# Patient Record
Sex: Female | Born: 1975 | Race: White | Hispanic: No | Marital: Married | Smoking: Former smoker
Health system: Southern US, Community
[De-identification: ages and names within clinical notes are randomized; demographics above are authoritative.]

## PROBLEM LIST (undated history)

## (undated) DIAGNOSIS — I5020 Unspecified systolic (congestive) heart failure: Secondary | ICD-10-CM

## (undated) DIAGNOSIS — F32A Depression, unspecified: Secondary | ICD-10-CM

## (undated) DIAGNOSIS — I471 Supraventricular tachycardia, unspecified: Secondary | ICD-10-CM

## (undated) DIAGNOSIS — F909 Attention-deficit hyperactivity disorder, unspecified type: Secondary | ICD-10-CM

## (undated) DIAGNOSIS — T7840XA Allergy, unspecified, initial encounter: Secondary | ICD-10-CM

## (undated) DIAGNOSIS — E119 Type 2 diabetes mellitus without complications: Secondary | ICD-10-CM

## (undated) DIAGNOSIS — F419 Anxiety disorder, unspecified: Secondary | ICD-10-CM

## (undated) DIAGNOSIS — F329 Major depressive disorder, single episode, unspecified: Secondary | ICD-10-CM

## (undated) DIAGNOSIS — I509 Heart failure, unspecified: Secondary | ICD-10-CM

## (undated) DIAGNOSIS — I10 Essential (primary) hypertension: Secondary | ICD-10-CM

## (undated) HISTORY — DX: Allergy, unspecified, initial encounter: T78.40XA

## (undated) HISTORY — DX: Unspecified systolic (congestive) heart failure: I50.20

## (undated) HISTORY — DX: Supraventricular tachycardia: I47.1

## (undated) HISTORY — DX: Anxiety disorder, unspecified: F41.9

## (undated) HISTORY — DX: Type 2 diabetes mellitus without complications: E11.9

## (undated) HISTORY — DX: Essential (primary) hypertension: I10

## (undated) HISTORY — DX: Attention-deficit hyperactivity disorder, unspecified type: F90.9

## (undated) HISTORY — DX: Depression, unspecified: F32.A

## (undated) HISTORY — DX: Major depressive disorder, single episode, unspecified: F32.9

---

## 2004-04-25 ENCOUNTER — Other Ambulatory Visit: Admission: RE | Admit: 2004-04-25 | Discharge: 2004-04-25 | Payer: Self-pay | Admitting: Gynecology

## 2004-11-06 ENCOUNTER — Encounter (INDEPENDENT_AMBULATORY_CARE_PROVIDER_SITE_OTHER): Payer: Self-pay | Admitting: Specialist

## 2004-11-06 ENCOUNTER — Inpatient Hospital Stay (HOSPITAL_COMMUNITY): Admission: AD | Admit: 2004-11-06 | Discharge: 2004-11-08 | Payer: Self-pay | Admitting: Gynecology

## 2005-01-03 ENCOUNTER — Other Ambulatory Visit: Admission: RE | Admit: 2005-01-03 | Discharge: 2005-01-03 | Payer: Self-pay | Admitting: Gynecology

## 2007-03-20 ENCOUNTER — Emergency Department (HOSPITAL_COMMUNITY): Admission: EM | Admit: 2007-03-20 | Discharge: 2007-03-20 | Payer: Self-pay | Admitting: Family Medicine

## 2012-01-13 ENCOUNTER — Ambulatory Visit: Payer: Self-pay | Admitting: Family Medicine

## 2012-01-13 VITALS — BP 133/81 | HR 67 | Temp 97.9°F | Resp 16 | Ht 59.58 in | Wt 188.6 lb

## 2012-01-13 DIAGNOSIS — H00039 Abscess of eyelid unspecified eye, unspecified eyelid: Secondary | ICD-10-CM

## 2012-01-13 DIAGNOSIS — H5789 Other specified disorders of eye and adnexa: Secondary | ICD-10-CM

## 2012-01-13 MED ORDER — SULFAMETHOXAZOLE-TRIMETHOPRIM 800-160 MG PO TABS: 1.0000 | ORAL_TABLET | Freq: Two times a day (BID) | ORAL | Status: DC

## 2012-01-13 NOTE — Progress Notes (Signed)
[facility]:  Office Visit  Chief Complaint:  Chief Complaint  Patient presents with  . Eye Problem    x 3 days  swelling/redness/pain/drainage/crusting in the am.   claritin x 2 days    HPI: Vickie Graham is a 36 y.o. female who complains of 3 day history of right eye redness of the upper eyelid only, swelling of upper eyelid, and soreness of eyelid. 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 She does have a h/o sinusitis and allergies. Took claritin without releif. SHe tried warm compresses without relief. There has been no drainage, she denies any fevers, chills, sinus tenderness, ear pain or signs of URI . She has full eye movement, no orbital or inner eye pain, no changes in vision, no photophobia or HAs.   Past Medical History  Diagnosis Date  . Allergy    History reviewed. No pertinent past surgical history. History   Social History  . Marital Status: Legally Separated    Spouse Name: N/A    Number of Children: N/A  . Years of Education: N/A   Social History Main Topics  . Smoking status: Former Smoker    Quit date: 01/12/2010  . Smokeless tobacco: None  . Alcohol Use: No  . Drug Use: No  . Sexually Active: None   Other Topics Concern  . None   Social History Narrative  . None   No family history on file. No Known Allergies Prior to Admission medications   Not on File     ROS: The patient denies fevers, chills, night sweats, unintentional weight loss, chest pain, palpitations, wheezing, dyspnea on exertion, nausea, vomiting, abdominal pain, dysuria, hematuria, melena, numbness, weakness, or tingling. + eye swelling  All other systems have been reviewed and were otherwise negative with the exception of those mentioned in the HPI and as above.    PHYSICAL EXAM: Filed Vitals:   01/13/12 1000  BP: 133/81  Pulse: 67  Temp: 97.9 F (36.6 C)  Resp: 16   Filed Vitals:   01/13/12 1000  Height: 4' 11.58" (1.513 m)  Weight: 188  lb 9.6 oz (85.548 kg)   Body mass index is 37.35 kg/(m^2).  General: Alert, no acute distress HEENT:  Normocephalic, atraumatic, oropharynx patent. EOMI, PERRLA, fundoscopic exam is normal, + red, swollen upper right eyelid, mildly tender Cardiovascular:  Regular rate and rhythm, no rubs murmurs or gallops.  No Carotid bruits, radial pulse intact. No pedal edema.  Respiratory: Clear to auscultation bilaterally.  No wheezes, rales, or rhonchi.  No cyanosis, no use of accessory musculature GI: No organomegaly, abdomen is soft and non-tender, positive bowel sounds.  No masses. Skin: No rashes. Neurologic: Facial musculature symmetric. Psychiatric: Patient is appropriate throughout our interaction. Lymphatic: No cervical lymphadenopathy Musculoskeletal: Gait intact.   LABS: No results found for this or any previous visit.   EKG/XRAY:   Primary read interpreted by Dr. Conley Rolls at [facility].   ASSESSMENT/PLAN: Encounter Diagnoses  Name Primary?  . Eyelid cellulitis Yes  . Eye swelling, right     ? Allergies vs  Less likely peri-orbital cellulitis Fluoroscein exam nl, opthalmic ofloxacin given at time of eye exam, flushed with sterile saline Patient advise to take claritin, Bactrim was given for potential cellulitis F/u in 1 day if no improvement Patient was advised that this is an emergency case if she starts having worsening redness, pain, loss/change in vision   Jaszmine Navejas PHUONG, DO 01/13/2012 10:42 AM

## 2012-01-14 ENCOUNTER — Telehealth: Payer: Self-pay | Admitting: Family Medicine

## 2012-01-14 NOTE — Telephone Encounter (Signed)
Patient is doing about the same, no changes. No fevers, chill, increase redness, pain or dx or vision changes. Will start on antihistamine eye drops OTC.

## 2013-05-28 ENCOUNTER — Ambulatory Visit: Payer: Self-pay | Admitting: Physician Assistant

## 2013-05-28 ENCOUNTER — Encounter: Payer: Self-pay | Admitting: Physician Assistant

## 2013-05-28 VITALS — BP 118/70 | HR 60 | Temp 98.1°F | Resp 16 | Ht 59.5 in | Wt 193.4 lb

## 2013-05-28 DIAGNOSIS — Z0289 Encounter for other administrative examinations: Secondary | ICD-10-CM

## 2013-05-28 NOTE — Progress Notes (Signed)
  Subjective:    Patient ID: Vickie Graham, female    DOB: 1975/11/19, 37 y.o.   MRN: [id number]  HPI 37 year old female presents for employment physical and TB read.  Had TB test placed at [facility] on 05/26/13 at 6:30 p.m.  Was unable to get it read tonight at the [facility] due to the fact they were closing.  She is healthy with no known medical problems.  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  She is not on any daily medications and has not under treatment for any physical or emotional conditions. Last tetanus 8 years ago.   Patient is otherwise doing well with no other concerns today.      Review of Systems  HENT: Negative.   Respiratory: Negative.   Cardiovascular: Negative.   Gastrointestinal: Negative.   Endocrine: Negative.   Genitourinary: Negative.   Neurological: Negative.        Objective:   Physical Exam  Constitutional: She is oriented to person, place, and time. She appears well-developed and well-nourished.  HENT:  Head: Normocephalic and atraumatic.  Right Ear: Hearing, tympanic membrane, external ear and ear canal normal.  Left Ear: Hearing, tympanic membrane, external ear and ear canal normal.  Mouth/Throat: Uvula is midline, oropharynx is clear and moist and mucous membranes are normal. No oropharyngeal exudate.  Eyes: Conjunctivae and EOM are normal. Pupils are equal, round, and reactive to light.  Neck: Normal range of motion. Neck supple. No thyromegaly present.  Cardiovascular: Normal rate, regular rhythm and normal heart sounds.   Pulmonary/Chest: Effort normal and breath sounds normal.  Abdominal: Soft. Bowel sounds are normal. There is no tenderness.  Musculoskeletal:       Right shoulder: Normal.       Left shoulder: Normal.       Right knee: Normal.       Left knee: Normal.       Thoracic back: Normal.       Lumbar back: Normal.  Lymphadenopathy:    She has no cervical adenopathy.  Neurological: She  is alert and oriented to person, place, and time.  Reflex Scores:      Patellar reflexes are 2+ on the right side and 2+ on the left side. Psychiatric: She has a normal mood and affect. Her behavior is normal. Judgment and thought content normal.          Assessment & Plan:  Health examination of defined subpopulation Patient healthy and able to care for children.  Forms completed.  TB test read as negative at 0.00 mm  Follow up as needed.

## 2013-07-26 ENCOUNTER — Ambulatory Visit: Payer: Self-pay | Admitting: Internal Medicine

## 2013-07-26 VITALS — BP 116/74 | HR 54 | Temp 97.5°F | Resp 16 | Ht 61.0 in | Wt 195.4 lb

## 2013-07-26 DIAGNOSIS — R059 Cough, unspecified: Secondary | ICD-10-CM

## 2013-07-26 DIAGNOSIS — J209 Acute bronchitis, unspecified: Secondary | ICD-10-CM

## 2013-07-26 DIAGNOSIS — R05 Cough: Secondary | ICD-10-CM

## 2013-07-26 MED ORDER — AZITHROMYCIN 250 MG PO TABS: ORAL_TABLET | ORAL | Status: DC

## 2013-07-26 MED ORDER — HYDROCODONE-HOMATROPINE 5-1.5 MG/5ML PO SYRP: 5.0000 mL | ORAL_SOLUTION | Freq: Four times a day (QID) | ORAL | Status: DC | PRN

## 2013-07-26 NOTE — Progress Notes (Signed)
  Subjective:    Patient ID: ALEJANDRO ADCOX, female    DOB: 1976/04/14, 37 y.o.   MRN: [id number]  HPI Productive cough for 1 week No fever//?low grade at night No AR sxt Fatigue Never wheezer   Review of Systems     Objective:   Physical Exam BP 116/74  Pulse 54  Temp(Src) 97.5 F (36.4 C) (Oral)  Resp 16  Ht 5\' 1"  (1.549 m)  Wt 195 lb 6.4 oz (88.633 kg)  BMI 36.94 kg/m2  SpO2 96%  LMP 07/20/2013 Perrla/conj sl inj Nares boggy/no disch thr sl red no exud No nodes Lungs with rhonchi bilat/clear w/ cough-no wheeze       Assessment & Plan:  Cough 2 to LRI  Meds ordered this encounter  Medications  . HYDROcodone-homatropine (HYCODAN) 5-1.5 MG/5ML syrup    Sig: Take 5 mLs by mouth every 6 (six) hours as needed for cough.    Dispense:  120 mL    Refill:  0  . azithromycin (ZITHROMAX) 250 MG tablet    Sig: As packaged    Dispense:  6 tablet    Refill:  0

## 2013-12-13 ENCOUNTER — Ambulatory Visit: Payer: Self-pay | Admitting: Internal Medicine

## 2013-12-13 VITALS — BP 112/66 | HR 84 | Temp 97.6°F | Resp 16 | Ht 60.0 in | Wt 206.0 lb

## 2013-12-13 DIAGNOSIS — R5383 Other fatigue: Principal | ICD-10-CM

## 2013-12-13 DIAGNOSIS — R5381 Other malaise: Secondary | ICD-10-CM

## 2013-12-13 DIAGNOSIS — G4700 Insomnia, unspecified: Secondary | ICD-10-CM

## 2013-12-13 LAB — POCT UA - MICROSCOPIC ONLY
Casts, Ur, LPF, POC: NEGATIVE
Crystals, Ur, HPF, POC: NEGATIVE
Mucus, UA: NEGATIVE
RBC, URINE, MICROSCOPIC: NEGATIVE
Yeast, UA: NEGATIVE

## 2013-12-13 LAB — COMPREHENSIVE METABOLIC PANEL
ALBUMIN: 3.9 g/dL (ref 3.5–5.2)
ALT: 16 U/L (ref 0–35)
AST: 15 U/L (ref 0–37)
Alkaline Phosphatase: 70 U/L (ref 39–117)
BUN: 19 mg/dL (ref 6–23)
CO2: 26 meq/L (ref 19–32)
Calcium: 9.5 mg/dL (ref 8.4–10.5)
Chloride: 105 mEq/L (ref 96–112)
Creat: 0.86 mg/dL (ref 0.50–1.10)
GLUCOSE: 89 mg/dL (ref 70–99)
POTASSIUM: 4.6 meq/L (ref 3.5–5.3)
SODIUM: 141 meq/L (ref 135–145)
TOTAL PROTEIN: 6.5 g/dL (ref 6.0–8.3)
Total Bilirubin: 0.4 mg/dL (ref 0.2–1.2)

## 2013-12-13 LAB — POCT CBC
GRANULOCYTE PERCENT: 66.1 % (ref 37–80)
HCT, POC: 46.9 % (ref 37.7–47.9)
HEMOGLOBIN: 15 g/dL (ref 12.2–16.2)
Lymph, poc: 2.9 (ref 0.6–3.4)
MCH: 28.6 pg (ref 27–31.2)
MCHC: 32 g/dL (ref 31.8–35.4)
MCV: 89.4 fL (ref 80–97)
MID (CBC): 0.6 (ref 0–0.9)
MPV: 13.6 fL (ref 0–99.8)
PLATELET COUNT, POC: 188 10*3/uL (ref 142–424)
POC Granulocyte: 6.9 (ref 2–6.9)
POC LYMPH PERCENT: 28 %L (ref 10–50)
POC MID %: 5.9 %M (ref 0–12)
RBC: 5.25 M/uL (ref 4.04–5.48)
RDW, POC: 14.8 %
WBC: 10.4 10*3/uL — AB (ref 4.6–10.2)

## 2013-12-13 LAB — POCT URINALYSIS DIPSTICK
BILIRUBIN UA: NEGATIVE
GLUCOSE UA: NEGATIVE
Leukocytes, UA: NEGATIVE
NITRITE UA: NEGATIVE
Protein, UA: NEGATIVE
RBC UA: NEGATIVE
SPEC GRAV UA: 1.025
UROBILINOGEN UA: 0.2
pH, UA: 5.5

## 2013-12-13 LAB — TSH: TSH: 2.166 u[IU]/mL (ref 0.350–4.500)

## 2013-12-13 NOTE — Progress Notes (Signed)
Subjective:  This chart was scribed for Tami Lin, MD by Roxan Diesel, Scribe.  This patient was seen in [facility] and the patient's care was started at 2:34 PM.   Patient ID: Vickie Graham, female    DOB: 03-05-1976, 38 y.o.   MRN: [id number]  HPI  HPI Comments: Vickie Graham is a 38 y.o. female who presents to [facility] complaining of fatigue that began one week ago.  Pt states she has been sleeping a large amount (14 hours last night) but is persistently very tired during the day.  She denies any other recent illness or associated symptoms including cold symptoms, cough, sore throat, dental problems, rash, leg swelling, appetite change, night sweats, SOB, or changes to bladder or bowel function. No snoring or sleep dysfunction. No daytime hypersomnolence.  Pt has had no lab work done in some time.  She denies h/o thyroid disorder.  She takes no medications regularly.    LNMP was 1 week ago and was normal.  She states the first 2 days of her periods are "pretty heavy" but it then slows down.  She admits to h/o post-partum anemia but no other anemia.  She is not sexually active.  She admits to h/o environmental allergy symptoms in the spring but has had none so far this year.  Pt works in childcare.   There are no active problems to display for this patient.   Past Medical History  Diagnosis Date   Allergy     History reviewed. No pertinent past surgical history.   Prior to Admission medications   Not on File      Review of Systems  Constitutional: Positive for fatigue. Negative for fever, chills, diaphoresis, activity change and appetite change.       Has continued to work in [place]: Negative for dental problem, mouth sores, rhinorrhea, sore throat and trouble swallowing.   Eyes: Negative for discharge.  Respiratory: Negative for cough, shortness of breath and wheezing.   Cardiovascular: Negative for chest pain, palpitations and leg swelling.    Gastrointestinal: Negative for nausea, vomiting and diarrhea.       One episode of dyspepsia one week ago without nausea or vomiting Resolve without treatment  Endocrine: Negative for cold intolerance and heat intolerance.  Genitourinary: Negative for dysuria, urgency, frequency, hematuria, decreased urine volume, enuresis, difficulty urinating and menstrual problem.  Musculoskeletal: Negative for back pain, joint swelling and myalgias.  Skin: Negative for rash.  Neurological: Negative for headaches.  Psychiatric/Behavioral: Negative for sleep disturbance.         Objective:   Physical Exam  Nursing note and vitals reviewed. Constitutional: She is oriented to person, place, and time. She appears well-developed and well-nourished. No distress.  HENT:  Head: Normocephalic and atraumatic.  Mouth/Throat: Oropharynx is clear and moist. Normal dentition. No dental abscesses. No oropharyngeal exudate, posterior oropharyngeal edema or posterior oropharyngeal erythema.  Eyes: Conjunctivae and EOM are normal. Pupils are equal, round, and reactive to light.  Neck: Neck supple. No tracheal deviation present. No thyromegaly present.  Cardiovascular: Normal rate, regular rhythm and normal heart sounds.   No murmur heard. Pulmonary/Chest: Effort normal and breath sounds normal. No respiratory distress. She has no wheezes.  Abdominal: Soft. There is no splenomegaly or hepatomegaly. There is no tenderness.  Musculoskeletal: Normal range of motion.  Lymphadenopathy:    She has no cervical adenopathy.  Neurological: She is alert and oriented to person, place, and time.  Skin: Skin is  warm and dry.  Psychiatric: She has a normal mood and affect. Her behavior is normal.     BP 112/66   Pulse 84   Temp(Src) 97.6 F (36.4 C)   Resp 16   Ht 5' (1.524 m)   Wt 206 lb (93.441 kg)   BMI 40.23 kg/m2   SpO2 96%   LMP 12/05/2013   Results for orders placed in visit on 12/13/13  POCT CBC      Result  Value Ref Range   WBC 10.4 (*) 4.6 - 10.2 K/uL   Lymph, poc 2.9  0.6 - 3.4   POC LYMPH PERCENT 28.0  10 - 50 %L   MID (cbc) 0.6  0 - 0.9   POC MID % 5.9  0 - 12 %M   POC Granulocyte 6.9  2 - 6.9   Granulocyte percent 66.1  37 - 80 %G   RBC 5.25  4.04 - 5.48 M/uL   Hemoglobin 15.0  12.2 - 16.2 g/dL   HCT, POC 46.9  37.7 - 47.9 %   MCV 89.4  80 - 97 fL   MCH, POC 28.6  27 - 31.2 pg   MCHC 32.0  31.8 - 35.4 g/dL   RDW, POC 14.8     Platelet Count, POC 188  142 - 424 K/uL   MPV 13.6  0 - 99.8 fL  POCT URINALYSIS DIPSTICK      Result Value Ref Range   Color, UA bright yellow     Clarity, UA clear     Glucose, UA neg     Bilirubin, UA neg     Ketones, UA trace     Spec Grav, UA 1.025     Blood, UA neg     pH, UA 5.5     Protein, UA neg     Urobilinogen, UA 0.2     Nitrite, UA neg     Leukocytes, UA Negative    POCT UA - MICROSCOPIC ONLY      Result Value Ref Range   WBC, Ur, HPF, POC 0-1     RBC, urine, microscopic neg     Bacteria, U Microscopic trace     Mucus, UA neg     Epithelial cells, urine per micros 0-1     Crystals, Ur, HPF, POC neg     Casts, Ur, LPF, POC neg     Yeast, UA neg           Assessment & Plan:  Other malaise and fatigue - Plan: POCT CBC, POCT urinalysis dipstick, POCT UA - Microscopic Only, Comprehensive metabolic panel, TSH  Severe obesity (BMI >= 40)    Call w/ other labs and plan--if all okay we'll wait 3 weeks before further workup  I have completed the patient encounter in its entirety as documented by the scribe, with editing by me where necessary. Robert P. Laney Pastor, M.D.

## 2014-02-04 ENCOUNTER — Ambulatory Visit: Payer: Self-pay

## 2014-02-06 ENCOUNTER — Ambulatory Visit: Payer: Self-pay | Admitting: Family Medicine

## 2014-02-06 VITALS — BP 126/78 | HR 79 | Temp 98.0°F | Resp 14 | Ht 59.5 in | Wt 211.6 lb

## 2014-02-06 DIAGNOSIS — I499 Cardiac arrhythmia, unspecified: Secondary | ICD-10-CM

## 2014-02-06 DIAGNOSIS — R609 Edema, unspecified: Secondary | ICD-10-CM

## 2014-02-06 DIAGNOSIS — R5381 Other malaise: Secondary | ICD-10-CM

## 2014-02-06 DIAGNOSIS — R5383 Other fatigue: Secondary | ICD-10-CM

## 2014-02-06 DIAGNOSIS — D72829 Elevated white blood cell count, unspecified: Secondary | ICD-10-CM

## 2014-02-06 DIAGNOSIS — R601 Generalized edema: Secondary | ICD-10-CM

## 2014-02-06 LAB — POCT URINALYSIS DIPSTICK
Bilirubin, UA: NEGATIVE
Blood, UA: NEGATIVE
Glucose, UA: NEGATIVE
Ketones, UA: 40
LEUKOCYTES UA: NEGATIVE
Nitrite, UA: NEGATIVE
PH UA: 5
PROTEIN UA: NEGATIVE
Spec Grav, UA: 1.015
Urobilinogen, UA: 0.2

## 2014-02-06 LAB — POCT CBC
Granulocyte percent: 65.9 %G (ref 37–80)
HEMATOCRIT: 46.5 % (ref 37.7–47.9)
HEMOGLOBIN: 15.1 g/dL (ref 12.2–16.2)
LYMPH, POC: 3 (ref 0.6–3.4)
MCH: 28.8 pg (ref 27–31.2)
MCHC: 32.5 g/dL (ref 31.8–35.4)
MCV: 88.6 fL (ref 80–97)
MID (cbc): 0.9 (ref 0–0.9)
MPV: 10.7 fL (ref 0–99.8)
POC Granulocyte: 7.6 — AB (ref 2–6.9)
POC LYMPH PERCENT: 26.5 %L (ref 10–50)
POC MID %: 7.6 %M (ref 0–12)
Platelet Count, POC: 240 10*3/uL (ref 142–424)
RBC: 5.25 M/uL (ref 4.04–5.48)
RDW, POC: 16.1 %
WBC: 11.5 10*3/uL — AB (ref 4.6–10.2)

## 2014-02-06 MED ORDER — FUROSEMIDE 20 MG PO TABS: 20.0000 mg | ORAL_TABLET | Freq: Every day | ORAL | Status: DC

## 2014-02-06 NOTE — Patient Instructions (Addendum)
Your blood count was borderline elevated, but no apparent signs of infection otherwise. If you have any fever, chest pain, cough, or new urinary symptoms - return for recheck here or emergency room. EKG had a few extra beats, but no apparent concerning findings at this time. You have some other blood tests pending. You should receive a call or letter about your lab results within the next week to 10 days.   For now - make sure you are staying hydrated throughout the day, low salt diet (including frozen foods and restaurant foods), and recheck in next week. Lasix for next 2 days may help to lessen swelling. Return sooner if any worsening of the swelling, any heart palpitations, dizziness, or any new/worsening symptoms. Return to the clinic or go to the nearest emergency room if any of your symptoms worsen or new symptoms occur.    Peripheral Edema You have swelling in your legs (peripheral edema). This swelling is due to excess accumulation of salt and water in your body. Edema may be a sign of heart, kidney or liver disease, or a side effect of a medication. It may also be due to problems in the leg veins. Elevating your legs and using special support stockings may be very helpful, if the cause of the swelling is due to poor venous circulation. Avoid long periods of standing, whatever the cause. Treatment of edema depends on identifying the cause. Chips, pretzels, pickles and other salty foods should be avoided. Restricting salt in your diet is almost always needed. Water pills (diuretics) are often used to remove the excess salt and water from your body via urine. These medicines prevent the kidney from reabsorbing sodium. This increases urine flow. Diuretic treatment may also result in lowering of potassium levels in your body. Potassium supplements may be needed if you have to use diuretics daily. Daily weights can help you keep track of your progress in clearing your edema. You should call your caregiver  for follow up care as recommended. SEEK IMMEDIATE MEDICAL CARE IF:   You have increased swelling, pain, redness, or heat in your legs.  You develop shortness of breath, especially when lying down.  You develop chest or abdominal pain, weakness, or fainting.  You have a fever. Document Released: 10/11/2004 Document Revised: 11/26/2011 Document Reviewed: 09/21/2009 [facility] Patient Information 2014 [place].

## 2014-02-06 NOTE — Progress Notes (Addendum)
Subjective:    Patient ID: Vickie Graham, female    DOB: 02-May-1976, 38 y.o.   MRN: [id number]  This chart was scribed for Merri Ray, MD by Erling Conte, Medical Scribe. This patient was seen in Room 1 and the patient's care was started at 4:36 PM.  Chief Complaint  Patient presents with   Edema    fingers, knees, ankles, wrists, shoulders x 1 week      HPI  HPI Comments:  Vickie Graham is a 38 y.o. female who presents to the [facility] complaining of swelling in her fingers, knees, ankles, wrists and shoulders for one week. Patient states that swelling started in her fingers and has gradually progressed to other areas of the body. She reports that the swelling in her fingers became so bad she could no longer wear her rings. She states she has associated soreness in her left shoulder and knee pain, she also states she had some fatigue initially but that has resolved. She reports that she was riding her bike about 2 weeks ago when she developed some knee pain, she stated that the knee pain continued until she began having the swelling 1 week ago.  Patient states she has never had this problem before. She denies any history of heart problems. Patient reports that she takes OTC magnesium, fish oil, Vitamin E and chromium picolinate supplements. 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 Patient denies any swelling in her face, chest pain, chest tightness, dysuria, diffculty urinating or shortness of breath.  Weight is up 5lbs from last visit  No change in diet. States she has been on a [employer] for 5 years. Stopped drinking coffee 1 year ago  Last seen by Dr. Laney Pastor on 12/13/13, Fatigue noted at that time but no swelling. UA was negative for protein. TSH normal. CMP normal. CBC normal except borderline WBC at 10.4   Patient Active Problem List   Diagnosis Date  Noted   Severe obesity (BMI >= 40) 12/13/2013   Past Medical History  Diagnosis Date   Allergy    History reviewed. No pertinent past surgical history. No Known Allergies Prior to Admission medications   Not on File   History   Social History   Marital Status: Legally Separated    Spouse Name: N/A    Number of Children: N/A   Years of Education: N/A   Occupational History   Not on file.   Social History Main Topics   Smoking status: Former Smoker    Quit date: 01/12/2010   Smokeless tobacco: Not on file   Alcohol Use: No   Drug Use: No   Sexual Activity: Not on file   Other Topics Concern   Not on file   Social History Narrative   No narrative on file       Review of Systems  Constitutional: Positive for fatigue (resolved).  HENT: Negative for facial swelling.   Respiratory: Negative for chest tightness and shortness of breath.   Cardiovascular: Positive for leg swelling. Negative for chest pain.  Genitourinary: Negative for dysuria and difficulty urinating.  Musculoskeletal: Positive for arthralgias (bilateral knees and left shoulders) and joint swelling (fingers, knees, wrists).  All other systems reviewed and are negative.      Objective:   Physical Exam  Nursing note and vitals reviewed. Constitutional: She is  oriented to person, place, and time. She appears well-developed and well-nourished. No distress.  HENT:  Head: Normocephalic and atraumatic.  Eyes: EOM are normal.  Neck: Neck supple. No tracheal deviation present.  Cardiovascular: Exam reveals no gallop.   No murmur heard. Every 3-6 beats she has a PVC, followed by slight pause. Heart sounds are distant but no apparent gallop or murmur Peripheral pulses are equal and intact  Pulmonary/Chest: Effort normal. No respiratory distress.  Musculoskeletal: Normal range of motion. She exhibits edema.  1+ edema to superior tibia bilaterally Calves are non tender  Neurological: She is  alert and oriented to person, place, and time.  Skin: Skin is warm and dry.  Psychiatric: She has a normal mood and affect. Her behavior is normal.    Filed Vitals:   02/06/14 1531  BP: 126/78  Pulse: 79  Temp: 98 F (36.7 C)  TempSrc: Oral  Resp: 14  Height: 4' 11.5" (1.511 m)  Weight: 211 lb 9.6 oz (95.981 kg)  SpO2: 97%   EKG: sr, frequent PAC's, nonspecific t waves in III and AVF.   Results for orders placed in visit on 02/06/14  POCT CBC      Result Value Ref Range   WBC 11.5 (*) 4.6 - 10.2 K/uL   Lymph, poc 3.0  0.6 - 3.4   POC LYMPH PERCENT 26.5  10 - 50 %L   MID (cbc) 0.9  0 - 0.9   POC MID % 7.6  0 - 12 %M   POC Granulocyte 7.6 (*) 2 - 6.9   Granulocyte percent 65.9  37 - 80 %G   RBC 5.25  4.04 - 5.48 M/uL   Hemoglobin 15.1  12.2 - 16.2 g/dL   HCT, POC 46.5  37.7 - 47.9 %   MCV 88.6  80 - 97 fL   MCH, POC 28.8  27 - 31.2 pg   MCHC 32.5  31.8 - 35.4 g/dL   RDW, POC 16.1     Platelet Count, POC 240  142 - 424 K/uL   MPV 10.7  0 - 99.8 fL  POCT URINALYSIS DIPSTICK      Result Value Ref Range   Color, UA yellow     Clarity, UA clear     Glucose, UA neg     Bilirubin, UA neg     Ketones, UA 40     Spec Grav, UA 1.015     Blood, UA neg     pH, UA 5.0     Protein, UA neg     Urobilinogen, UA 0.2     Nitrite, UA neg     Leukocytes, UA Negative          Assessment & Plan:   Vickie Graham is a 38 y.o. female Irregular heart beat - Plan: POCT CBC, POCT urinalysis dipstick, Basic metabolic panel, TSH, Magnesium, EKG 12-Lead  - pac's noted.  Asymptomatic.  Maintain adequate hydration, and if any palpitations or sx's - rtc/er precautions.   Peripheral edema, Fatigue - Plan: POCT CBC, POCT urinalysis dipstick, Basic metabolic panel, TSH, Magnesium  -DDx includes edema with heat, vs electrolyte vs. Thyroid, but prior labs ok - will recheck. By hx, less likely diet related, but recommended low salt diet. No doe/orthopnea/chest pain/palpitations, so doubt  cardiac, but consider echo if persists. furosemide (LASIX) 20 MG tablet qd for 2 days, and recheck in next 1 week -sooner if worse.   Leukocytosis, unspecified  -afebrile and no s/sx's of  infection at this point. Recheck levels in next 1 week, sooner if sx's as below.   Meds ordered this encounter  Medications   furosemide (LASIX) 20 MG tablet    Sig: Take 1 tablet (20 mg total) by mouth daily.    Dispense:  2 tablet    Refill:  0   Patient Instructions  Your blood count was borderline elevated, but no apparent signs of infection otherwise. If you have any fever, chest pain, cough, or new urinary symptoms - return for recheck here or emergency room. EKG had a few extra beats, but no apparent concerning findings at this time. You have some other blood tests pending. You should receive a call or letter about your lab results within the next week to 10 days.   For now - make sure you are staying hydrated throughout the day, low salt diet (including frozen foods and restaurant foods), and recheck in next week. Lasix for next 2 days may help to lessen swelling. Return sooner if any worsening of the swelling, any heart palpitations, dizziness, or any new/worsening symptoms. Return to the clinic or go to the nearest emergency room if any of your symptoms worsen or new symptoms occur.    Peripheral Edema You have swelling in your legs (peripheral edema). This swelling is due to excess accumulation of salt and water in your body. Edema may be a sign of heart, kidney or liver disease, or a side effect of a medication. It may also be due to problems in the leg veins. Elevating your legs and using special support stockings may be very helpful, if the cause of the swelling is due to poor venous circulation. Avoid long periods of standing, whatever the cause. Treatment of edema depends on identifying the cause. Chips, pretzels, pickles and other salty foods should be avoided. Restricting salt in your diet is  almost always needed. Water pills (diuretics) are often used to remove the excess salt and water from your body via urine. These medicines prevent the kidney from reabsorbing sodium. This increases urine flow. Diuretic treatment may also result in lowering of potassium levels in your body. Potassium supplements may be needed if you have to use diuretics daily. Daily weights can help you keep track of your progress in clearing your edema. You should call your caregiver for follow up care as recommended. SEEK IMMEDIATE MEDICAL CARE IF:   You have increased swelling, pain, redness, or heat in your legs.  You develop shortness of breath, especially when lying down.  You develop chest or abdominal pain, weakness, or fainting.  You have a fever. Document Released: 10/11/2004 Document Revised: 11/26/2011 Document Reviewed: 09/21/2009 [facility] Patient Information 2014 [place].

## 2014-02-07 LAB — MAGNESIUM: Magnesium: 1.9 mg/dL (ref 1.5–2.5)

## 2014-02-07 LAB — BASIC METABOLIC PANEL
BUN: 18 mg/dL (ref 6–23)
CALCIUM: 9.6 mg/dL (ref 8.4–10.5)
CO2: 25 meq/L (ref 19–32)
CREATININE: 0.95 mg/dL (ref 0.50–1.10)
Chloride: 101 mEq/L (ref 96–112)
Glucose, Bld: 88 mg/dL (ref 70–99)
Potassium: 4.3 mEq/L (ref 3.5–5.3)
Sodium: 139 mEq/L (ref 135–145)

## 2014-02-07 LAB — TSH: TSH: 4.07 u[IU]/mL (ref 0.350–4.500)

## 2014-08-16 ENCOUNTER — Ambulatory Visit (INDEPENDENT_AMBULATORY_CARE_PROVIDER_SITE_OTHER): Payer: Self-pay | Admitting: Family Medicine

## 2014-08-16 VITALS — BP 122/78 | HR 57 | Temp 97.8°F | Resp 16 | Ht 60.0 in | Wt 205.8 lb

## 2014-08-16 DIAGNOSIS — J988 Other specified respiratory disorders: Secondary | ICD-10-CM

## 2014-08-16 DIAGNOSIS — J22 Unspecified acute lower respiratory infection: Secondary | ICD-10-CM

## 2014-08-16 DIAGNOSIS — I493 Ventricular premature depolarization: Secondary | ICD-10-CM

## 2014-08-16 DIAGNOSIS — J069 Acute upper respiratory infection, unspecified: Secondary | ICD-10-CM

## 2014-08-16 DIAGNOSIS — R0981 Nasal congestion: Secondary | ICD-10-CM

## 2014-08-16 MED ORDER — HYDROCODONE-HOMATROPINE 5-1.5 MG/5ML PO SYRP: 5.0000 mL | ORAL_SOLUTION | Freq: Every evening | ORAL | Status: DC | PRN

## 2014-08-16 MED ORDER — AZITHROMYCIN 250 MG PO TABS: ORAL_TABLET | ORAL | Status: DC

## 2014-08-16 NOTE — Patient Instructions (Addendum)
Acute Bronchitis Bronchitis is inflammation of the airways that extend from the windpipe into the lungs (bronchi). The inflammation often causes mucus to develop. This leads to a cough, which is the most common symptom of bronchitis.  In acute bronchitis, the condition usually develops suddenly and goes away over time, usually in a couple weeks. Smoking, allergies, and asthma can make bronchitis worse. Repeated episodes of bronchitis may cause further lung problems.  CAUSES Acute bronchitis is most often caused by the same virus that causes a cold. The virus can spread from person to person (contagious) through coughing, sneezing, and touching contaminated objects. SIGNS AND SYMPTOMS   Cough.   Fever.   Coughing up mucus.   Body aches.   Chest congestion.   Chills.   Shortness of breath.   Sore throat.  DIAGNOSIS  Acute bronchitis is usually diagnosed through a physical exam. Your health care provider will also ask you questions about your medical history. Tests, such as chest X-rays, are sometimes done to rule out other conditions.  TREATMENT  Acute bronchitis usually goes away in a couple weeks. Oftentimes, no medical treatment is necessary. Medicines are sometimes given for relief of fever or cough. Antibiotic medicines are usually not needed but may be prescribed in certain situations. In some cases, an inhaler may be recommended to help reduce shortness of breath and control the cough. A cool mist vaporizer may also be used to help thin bronchial secretions and make it easier to clear the chest.  HOME CARE INSTRUCTIONS  Get plenty of rest.   Drink enough fluids to keep your urine clear or pale yellow (unless you have a medical condition that requires fluid restriction). Increasing fluids may help thin your respiratory secretions (sputum) and reduce chest congestion, and it will prevent dehydration.   Take medicines only as directed by your health care provider.  If  you were prescribed an antibiotic medicine, finish it all even if you start to feel better.  Avoid smoking and secondhand smoke. Exposure to cigarette smoke or irritating chemicals will make bronchitis worse. If you are a smoker, consider using nicotine gum or skin patches to help control withdrawal symptoms. Quitting smoking will help your lungs heal faster.   Reduce the chances of another bout of acute bronchitis by washing your hands frequently, avoiding people with cold symptoms, and trying not to touch your hands to your mouth, nose, or eyes.   Keep all follow-up visits as directed by your health care provider.  SEEK MEDICAL CARE IF: Your symptoms do not improve after 1 week of treatment.  SEEK IMMEDIATE MEDICAL CARE IF:  You develop an increased fever or chills.   You have chest pain.   You have severe shortness of breath.  You have bloody sputum.   You develop dehydration.  You faint or repeatedly feel like you are going to pass out.  You develop repeated vomiting.  You develop a severe headache. MAKE SURE YOU:   Understand these instructions.  Will watch your condition.  Will get help right away if you are not doing well or get worse. Document Released: 10/11/2004 Document Revised: 01/18/2014 Document Reviewed: 02/24/2013 [facility] Patient Information 2015 [place], [place]. This information is not intended to replace advice given to you by your health care provider. Make sure you discuss any questions you have with your health care provider. Premature Ventricular Contraction Premature ventricular contraction (PVC) is an irregularity of the heart rhythm involving extra or skipped heartbeats. In some cases, they may  occur without obvious cause or heart disease. Other times, they can be caused by an electrolyte change in the blood. These need to be corrected. They can also be seen when there is not enough oxygen going to the heart. A common cause of this is plaque or  cholesterol buildup. This buildup decreases the blood supply to the heart. In addition, extra beats may be caused or aggravated by:  Excessive smoking.  Alcohol consumption.  Caffeine.  Certain medications  Some street drugs. SYMPTOMS   The sensation of feeling your heart skipping a beat (palpitations).  In many cases, the person may have no symptoms. SIGNS AND TESTS   A physical examination may show an occasional irregularity, but if the PVC beats do not happen often, they may not be found on physical exam.  Blood pressure is usually normal.  Other tests that may find extra beats of the heart are:  An EKG (electrocardiogram)  A Holter monitor which can monitor your heart over longer periods of time  An Angiogram (study of the heart arteries). TREATMENT  Usually extra heartbeats do not need treatment. The condition is treated only if symptoms are severe or if extra beats are very frequent or are causing problems. An underlying cause, if discovered, may also require treatment.  Treatment may also be needed if there may be a risk for other more serious cardiac arrhythmias.  PREVENTION   Moderation in caffeine, alcohol, and tobacco use may reduce the risk of ectopic heartbeats in some people.  Exercise often helps people who lead a sedentary (inactive) lifestyle. PROGNOSIS  PVC heartbeats are generally harmless and do not need treatment.  RISKS AND COMPLICATIONS   Ventricular tachycardia (occasionally).  There usually are no complications.  Other arrhythmias (occasionally). SEEK IMMEDIATE MEDICAL CARE IF:   You feel palpitations that are frequent or continual.  You develop chest pain or other problems such as shortness of breath, sweating, or nausea and vomiting.  You become light-headed or faint (pass out).  You get worse or do not improve with treatment. Document Released: 04/20/2004 Document Revised: 11/26/2011 Document Reviewed: 10/31/2007 [facility] Patient  Information 2015 [place], [place]. This information is not intended to replace advice given to you by your health care provider. Make sure you discuss any questions you have with your health care provider.

## 2014-08-18 NOTE — Progress Notes (Signed)
Chief Complaint:  Chief Complaint  Patient presents with  . Sore Throat    x3 week; was seen at another Urgent Care and given amoxicillin  . Sinusitis  . Generalized Body Aches  . Chills  . Headache  . chest congestion    HPI: Vickie Graham is a 38 y.o. female who is here for  3 week hx of sore throat, sinus xs, generalized aches and also sinus headache, chest congestion, she was given amoxacillinw ithout releif Has allergies, no SOB, no wheezes. She has taken otc cough meds, she works in a daycare center. She has had chills  She has a hx of PVCs and was seen for this but has no insurance to go see cardiologist, she states she does not tant to get a repeat EKG for confirmation if I already hear it, and she will try to get insurance to see cardiology . No CP.,SOB   PLease see Dr Vonna Kotyk last OV Vickie Graham is a 38 y.o. female who presents to the [facility] complaining of swelling in her fingers, knees, ankles, wrists and shoulders for one week. Patient states that swelling started in her fingers and has gradually progressed to other areas of the body. She reports that the swelling in her fingers became so bad she could no longer wear her rings. She states she has associated soreness in her left shoulder and knee pain, she also states she had some fatigue initially but that has resolved. She reports that she was riding her bike about 2 weeks ago when she developed some knee pain, she stated that the knee pain continued until she began having the swelling 1 week ago. Patient states she has never had this problem before. She denies any history of heart problems. Patient reports that she takes OTC magnesium, fish oil, Vitamin E and chromium picolinate supplements. 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 Patient denies any swelling in her face, chest pain, chest  tightness, dysuria, diffculty urinating or shortness of breath.  Weight is up 5lbs from last visit  No change in diet. States she has been on a [employer] for 5 years. Stopped drinking coffee 1 year ago  Last seen by Dr. Laney Pastor on 12/13/13, Fatigue noted at that time but no swelling. UA was negative for protein. TSH normal. CMP normal. CBC normal except borderline WBC at 10.4  Past Medical History  Diagnosis Date  . Allergy    History reviewed. No pertinent past surgical history. History   Social History  . Marital Status: Legally Separated    Spouse Name: N/A    Number of Children: N/A  . Years of Education: N/A   Social History Main Topics  . Smoking status: Former Smoker    Quit date: 01/12/2010  . Smokeless tobacco: None  . Alcohol Use: No  . Drug Use: No  . Sexual Activity: None   Other Topics Concern  . None   Social History Narrative   History reviewed. No pertinent family history. No Known Allergies Prior to Admission medications   Medication Sig Start Date End Date Taking? Authorizing Provider  azithromycin (ZITHROMAX) 250 MG tablet Take 2 tabs po now then 1 tab po daily for the next 4 days. 08/16/14   Rithika Seel P Alexas Basulto, DO  HYDROcodone-homatropine (HYCODAN) 5-1.5 MG/5ML syrup Take 5 mLs by  mouth at bedtime as needed. 08/16/14   Enaya Howze P Tyera Hansley, DO     ROS: The patient denies fevers,  night sweats, unintentional weight loss, chest pain,  wheezing, dyspnea on exertion, nausea, vomiting, abdominal pain, dysuria, hematuria, melena, numbness, weakness, or tingling.   All other systems have been reviewed and were otherwise negative with the exception of those mentioned in the HPI and as above.    PHYSICAL EXAM: Filed Vitals:   08/16/14 1441  BP: 122/78  Pulse: 57  Temp: 97.8 F (36.6 C)  Resp: 16   Filed Vitals:   08/16/14 1441  Height: 5' (1.524 m)  Weight: 205 lb 12.8 oz (93.35 kg)   Body mass index is 40.19 kg/(m^2).  General: Alert, no acute distress HEENT:   Normocephalic, atraumatic, oropharynx patent. EOMI, PERRLA TM nl, erythematous throat, No exudates. + boggy nares, +/- sinus tenderness. Cardiovascular:  Skipped beat q 3-4 beaths, regular . no rubs murmurs or gallops.  No Carotid bruits, radial pulse intact. No pedal edema.  Respiratory: Clear to auscultation bilaterally.  No wheezes, rales, or rhonchi.  No cyanosis, no use of accessory musculature GI: No organomegaly, abdomen is soft and non-tender, positive bowel sounds.  No masses. Skin: No rashes. Neurologic: Facial musculature symmetric. Psychiatric: Patient is appropriate throughout our interaction. Lymphatic: No cervical lymphadenopathy Musculoskeletal: Gait intact.   LABS: Results for orders placed or performed in visit on 01/75/10  Basic metabolic panel  Result Value Ref Range   Sodium 139 135 - 145 mEq/L   Potassium 4.3 3.5 - 5.3 mEq/L   Chloride 101 96 - 112 mEq/L   CO2 25 19 - 32 mEq/L   Glucose, Bld 88 70 - 99 mg/dL   BUN 18 6 - 23 mg/dL   Creat 0.95 0.50 - 1.10 mg/dL   Calcium 9.6 8.4 - 10.5 mg/dL  TSH  Result Value Ref Range   TSH 4.070 0.350 - 4.500 uIU/mL  Magnesium  Result Value Ref Range   Magnesium 1.9 1.5 - 2.5 mg/dL  POCT CBC  Result Value Ref Range   WBC 11.5 (A) 4.6 - 10.2 K/uL   Lymph, poc 3.0 0.6 - 3.4   POC LYMPH PERCENT 26.5 10 - 50 %L   MID (cbc) 0.9 0 - 0.9   POC MID % 7.6 0 - 12 %M   POC Granulocyte 7.6 (A) 2 - 6.9   Granulocyte percent 65.9 37 - 80 %G   RBC 5.25 4.04 - 5.48 M/uL   Hemoglobin 15.1 12.2 - 16.2 g/dL   HCT, POC 46.5 37.7 - 47.9 %   MCV 88.6 80 - 97 fL   MCH, POC 28.8 27 - 31.2 pg   MCHC 32.5 31.8 - 35.4 g/dL   RDW, POC 16.1 %   Platelet Count, POC 240 142 - 424 K/uL   MPV 10.7 0 - 99.8 fL  POCT urinalysis dipstick  Result Value Ref Range   Color, UA yellow    Clarity, UA clear    Glucose, UA neg    Bilirubin, UA neg    Ketones, UA 40    Spec Grav, UA 1.015    Blood, UA neg    pH, UA 5.0    Protein, UA neg     Urobilinogen, UA 0.2    Nitrite, UA neg    Leukocytes, UA Negative      EKG/XRAY:   Primary read interpreted by Dr. Marin Comment at [facility].   ASSESSMENT/PLAN: Encounter Diagnoses  Name Primary?  Marland Kitchen  Acute upper respiratory infection Yes  . Lower respiratory infection   . Sinus congestion   . PVC's (premature ventricular contractions)    Will treat as if bacterial sinusitis since worsening sxs Rx z pack and also hycodan for cough , otc cough meds and nasacort Will try Z pack, has had that before, she does not have prolong QT based on prior ekg She was given amoxacillin before , advise that this may be viral since she does work in a daycare center She  Was given precutions, she still has the pause in heartbeat every 3-4 beats, she statethat she has no CP and does exertional activities withotu any problmes, she wants to sae her money to get insurance to see a cardiologist, she does not want another EKG just to confirm what I am actually hearing which is that the skipped beat is still present.   Gross sideeffects, risk and benefits, and alternatives of medications d/w patient. Patient is aware that all medications have potential sideeffects and we are unable to predict every sideeffect or drug-drug interaction that may occur.  Keatin Benham, [place], DO 08/18/2014 11:24 AM

## 2015-08-30 ENCOUNTER — Ambulatory Visit (INDEPENDENT_AMBULATORY_CARE_PROVIDER_SITE_OTHER): Payer: Self-pay | Admitting: Physician Assistant

## 2015-08-30 VITALS — BP 124/70 | HR 67 | Temp 97.7°F | Resp 18 | Ht 60.0 in | Wt 228.0 lb

## 2015-08-30 DIAGNOSIS — J0140 Acute pansinusitis, unspecified: Secondary | ICD-10-CM

## 2015-08-30 MED ORDER — IPRATROPIUM BROMIDE 0.03 % NA SOLN: 2.0000 | Freq: Two times a day (BID) | NASAL | Status: DC

## 2015-08-30 MED ORDER — DOXYCYCLINE HYCLATE 100 MG PO CAPS: 100.0000 mg | ORAL_CAPSULE | Freq: Two times a day (BID) | ORAL | Status: AC

## 2015-08-30 MED ORDER — GUAIFENESIN ER 1200 MG PO TB12: 1.0000 | ORAL_TABLET | Freq: Two times a day (BID) | ORAL | Status: DC | PRN

## 2015-08-30 NOTE — Progress Notes (Signed)
[facility] [address] [place] [address] [phone]  Date:  08/30/2015   Name:  Vickie Graham   DOB:  1976-06-21   MRN:  [id number]  PCP:  Vickie Graham    History of Present Illness:  Vickie Graham is a 39 y.o. female patient who presents to [facility] for chief complaint cough, nasal congestion, and sore throat.  Patient states that it began as cold-like symptoms 9 days ago.  But 4 days ago, she developed fever of 100.1.  Took dayquil which relieved the fever.  Next day she developed frontal and maxillary facial pain that lasted for 2 days.  She started to use the humidifer to offer relief, but then devlped nausea, vomiting, and diarrhea.  Cough is now productive.  Sh has a sore throat and a cough that worsens at night.  n/v/d fully resolved.  States her daughter had same symptoms the day before she had developed n/v/d.  Former smoker 9 years ago.  1pk day.  Patient Active Problem List   Diagnosis Date Noted  . Severe obesity (BMI >= 40) ([place]) 12/13/2013    Past Medical History  Diagnosis Date  . Allergy     History reviewed. No pertinent past surgical history.  Social History  Substance Use Topics  . Smoking status: Former Smoker    Quit date: 01/12/2010  . Smokeless tobacco: None  . Alcohol Use: No    History reviewed. No pertinent family history.  No Known Allergies  Medication list has been reviewed and updated.  Current Outpatient Prescriptions on File Prior to Visit  Medication Sig Dispense Refill  . HYDROcodone-homatropine (HYCODAN) 5-1.5 MG/5ML syrup Take 5 mLs by mouth at bedtime as needed. (Patient not taking: Reported on 08/30/2015) 120 mL 0   No current facility-administered medications on file prior to visit.    ROS ROS otherwise unremarkable unless listed above.  Physical Examination: BP 124/70 mmHg  Pulse 67  Temp(Src) 97.7 F (36.5 C) (Oral)  Resp 18  Ht 5' (1.524 m)  Wt 228 lb (103.42 kg)  BMI 44.53 kg/m2   SpO2 97%  LMP 08/15/2015 Ideal Body Weight: Weight in (lb) to have BMI = 25: 127.7  Physical Exam  Constitutional: She is oriented to person, place, and time. She appears well-developed and well-nourished. No distress.  HENT:  Head: Normocephalic and atraumatic.  Right Ear: Tympanic membrane, external ear and ear canal normal.  Left Ear: Tympanic membrane, external ear and ear canal normal.  Nose: Mucosal edema and rhinorrhea present. Right sinus exhibits maxillary sinus tenderness. Right sinus exhibits no frontal sinus tenderness. Left sinus exhibits maxillary sinus tenderness. Left sinus exhibits no frontal sinus tenderness.  Mouth/Throat: No uvula swelling. No oropharyngeal exudate, posterior oropharyngeal edema or posterior oropharyngeal erythema.  Eyes: EOM are normal. Pupils are equal, round, and reactive to light. Right conjunctiva is injected. Left conjunctiva is injected.  Lids mildly swollen  Cardiovascular: Normal rate and regular rhythm.  Exam reveals no gallop, no distant heart sounds and no friction rub.   No murmur heard. Pulmonary/Chest: Effort normal. No apnea. No respiratory distress. She has no decreased breath sounds. She has no wheezes. She has rhonchi.  Lymphadenopathy:       Head (right side): No submandibular, no tonsillar, no preauricular and no posterior auricular adenopathy present.       Head (left side): No submandibular, no tonsillar, no preauricular and no posterior auricular adenopathy present.  Neurological: She is alert and oriented  to person, place, and time.  Skin: She is not diaphoretic.  Psychiatric: She has a normal mood and affect. Her behavior is normal.      Assessment and Plan: Vickie Graham is a 39 y.o. female who is here today or nasal congestion, sinus, and cough.  Treating for sinus infection.  Possibility this could be viral given some of her symptoms, but treating after several days of symptoms.  -she does not like the response with  Augmentin and declines.  Will treat with doxy at this time.   -advised to start Claritin at home at this time. Subacute pansinusitis - Plan: Guaifenesin (MUCINEX MAXIMUM STRENGTH) 1200 MG TB12, ipratropium (ATROVENT) 0.03 % nasal spray, doxycycline (VIBRAMYCIN) 100 MG capsule     Vickie Drape, PA-C [facility] 08/30/2015 11:46 AM

## 2015-08-30 NOTE — Patient Instructions (Signed)

## 2015-08-31 NOTE — Addendum Note (Signed)
Addended by: Roselee Culver on: 08/31/2015 12:08 PM   Modules accepted: Miquel Dunn

## 2015-08-31 NOTE — Progress Notes (Signed)
  Medical screening examination/treatment/procedure(s) were performed by non-physician practitioner and as supervising physician I was immediately available for consultation/collaboration.     

## 2015-09-23 ENCOUNTER — Ambulatory Visit (INDEPENDENT_AMBULATORY_CARE_PROVIDER_SITE_OTHER): Payer: Self-pay | Admitting: Internal Medicine

## 2015-09-23 VITALS — BP 124/88 | HR 74 | Temp 97.6°F | Resp 18 | Ht 59.5 in | Wt 226.6 lb

## 2015-09-23 DIAGNOSIS — J0100 Acute maxillary sinusitis, unspecified: Secondary | ICD-10-CM

## 2015-09-23 MED ORDER — AMOXICILLIN 500 MG PO CAPS: 1000.0000 mg | ORAL_CAPSULE | Freq: Two times a day (BID) | ORAL | Status: AC

## 2015-09-23 NOTE — Progress Notes (Signed)
Subjective:  By signing my name below, I, Rawaa Al Rifaie, attest that this documentation has been prepared under the direction and in the presence of Tami Lin, MD.  Royann Shivers Rifaie, Medical Scribe. 09/23/2015.  11:43 AM.   Patient ID: Vickie Graham, female    DOB: Sep 01, 1976, 40 y.o.   MRN: [id number]  Chief Complaint  Patient presents with  . Headache    pressure behind eyes x this morning  . Cough    x 2-3 days    HPI HPI Comments: Vickie Graham is a 40 y.o. female who presents to [facility] complaining of possible sinusitis, gradual onset since last week.  She reports that her symptoms started with rhinorrhea, that then developed into mild cough about 3-4 days ago with associated congestion to the chest, in addition headache with pressure behind the eyes that started this morning. She reports that these symptoms are very similar to her previous history of sinusitis for which she was treated with doxa     Patient Active Problem List   Diagnosis Date Noted  . Severe obesity (BMI >= 40) ([place]) 12/13/2013   Past Medical History  Diagnosis Date  . Allergy    History reviewed. No pertinent past surgical history. No Known Allergies Prior to Admission medications   Medication Sig Start Date End Date Taking? Authorizing Provider  amoxicillin (AMOXIL) 500 MG capsule Take 2 capsules (1,000 mg total) by mouth 2 (two) times daily. 09/23/15 10/03/15  Leandrew Koyanagi, MD   Social History   Social History  . Marital Status: Legally Separated    Spouse Name: N/A  . Number of Children: N/A  . Years of Education: N/A   Occupational History  . Not on file.   Social History Main Topics  . Smoking status: Former Smoker    Quit date: 01/12/2010  . Smokeless tobacco: Not on file  . Alcohol Use: No  . Drug Use: No  . Sexual Activity: Not on file   Other Topics Concern  . Not on file   Social History Narrative    Review of Systems  HENT: Positive for  congestion, rhinorrhea and sinus pressure.   Respiratory: Positive for cough.   Neurological: Positive for headaches.      Objective:   Physical Exam  Constitutional: She is oriented to person, place, and time. She appears well-developed and well-nourished. No distress.  HENT:  Head: Normocephalic and atraumatic.  TMs are clear.  Purulent discharge on nares BL.   Eyes: EOM are normal. Pupils are equal, round, and reactive to light.  Neck: Neck supple.  Cardiovascular: Normal rate.   Pulmonary/Chest: Effort normal and breath sounds normal. No respiratory distress. She has no wheezes. She has no rales.  Lymphadenopathy:  No nodes.  Neurological: She is alert and oriented to person, place, and time. No cranial nerve deficit.  Skin: Skin is warm and dry.  Psychiatric: She has a normal mood and affect. Her behavior is normal.  Nursing note and vitals reviewed.   BP 124/88 mmHg  Pulse 74  Temp(Src) 97.6 F (36.4 C) (Oral)  Resp 18  Ht 4' 11.5" (1.511 m)  Wt 226 lb 9.6 oz (102.785 kg)  BMI 45.02 kg/m2  SpO2 96%  LMP 09/12/2015     Assessment & Plan:    I have completed the patient encounter in its entirety as documented by the scribe, with editing by me where necessary. Yaslene Lindamood P. Laney Pastor, M.D.  Acute maxillary  sinusitis, recurrence not specified   Meds ordered this encounter  Medications  . amoxicillin (AMOXIL) 500 MG capsule    Sig: Take 2 capsules (1,000 mg total) by mouth 2 (two) times daily.    Dispense:  40 capsule    Refill:  0   sudafed

## 2015-09-29 ENCOUNTER — Ambulatory Visit (INDEPENDENT_AMBULATORY_CARE_PROVIDER_SITE_OTHER): Payer: Self-pay | Admitting: Family Medicine

## 2015-09-29 VITALS — BP 116/70 | HR 60 | Temp 98.0°F | Resp 18 | Ht 61.0 in | Wt 228.0 lb

## 2015-09-29 DIAGNOSIS — Z111 Encounter for screening for respiratory tuberculosis: Secondary | ICD-10-CM

## 2015-09-29 DIAGNOSIS — Z021 Encounter for pre-employment examination: Secondary | ICD-10-CM

## 2015-09-29 NOTE — Progress Notes (Signed)
Subjective:    Patient ID: Vickie Graham, female    DOB: 04/15/1976, 40 y.o.   MRN: [id number]  09/29/2015  Immunizations and Employment Physical   HPI This 40 y.o. female presents for employment physical with Tb skin test.  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 Needs tb skin test.  No previous + Tb skin test.   Depression: s/p evaluation by [facility]; diagnosed with depression recurrent in 07/2014; not active at this time.  Went through depression post-partum. Also suffered with depression second episode unknown time.  Feels might have come out of another episode of depression.  Episodes of depression are years apart.  No postpartum depression with second childbirth.  Previous job was negatively impacting mood.  Also diagnosed with anxiety, social anxiety disorder, generalized anxiety disorder.  Also confirmed ADHD.  SI in high school only.   Daughter recently diagnosed with Aspergers, ADHD.  Bought this book to learn more about ADHD.  When started reading book, really sounded like pt.  With puberty or hormonal fluctuations, recognized characteristics.      Review of Systems  Constitutional: Negative for fever, chills, diaphoresis, activity change, appetite change, fatigue and unexpected weight change.  HENT: Negative for congestion, dental problem, drooling, ear discharge, ear pain, facial swelling, hearing loss, mouth sores, nosebleeds, postnasal drip, rhinorrhea, sinus pressure, sneezing, sore throat, tinnitus, trouble swallowing and voice change.   Eyes: Negative for photophobia, pain, discharge, redness, itching and visual disturbance.  Respiratory: Negative for apnea, cough, choking, chest tightness, shortness of breath, wheezing and stridor.   Cardiovascular: Negative for chest pain, palpitations and leg swelling.  Gastrointestinal: Negative for nausea, vomiting, abdominal pain, diarrhea, constipation, blood in stool, abdominal distention, anal bleeding and  rectal pain.  Endocrine: Negative for cold intolerance, heat intolerance, polydipsia, polyphagia and polyuria.  Genitourinary: Negative for dysuria, urgency, frequency, hematuria, flank pain, decreased urine volume, vaginal bleeding, vaginal discharge, enuresis, difficulty urinating, genital sores, vaginal pain, menstrual problem, pelvic pain and dyspareunia.  Musculoskeletal: Negative for myalgias, back pain, joint swelling, arthralgias, gait problem, neck pain and neck stiffness.  Skin: Negative for color change, pallor, rash and wound.  Allergic/Immunologic: Negative for environmental allergies, food allergies and immunocompromised state.  Neurological: Negative for dizziness, tremors, seizures, syncope, facial asymmetry, speech difficulty, weakness, light-headedness, numbness and headaches.  Hematological: Negative for adenopathy. Does not bruise/bleed easily.  Psychiatric/Behavioral: Negative for suicidal ideas, hallucinations, behavioral problems, confusion, sleep disturbance, self-injury, dysphoric mood, decreased concentration and agitation. The patient is not nervous/anxious and is not hyperactive.     Past Medical History  Diagnosis Date  . Allergy   . Depression   . ADHD (attention deficit hyperactivity disorder)     diagnosed at [facility] clinic.  Marland Kitchen Anxiety     Social anxiety, generalized anxiety disorder.   History reviewed. No pertinent past surgical history. No Known Allergies No current outpatient prescriptions on file.   No current facility-administered medications for this visit.   Social History   Social History  . Marital Status: Divorced    Spouse Name: N/A  . Number of Children: N/A  . Years of Education: N/A   Occupational History  . Not on file.   Social History Main Topics  . Smoking status: Former Smoker    Quit date: 01/12/2010  . Smokeless tobacco: Not on file  . Alcohol Use: No  . Drug Use: No  . Sexual Activity: Not on file   Other Topics Concern   .  Not on file   Social History Narrative   Marital status: divorced and engaged since 2011.     Children: 2 children      Lives with fiance and two children (63, 10)      Employment: childcare early development; 3s and 4s. Surveyor, [employer]      Tobacco: none      Alcohol: none      Drugs; None      Exercise: sporadic.           History reviewed. No pertinent family history.     Objective:    BP 116/70 mmHg  Pulse 60  Temp(Src) 98 F (36.7 C) (Oral)  Resp 18  Ht 5\' 1"  (1.549 m)  Wt 228 lb (103.42 kg)  BMI 43.10 kg/m2  SpO2 98%  LMP 09/12/2015 Physical Exam  Constitutional: She is oriented to person, place, and time. She appears well-developed and well-nourished. No distress.  HENT:  Head: Normocephalic and atraumatic.  Right Ear: External ear normal.  Left Ear: External ear normal.  Nose: Nose normal.  Mouth/Throat: Oropharynx is clear and moist.  Eyes: Conjunctivae and EOM are normal. Pupils are equal, round, and reactive to light.  Neck: Normal range of motion and full passive range of motion without pain. Neck supple. No JVD present. Carotid bruit is not present. No thyromegaly present.  Cardiovascular: Normal rate, regular rhythm and normal heart sounds.  Exam reveals no gallop and no friction rub.   No murmur heard. Pulmonary/Chest: Effort normal and breath sounds normal. She has no wheezes. She has no rales.  Abdominal: Soft. Bowel sounds are normal. She exhibits no distension and no mass. There is no tenderness. There is no rebound and no guarding.  Musculoskeletal:       Right shoulder: Normal.       Left shoulder: Normal.       Cervical back: Normal.  Lymphadenopathy:    She has no cervical adenopathy.  Neurological: She is alert and oriented to person, place, and time. She has normal reflexes. No cranial nerve deficit. She exhibits normal muscle tone. Coordination normal.  Skin: Skin is warm and dry. No rash noted. She is not diaphoretic. No erythema. No  pallor.  Psychiatric: She has a normal mood and affect. Her behavior is normal. Judgment and thought content normal.  Nursing note and vitals reviewed.  Results for orders placed or performed in visit on 09/29/15  TB Skin Test  Result Value Ref Range   TB Skin Test Negative    Induration 0 mm       Assessment & Plan:   1. Tuberculosis screening   2. Physical exam, pre-employment     Orders Placed This Encounter  Procedures  . TB Skin Test    Order Specific Question:  Has patient ever tested positive?    Answer:  No   No orders of the defined types were placed in this encounter.    No Follow-up on file.    Maryem Shuffler Elayne Guerin, M.D. [facility] [address] [place], [place]  [address] [phone] phone [phone] fax

## 2015-09-29 NOTE — Progress Notes (Signed)

## 2015-10-02 ENCOUNTER — Ambulatory Visit (INDEPENDENT_AMBULATORY_CARE_PROVIDER_SITE_OTHER): Payer: Self-pay | Admitting: *Deleted

## 2015-10-02 DIAGNOSIS — Z111 Encounter for screening for respiratory tuberculosis: Secondary | ICD-10-CM

## 2015-10-02 LAB — TB SKIN TEST
Induration: 0 mm
TB Skin Test: NEGATIVE

## 2015-11-24 ENCOUNTER — Ambulatory Visit (INDEPENDENT_AMBULATORY_CARE_PROVIDER_SITE_OTHER): Payer: Self-pay | Admitting: Urgent Care

## 2015-11-24 VITALS — BP 120/74 | HR 72 | Temp 97.8°F | Resp 18 | Wt 234.4 lb

## 2015-11-24 DIAGNOSIS — R5383 Other fatigue: Secondary | ICD-10-CM

## 2015-11-24 DIAGNOSIS — R42 Dizziness and giddiness: Secondary | ICD-10-CM

## 2015-11-24 LAB — COMPREHENSIVE METABOLIC PANEL
ALBUMIN: 4 g/dL (ref 3.6–5.1)
ALT: 26 U/L (ref 6–29)
AST: 21 U/L (ref 10–30)
Alkaline Phosphatase: 72 U/L (ref 33–115)
BUN: 19 mg/dL (ref 7–25)
CHLORIDE: 100 mmol/L (ref 98–110)
CO2: 27 mmol/L (ref 20–31)
CREATININE: 0.7 mg/dL (ref 0.50–1.10)
Calcium: 9.5 mg/dL (ref 8.6–10.2)
GLUCOSE: 85 mg/dL (ref 65–99)
Potassium: 4.8 mmol/L (ref 3.5–5.3)
SODIUM: 137 mmol/L (ref 135–146)
Total Bilirubin: 0.4 mg/dL (ref 0.2–1.2)
Total Protein: 6.6 g/dL (ref 6.1–8.1)

## 2015-11-24 LAB — POCT CBC
Granulocyte percent: 60 %G (ref 37–80)
HEMATOCRIT: 40.7 % (ref 37.7–47.9)
HEMOGLOBIN: 14.2 g/dL (ref 12.2–16.2)
Lymph, poc: 3.6 — AB (ref 0.6–3.4)
MCH: 28.5 pg (ref 27–31.2)
MCHC: 35 g/dL (ref 31.8–35.4)
MCV: 81.6 fL (ref 80–97)
MID (cbc): 0.8 (ref 0–0.9)
MPV: 10 fL (ref 0–99.8)
POC GRANULOCYTE: 6.5 (ref 2–6.9)
POC LYMPH PERCENT: 32.7 %L (ref 10–50)
POC MID %: 7.3 % (ref 0–12)
Platelet Count, POC: 191 10*3/uL (ref 142–424)
RBC: 4.98 M/uL (ref 4.04–5.48)
RDW, POC: 14.6 %
WBC: 10.9 10*3/uL — AB (ref 4.6–10.2)

## 2015-11-24 LAB — TSH: TSH: 2.96 mIU/L

## 2015-11-24 NOTE — Patient Instructions (Addendum)
Because you received labwork today, you will receive an invoice from [employer]. Please contact [facility] at [phone] with questions or concerns regarding your invoice. Our billing staff will not be able to assist you with those questions.  You will be contacted with the lab results as soon as they are available. The fastest way to get your results is to activate your My Chart account. Instructions are located on the last page of this paperwork. If you have not heard from [place] regarding the results in 2 weeks, please contact this office.    Dizziness Dizziness is a common problem. It is a feeling of unsteadiness or light-headedness. You may feel like you are about to faint. Dizziness can lead to injury if you stumble or fall. Anyone can become dizzy, but dizziness is more common in older adults. This condition can be caused by a number of things, including medicines, dehydration, or illness. HOME CARE INSTRUCTIONS Taking these steps may help with your condition: Eating and Drinking  Drink enough fluid to keep your urine clear or pale yellow. This helps to keep you from becoming dehydrated. Try to drink more clear fluids, such as water.  Do not drink alcohol.  Limit your caffeine intake if directed by your health care provider.  Limit your salt intake if directed by your health care provider. Activity  Avoid making quick movements.  Rise slowly from chairs and steady yourself until you feel okay.  In the morning, first sit up on the side of the bed. When you feel okay, stand slowly while you hold onto something until you know that your balance is fine.  Move your legs often if you need to stand in one place for a long time. Tighten and relax your muscles in your legs while you are standing.  Do not drive or operate heavy machinery if you feel dizzy.  Avoid bending down if you feel dizzy. Place items in your home so that they are easy for you to reach without  leaning over. Lifestyle  Do not use any tobacco products, including cigarettes, chewing tobacco, or electronic cigarettes. If you need help quitting, ask your health care provider.  Try to reduce your stress level, such as with yoga or meditation. Talk with your health care provider if you need help. General Instructions  Watch your dizziness for any changes.  Take medicines only as directed by your health care provider. Talk with your health care provider if you think that your dizziness is caused by a medicine that you are taking.  Tell a friend or a family member that you are feeling dizzy. If he or she notices any changes in your behavior, have this person call your health care provider.  Keep all follow-up visits as directed by your health care provider. This is important. SEEK MEDICAL CARE IF:  Your dizziness does not go away.  Your dizziness or light-headedness gets worse.  You feel nauseous.  You have reduced hearing.  You have new symptoms.  You are unsteady on your feet or you feel like the room is spinning. SEEK IMMEDIATE MEDICAL CARE IF:  You vomit or have diarrhea and are unable to eat or drink anything.  You have problems talking, walking, swallowing, or using your arms, hands, or legs.  You feel generally weak.  You are not thinking clearly or you have trouble forming sentences. It may take a friend or family member to notice this.  You have chest pain, abdominal pain, shortness of  breath, or sweating.  Your vision changes.  You notice any bleeding.  You have a headache.  You have neck pain or a stiff neck.  You have a fever.   This information is not intended to replace advice given to you by your health care provider. Make sure you discuss any questions you have with your health care provider.   Document Released: 02/27/2001 Document Revised: 01/18/2015 Document Reviewed: 08/30/2014 Elsevier Interactive Patient Education 2016 [employer].    Fatigue Fatigue is feeling tired all of the time, a lack of energy, or a lack of motivation. Occasional or mild fatigue is often a normal response to activity or life in general. However, long-lasting (chronic) or extreme fatigue may indicate an underlying medical condition. HOME CARE INSTRUCTIONS  Watch your fatigue for any changes. The following actions may help to lessen any discomfort you are feeling:  Talk to your health care provider about how much sleep you need each night. Try to get the required amount every night.  Take medicines only as directed by your health care provider.  Eat a healthy and nutritious diet. Ask your health care provider if you need help changing your diet.  Drink enough fluid to keep your urine clear or pale yellow.  Practice ways of relaxing, such as yoga, meditation, massage therapy, or acupuncture.  Exercise regularly.   Change situations that cause you stress. Try to keep your work and personal routine reasonable.  Do not abuse illegal drugs.  Limit alcohol intake to no more than 1 drink per day for nonpregnant women and 2 drinks per day for men. One drink equals 12 ounces of beer, 5 ounces of wine, or 1 ounces of hard liquor.  Take a multivitamin, if directed by your health care provider. SEEK MEDICAL CARE IF:   Your fatigue does not get better.  You have a fever.   You have unintentional weight loss or gain.  You have headaches.   You have difficulty:   Falling asleep.  Sleeping throughout the night.  You feel angry, guilty, anxious, or sad.   You are unable to have a bowel movement (constipation).   You skin is dry.   Your legs or another part of your body is swollen.  SEEK IMMEDIATE MEDICAL CARE IF:   You feel confused.   Your vision is blurry.  You feel faint or pass out.   You have a severe headache.   You have severe abdominal, pelvic, or back pain.   You have chest pain, shortness of  breath, or an irregular or fast heartbeat.   You are unable to urinate or you urinate less than normal.   You develop abnormal bleeding, such as bleeding from the rectum, vagina, nose, lungs, or nipples.  You vomit blood.   You have thoughts about harming yourself or committing suicide.   You are worried that you might harm someone else.    This information is not intended to replace advice given to you by your health care provider. Make sure you discuss any questions you have with your health care provider.   Document Released: 07/01/2007 Document Revised: 09/24/2014 Document Reviewed: 01/05/2014 Elsevier Interactive Patient Education [employer].

## 2015-11-24 NOTE — Progress Notes (Signed)
    MRN: [id number] DOB: June 26, 1976  Subjective:   Vickie Graham is a 40 y.o. female presenting for chief complaint of Dizziness and Fatigue  Reports 2 day history of dizziness and fatigue (the past 2 weeks). 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 Reports a history of depression in high school and post-partum in 1997. Denies headache, double vision, chest pain, shob, heart racing, palpitations, n/v, abdominal pain. She admits that she was having a difficult time with her mood in January prior to starting her job. However, since then this has drastically improved. She also admits "normal people" diet. She does not exercise. Denies smoking cigarettes, alcohol use. Denies history of thyroid disease, arrhythmias, anemia.  Vickie Graham currently has no medications in their medication list. Also has No Known Allergies.  Vickie Graham  has a past medical history of Allergy; Depression; ADHD (attention deficit hyperactivity disorder); and Anxiety. Also  has no past surgical history on file.  Objective:   Vitals: BP 120/74 mmHg  Pulse 72  Temp(Src) 97.8 F (36.6 C) (Oral)  Resp 18  Wt 234 lb 6.4 oz (106.323 kg)  SpO2 96%  LMP 11/11/2015  Physical Exam  Constitutional: She is oriented to person, place, and time. She appears well-developed and well-nourished.  HENT:  Mouth/Throat: Oropharynx is clear and moist.  Eyes: Pupils are equal, round, and reactive to light. No scleral icterus.  Neck: Normal range of motion. Neck supple. No thyromegaly present.  Cardiovascular: Normal rate and intact distal pulses.  Exam reveals no gallop and no friction rub.   No murmur heard. Regularly irregular heart rhythm.  Pulmonary/Chest: No respiratory distress. She has no wheezes. She has no rales.  Abdominal: Soft. Bowel sounds are normal. She exhibits no distension and no mass. There is no tenderness.  Neurological: She is alert and oriented to person, place, and time.  Skin: Skin is warm and dry.    Psychiatric: She has a normal mood and affect.   Results for orders placed or performed in visit on 11/24/15 (from the past 24 hour(s))  POCT CBC     Status: Abnormal   Collection Time: 11/24/15 12:59 PM  Result Value Ref Range   WBC 10.9 (A) 4.6 - 10.2 K/uL   Lymph, poc 3.6 (A) 0.6 - 3.4   POC LYMPH PERCENT 32.7 10 - 50 %L   MID (cbc) 0.8 0 - 0.9   POC MID % 7.3 0 - 12 %M   POC Granulocyte 6.5 2 - 6.9   Granulocyte percent 60.0 37 - 80 %G   RBC 4.98 4.04 - 5.48 M/uL   Hemoglobin 14.2 12.2 - 16.2 g/dL   HCT, POC 40.7 37.7 - 47.9 %   MCV 81.6 80 - 97 fL   MCH, POC 28.5 27 - 31.2 pg   MCHC 35.0 31.8 - 35.4 g/dL   RDW, POC 14.6 %   Platelet Count, POC 191 142 - 424 K/uL   MPV 10.0 0 - 99.8 fL   ECG interpretation - Poor R-wave progression, possible Q-wave in Lead III, otherwise in sinus rhythm.  Assessment and Plan :   1. Other fatigue 2. Dizziness - Unclear etiology, labs pending. Will follow up thereafter.  Jaynee Eagles, PA-C [facility] [phone] 11/24/2015 12:02 PM

## 2018-10-16 ENCOUNTER — Emergency Department (HOSPITAL_COMMUNITY)
Admission: EM | Admit: 2018-10-16 | Discharge: 2018-10-16 | Disposition: A | Discharge status: Home / Self Care | Payer: Medicaid Other | Attending: Emergency Medicine | Admitting: Emergency Medicine

## 2018-10-16 ENCOUNTER — Other Ambulatory Visit: Payer: Self-pay

## 2018-10-16 ENCOUNTER — Emergency Department (HOSPITAL_COMMUNITY): Payer: Medicaid Other

## 2018-10-16 ENCOUNTER — Encounter (HOSPITAL_COMMUNITY): Payer: Self-pay | Admitting: Emergency Medicine

## 2018-10-16 DIAGNOSIS — R079 Chest pain, unspecified: Secondary | ICD-10-CM | POA: Diagnosis present

## 2018-10-16 DIAGNOSIS — R5383 Other fatigue: Secondary | ICD-10-CM | POA: Diagnosis not present

## 2018-10-16 DIAGNOSIS — F909 Attention-deficit hyperactivity disorder, unspecified type: Secondary | ICD-10-CM | POA: Diagnosis not present

## 2018-10-16 DIAGNOSIS — I493 Ventricular premature depolarization: Secondary | ICD-10-CM

## 2018-10-16 DIAGNOSIS — R05 Cough: Secondary | ICD-10-CM | POA: Diagnosis not present

## 2018-10-16 DIAGNOSIS — I491 Atrial premature depolarization: Secondary | ICD-10-CM

## 2018-10-16 DIAGNOSIS — Z87891 Personal history of nicotine dependence: Secondary | ICD-10-CM | POA: Insufficient documentation

## 2018-10-16 DIAGNOSIS — R059 Cough, unspecified: Secondary | ICD-10-CM

## 2018-10-16 LAB — CBC WITH DIFFERENTIAL/PLATELET
Abs Immature Granulocytes: 0.06 10*3/uL (ref 0.00–0.07)
BASOS PCT: 1 %
Basophils Absolute: 0.1 10*3/uL (ref 0.0–0.1)
Eosinophils Absolute: 0.2 10*3/uL (ref 0.0–0.5)
Eosinophils Relative: 2 %
HCT: 48.2 % — ABNORMAL HIGH (ref 36.0–46.0)
Hemoglobin: 14.2 g/dL (ref 12.0–15.0)
IMMATURE GRANULOCYTES: 1 %
Lymphocytes Relative: 23 %
Lymphs Abs: 2.8 10*3/uL (ref 0.7–4.0)
MCH: 24.1 pg — ABNORMAL LOW (ref 26.0–34.0)
MCHC: 29.5 g/dL — ABNORMAL LOW (ref 30.0–36.0)
MCV: 82 fL (ref 80.0–100.0)
Monocytes Absolute: 1.1 10*3/uL — ABNORMAL HIGH (ref 0.1–1.0)
Monocytes Relative: 9 %
NEUTROS PCT: 64 %
NRBC: 0 % (ref 0.0–0.2)
Neutro Abs: 8.3 10*3/uL — ABNORMAL HIGH (ref 1.7–7.7)
PLATELETS: 306 10*3/uL (ref 150–400)
RBC: 5.88 MIL/uL — ABNORMAL HIGH (ref 3.87–5.11)
RDW: 17.2 % — ABNORMAL HIGH (ref 11.5–15.5)
WBC: 12.6 10*3/uL — ABNORMAL HIGH (ref 4.0–10.5)

## 2018-10-16 LAB — COMPREHENSIVE METABOLIC PANEL
ALT: 20 U/L (ref 0–44)
ANION GAP: 10 (ref 5–15)
AST: 21 U/L (ref 15–41)
Albumin: 3.5 g/dL (ref 3.5–5.0)
Alkaline Phosphatase: 78 U/L (ref 38–126)
BUN: 10 mg/dL (ref 6–20)
CO2: 23 mmol/L (ref 22–32)
Calcium: 9.4 mg/dL (ref 8.9–10.3)
Chloride: 102 mmol/L (ref 98–111)
Creatinine, Ser: 0.88 mg/dL (ref 0.44–1.00)
Glucose, Bld: 100 mg/dL — ABNORMAL HIGH (ref 70–99)
Potassium: 4.4 mmol/L (ref 3.5–5.1)
Sodium: 135 mmol/L (ref 135–145)
Total Bilirubin: 0.8 mg/dL (ref 0.3–1.2)
Total Protein: 7 g/dL (ref 6.5–8.1)

## 2018-10-16 LAB — I-STAT TROPONIN, ED
TROPONIN I, POC: 0 ng/mL (ref 0.00–0.08)
Troponin i, poc: 0.02 ng/mL (ref 0.00–0.08)

## 2018-10-16 LAB — D-DIMER, QUANTITATIVE: D-Dimer, Quant: 0.39 ug/mL-FEU (ref 0.00–0.50)

## 2018-10-16 LAB — POC URINE PREG, ED: PREG TEST UR: NEGATIVE

## 2018-10-16 NOTE — ED Notes (Signed)
Pt given discharge paperwork, has more questions about care for EDP. Pt moved to progressive bed, EDP aware.

## 2018-10-16 NOTE — ED Provider Notes (Signed)
[place] EMERGENCY DEPARTMENT Provider Note   CSN: [id number] Arrival date & time: 10/16/18  1049     History   Chief Complaint Chief Complaint  Patient presents with  . Chest Pain    HPI KARLA PAVONE is a 43 y.o. female.  HPI   In and out of urgent care, had URI symptoms, fever, cough, throat, productive cough, severe, 3-4 weeks Today went back to urgent care and was sent here Chest pain occurred left lower side, started beginning of last week, came out of nowhere, was sharp, had it for one week then at night would lay on right because was worse laying on left.   When laying on left side was worse then went back to urgent care on Sunday, diagnosed with pleurisy, prescribed antiinflammatory, took it a few times and then last night cough started coming back, not as bad as it was before. Stopped taking the antiinflammatory because thought it made her sleepy.  For the last 2 days has been feeling very fatigued, unmotivated.  Saw sinus arrhythmia for EKG and sent here.  Today here with worsening cough and fatigue.  The pain is there with picking things up, like their dog. Came back. Not worse with walking.  Every now and then is faint soreness.  Sharp, worse with movements, coughing.  Not there when still.   Maybe slight shortness of breath.  No fever  No hx of dvt, pe, long trips, immobilization No fam hx of early CAD, no cigarettes   Past Medical History:  Diagnosis Date  . ADHD (attention deficit hyperactivity disorder)    diagnosed at [facility].  Marland Kitchen Allergy   . Anxiety    Social anxiety, generalized anxiety disorder.  . Depression     Patient Active Problem List   Diagnosis Date Noted  . Severe obesity (BMI >= 40) ([place]) 12/13/2013    History reviewed. No pertinent surgical history.   OB History   No obstetric history on file.      Home Medications    Prior to Admission medications   Medication Sig Start Date End Date Taking?  Authorizing Provider  diclofenac (VOLTAREN) 50 MG EC tablet Take 1 tablet by mouth every 8 (eight) hours as needed for pain. 10/12/18  Yes [provider]    Family History History reviewed. No pertinent family history.  Social History Social History   Tobacco Use  . Smoking status: Former Smoker    Last attempt to quit: 01/12/2010    Years since quitting: 8.7  Substance Use Topics  . Alcohol use: No  . Drug use: No     Allergies   Patient has no known allergies.   Review of Systems Review of Systems  Constitutional: Positive for fever.  Respiratory: Positive for cough.   Cardiovascular: Positive for chest pain. Negative for leg swelling.  Gastrointestinal: Negative for abdominal pain, nausea and vomiting.  Neurological: Positive for light-headedness (2 weeks ago wednesday).     Physical Exam Updated Vital Signs BP (!) 107/41   Pulse (!) 30   Temp 97.6 F (36.4 C) (Oral)   Resp (!) 21   Ht 5' (1.524 m)   SpO2 93%   BMI 45.78 kg/m   Physical Exam Vitals signs and nursing note reviewed.  Constitutional:      General: She is not in acute distress.    Appearance: She is well-developed. She is not diaphoretic.  HENT:     Head: Normocephalic and atraumatic.  Eyes:     Conjunctiva/sclera: Conjunctivae normal.  Neck:     Musculoskeletal: Normal range of motion.  Cardiovascular:     Rate and Rhythm: Normal rate and regular rhythm.     Heart sounds: Normal heart sounds. No murmur. No friction rub. No gallop.   Pulmonary:     Effort: Pulmonary effort is normal. No respiratory distress.     Breath sounds: Normal breath sounds. No wheezing or rales.  Abdominal:     General: There is no distension.     Palpations: Abdomen is soft.     Tenderness: There is no abdominal tenderness. There is no guarding.  Musculoskeletal:        General: No tenderness.  Skin:    General: Skin is warm and dry.     Findings: No erythema or rash.  Neurological:     Mental  Status: She is alert and oriented to person, place, and time.      ED Treatments / Results  Labs (all labs ordered are listed, but only abnormal results are displayed) Labs Reviewed  CBC WITH DIFFERENTIAL/PLATELET - Abnormal; Notable for the following components:      Result Value   WBC 12.6 (*)    RBC 5.88 (*)    HCT 48.2 (*)    MCH 24.1 (*)    MCHC 29.5 (*)    RDW 17.2 (*)    Neutro Abs 8.3 (*)    Monocytes Absolute 1.1 (*)    All other components within normal limits  COMPREHENSIVE METABOLIC PANEL - Abnormal; Notable for the following components:   Glucose, Bld 100 (*)    All other components within normal limits  D-DIMER, QUANTITATIVE (NOT AT [facility])  POC URINE PREG, ED  I-STAT TROPONIN, ED  I-STAT TROPONIN, ED    EKG EKG Interpretation  Date/Time:  Thursday October 16 2018 11:17:47 EST Ventricular Rate:  87 PR Interval:    QRS Duration: 95 QT Interval:  345 QTC Calculation: 415 R Axis:   -3 Text Interpretation:  Sinus tachycardia Atrial premature complexes Anterior infarct, old No previous ECGs available Confirmed by Gareth Morgan [phone]) on 10/16/2018 12:01:08 PM   Radiology Dg Chest 2 View  Result Date: 10/16/2018 CLINICAL DATA:  Chest pain, cough. EXAM: CHEST - 2 VIEW COMPARISON:  None. FINDINGS: The heart size and mediastinal contours are within normal limits. Both lungs are clear. No pneumothorax or pleural effusion is noted. The visualized skeletal structures are unremarkable. IMPRESSION: No active cardiopulmonary disease. Electronically Signed   By: Marijo Conception, M.D.   On: 10/16/2018 13:16    Procedures Procedures (including critical care time)  Medications Ordered in ED Medications - No data to display   Initial Impression / Assessment and Plan / ED Course  I have reviewed the triage vital signs and the nursing notes.  Pertinent labs & imaging results that were available during my care of the patient were reviewed by me and considered in my  medical decision making (see chart for details).     43yo female presents with concern for cough, fatigue, sent by urgent care for concern for arrhythmia.  EKGs from urgent care reviewed and consistent with sinus arrhythmia. ECG in ED consistent with sinus arrhythmia with PACs, and telemetry monitoring shows same with PVCs as well.    Low ristk wells with negative ddimer.  Troponin negative x2, doubt ACS. Low risk HEAR score and CP sounds more msk by history given worse laying on that side, and pt without  CP today.  No signs of pneumonia on CXR.  Cough possible bronchitis/viral etiology. No sign of anemia or electrolyte abnormalities to explain fatigue. Recommend PCP follow up and recommend follow up with Cardiology for PVCs/PACs.  Patient discharged in stable condition with understanding of reasons to return.     Final Clinical Impressions(s) / ED Diagnoses   Final diagnoses:  Other fatigue  Cough  PVC (premature ventricular contraction)  Premature atrial complex    ED Discharge Orders    None       Gareth Morgan, MD 10/16/18 2108

## 2018-10-16 NOTE — ED Notes (Signed)
Patient spoken with EDP and verbalized understanding. Patient left ambulatory and in NAD.

## 2018-10-16 NOTE — ED Triage Notes (Signed)
Pt arrives to ED from the urgent care with complaints of chest pain and cough for the past three weeks. Pt reports she had an abnormal EKG and was sent here. Pt placed in position of comfort with bed locked and lowered, call bell in reach.

## 2018-10-19 ENCOUNTER — Encounter (HOSPITAL_COMMUNITY): Payer: Self-pay | Admitting: Emergency Medicine

## 2018-10-19 ENCOUNTER — Emergency Department (HOSPITAL_COMMUNITY): Payer: Medicaid Other

## 2018-10-19 ENCOUNTER — Emergency Department (HOSPITAL_COMMUNITY)
Admission: EM | Admit: 2018-10-19 | Discharge: 2018-10-19 | Disposition: A | Discharge status: Home / Self Care | Payer: Medicaid Other | Attending: Emergency Medicine | Admitting: Emergency Medicine

## 2018-10-19 ENCOUNTER — Other Ambulatory Visit: Payer: Self-pay

## 2018-10-19 DIAGNOSIS — J181 Lobar pneumonia, unspecified organism: Secondary | ICD-10-CM | POA: Insufficient documentation

## 2018-10-19 DIAGNOSIS — K625 Hemorrhage of anus and rectum: Secondary | ICD-10-CM | POA: Diagnosis present

## 2018-10-19 DIAGNOSIS — D259 Leiomyoma of uterus, unspecified: Secondary | ICD-10-CM | POA: Diagnosis not present

## 2018-10-19 DIAGNOSIS — K649 Unspecified hemorrhoids: Secondary | ICD-10-CM | POA: Insufficient documentation

## 2018-10-19 DIAGNOSIS — K8020 Calculus of gallbladder without cholecystitis without obstruction: Secondary | ICD-10-CM | POA: Diagnosis not present

## 2018-10-19 DIAGNOSIS — J189 Pneumonia, unspecified organism: Secondary | ICD-10-CM

## 2018-10-19 DIAGNOSIS — Z87891 Personal history of nicotine dependence: Secondary | ICD-10-CM | POA: Insufficient documentation

## 2018-10-19 DIAGNOSIS — K921 Melena: Secondary | ICD-10-CM

## 2018-10-19 LAB — COMPREHENSIVE METABOLIC PANEL
ALT: 21 U/L (ref 0–44)
AST: 28 U/L (ref 15–41)
Albumin: 3.5 g/dL (ref 3.5–5.0)
Alkaline Phosphatase: 74 U/L (ref 38–126)
Anion gap: 10 (ref 5–15)
BUN: 14 mg/dL (ref 6–20)
CO2: 22 mmol/L (ref 22–32)
Calcium: 9.4 mg/dL (ref 8.9–10.3)
Chloride: 105 mmol/L (ref 98–111)
Creatinine, Ser: 1 mg/dL (ref 0.44–1.00)
GFR calc Af Amer: >60 mL/min (ref >60)
GFR calc non Af Amer: >60 mL/min (ref >60)
Glucose, Bld: 130 mg/dL — ABNORMAL HIGH (ref 70–99)
Potassium: 4.4 mmol/L (ref 3.5–5.1)
Sodium: 137 mmol/L (ref 135–145)
Total Bilirubin: 0.1 mg/dL — ABNORMAL LOW (ref 0.3–1.2)
Total Protein: 7.1 g/dL (ref 6.5–8.1)

## 2018-10-19 LAB — CBC WITH DIFFERENTIAL/PLATELET
Abs Immature Granulocytes: 0.02 10*3/uL (ref 0.00–0.07)
Basophils Absolute: 0.1 10*3/uL (ref 0.0–0.1)
Basophils Relative: 1 %
Eosinophils Absolute: 0.1 10*3/uL (ref 0.0–0.5)
Eosinophils Relative: 1 %
HCT: 47.1 % — ABNORMAL HIGH (ref 36.0–46.0)
Hemoglobin: 14.1 g/dL (ref 12.0–15.0)
Immature Granulocytes: 0 %
Lymphocytes Relative: 30 %
Lymphs Abs: 2.4 10*3/uL (ref 0.7–4.0)
MCH: 24.4 pg — ABNORMAL LOW (ref 26.0–34.0)
MCHC: 29.9 g/dL — ABNORMAL LOW (ref 30.0–36.0)
MCV: 81.3 fL (ref 80.0–100.0)
Monocytes Absolute: 1.3 10*3/uL — ABNORMAL HIGH (ref 0.1–1.0)
Monocytes Relative: 17 %
Neutro Abs: 4.1 10*3/uL (ref 1.7–7.7)
Neutrophils Relative %: 51 %
Platelets: 249 10*3/uL (ref 150–400)
RBC: 5.79 MIL/uL — ABNORMAL HIGH (ref 3.87–5.11)
RDW: 16.9 % — ABNORMAL HIGH (ref 11.5–15.5)
WBC: 8.1 10*3/uL (ref 4.0–10.5)
nRBC: 0 % (ref 0.0–0.2)

## 2018-10-19 LAB — URINALYSIS, ROUTINE W REFLEX MICROSCOPIC
Bilirubin Urine: NEGATIVE
Glucose, UA: NEGATIVE mg/dL
Hgb urine dipstick: NEGATIVE
Ketones, ur: NEGATIVE mg/dL
Leukocytes, UA: NEGATIVE
Nitrite: NEGATIVE
Protein, ur: NEGATIVE mg/dL
Specific Gravity, Urine: >1.046 — ABNORMAL HIGH (ref 1.005–1.030)
pH: 5 (ref 5.0–8.0)

## 2018-10-19 LAB — POC OCCULT BLOOD, ED: Fecal Occult Bld: POSITIVE — AB

## 2018-10-19 LAB — I-STAT BETA HCG BLOOD, ED (MC, WL, AP ONLY): I-stat hCG, quantitative: <5 m[IU]/mL (ref <5)

## 2018-10-19 LAB — LIPASE, BLOOD: Lipase: 29 U/L (ref 11–51)

## 2018-10-19 MED ORDER — AZITHROMYCIN 250 MG PO TABS
500.0000 mg | ORAL_TABLET | Freq: Once | ORAL | Status: AC
  Administered 2018-10-19: 500 mg via ORAL
  Filled 2018-10-19: qty 2

## 2018-10-19 MED ORDER — MORPHINE SULFATE (PF) 4 MG/ML IV SOLN: 4.0000 mg | Freq: Once | INTRAVENOUS | Status: DC

## 2018-10-19 MED ORDER — IOHEXOL 300 MG/ML  SOLN
100.0000 mL | Freq: Once | INTRAMUSCULAR | Status: AC | PRN
  Administered 2018-10-19: 100 mL via INTRAVENOUS

## 2018-10-19 MED ORDER — AZITHROMYCIN 250 MG PO TABS: 250.0000 mg | ORAL_TABLET | Freq: Every day | ORAL | 0 refills | Status: AC

## 2018-10-19 MED ORDER — ACETAMINOPHEN 325 MG PO TABS
650.0000 mg | ORAL_TABLET | Freq: Once | ORAL | Status: AC
  Administered 2018-10-19: 650 mg via ORAL
  Filled 2018-10-19: qty 2

## 2018-10-19 MED ORDER — SODIUM CHLORIDE 0.9 % IV BOLUS
1000.0000 mL | Freq: Once | INTRAVENOUS | Status: AC
  Administered 2018-10-19: 1000 mL via INTRAVENOUS

## 2018-10-19 MED ORDER — HYDROCORTISONE 2.5 % RE CREA: TOPICAL_CREAM | RECTAL | 0 refills | Status: DC

## 2018-10-19 MED ORDER — ONDANSETRON HCL 4 MG/2ML IJ SOLN: 4.0000 mg | Freq: Once | INTRAMUSCULAR | Status: DC

## 2018-10-19 NOTE — ED Notes (Signed)
Patient transported to CT 

## 2018-10-19 NOTE — Discharge Instructions (Addendum)

## 2018-10-19 NOTE — ED Notes (Signed)
Patient returned from CT

## 2018-10-19 NOTE — ED Notes (Signed)
ED Provider at bedside. 

## 2018-10-19 NOTE — ED Triage Notes (Signed)
States has blood in stool, was on an NSAID-- started at 1330 today- diarrhea since yesterday -- bright red stool today-- treated for pleurisy last Sunday, --

## 2018-10-19 NOTE — ED Provider Notes (Signed)
[facility] EMERGENCY DEPARTMENT Provider Note   CSN: [id number] Arrival date & time: 10/19/18  1418     History   Chief Complaint Chief Complaint  Patient presents with  . Blood In Stools    HPI Vickie Graham is a 43 y.o. female with history of ADHD, anxiety, depression, obesity presenting for evaluation of acute onset bright red blood per rectum beginning today.  She was seen and evaluated in the ED 3 days ago for evaluation of pleuritic chest pain with a reassuring work-up and discharged home.  She reports that since then she has been feeling fatigued which has been ongoing throughout the duration of her symptoms.  She notes no improvement in her left-sided pleuritic chest pains and also notes that her pain seems to extend into her left upper quadrant epigastric region of the abdomen now.  Pain worsens after bending.  She does note some minimal shortness of breath but denies any significant dyspnea.  No fevers or chills.  She notes cough productive of yellow sputum.  What brought her into the emergency department today is noticing bright red blood in the commode after having a loose bowel movement earlier today.  She reports that she has been having some diarrhea over the past few days, 3-4 episodes of nonbloody watery stools yesterday.  Today she noted "enough blood that I thought I was on my period", but she denies any vaginal bleeding.  She noted bright red blood upon wiping as well.  Denies dark tarry stools or hematochezia.  Denies any urinary symptoms, nausea, or vomiting.  States that she has not had any anti-inflammatories in the past few days.  The history is provided by the patient.    Past Medical History:  Diagnosis Date  . ADHD (attention deficit hyperactivity disorder)    diagnosed at [facility] clinic.  Marland Kitchen Allergy   . Anxiety    Social anxiety, generalized anxiety disorder.  . Depression     Patient Active Problem List   Diagnosis Date Noted  . Severe  obesity (BMI >= 40) ([place]) 12/13/2013    History reviewed. No pertinent surgical history.   OB History   No obstetric history on file.      Home Medications    Prior to Admission medications   Medication Sig Start Date End Date Taking? Authorizing Provider  azithromycin (ZITHROMAX) 250 MG tablet Take 1 tablet (250 mg total) by mouth daily for 4 days. 10/19/18 10/23/18  Lorin Glass, PA-C  hydrocortisone (ANUSOL-HC) 2.5 % rectal cream Apply rectally 2 times daily 10/19/18   Lorin Glass, PA-C    Family History No family history on file.  Social History Social History   Tobacco Use  . Smoking status: Former Smoker    Last attempt to quit: 01/12/2010    Years since quitting: 8.7  . Smokeless tobacco: Never Used  Substance Use Topics  . Alcohol use: No  . Drug use: No     Allergies   Patient has no known allergies.   Review of Systems Review of Systems  Constitutional: Negative for chills and fever.  Respiratory: Positive for cough.   Cardiovascular: Positive for chest pain.  Gastrointestinal: Positive for abdominal pain, blood in stool and diarrhea. Negative for nausea and vomiting.  Genitourinary: Negative for dysuria, hematuria, urgency and vaginal discharge.  All other systems reviewed and are negative.    Physical Exam Updated Vital Signs BP 122/64 (BP Location: Right Arm)   Pulse 78  Temp 97.7 F (36.5 C) (Oral)   Resp 18   Ht 5' (1.524 m)   Wt 106 kg   LMP 09/21/2018   SpO2 97%   BMI 45.64 kg/m   Physical Exam Vitals signs and nursing note reviewed.  Constitutional:      General: She is not in acute distress.    Appearance: Normal appearance. She is well-developed. She is obese.  HENT:     Head: Normocephalic and atraumatic.  Eyes:     General:        Right eye: No discharge.        Left eye: No discharge.     Conjunctiva/sclera: Conjunctivae normal.  Neck:     Vascular: No JVD.     Trachea: No tracheal deviation.    Cardiovascular:     Rate and Rhythm: Tachycardia present.     Pulses: Normal pulses.     Heart sounds: Normal heart sounds.  Pulmonary:     Effort: Pulmonary effort is normal.     Breath sounds: Normal breath sounds.  Chest:     Chest wall: Tenderness present.    Abdominal:     General: There is no distension.     Tenderness: There is abdominal tenderness in the epigastric area, left upper quadrant and left lower quadrant. There is no right CVA tenderness, left CVA tenderness, guarding or rebound. Negative signs include Murphy's sign and Rovsing's sign.  Genitourinary:    Comments: Examination performed in the presence of a chaperone.  Patient has 2 small external hemorrhoids, 1 of which appears mildly irritated but not actively bleeding or thrombosed. There is bright red blood on DRE, scant stool in rectal vault Skin:    General: Skin is warm and dry.     Findings: No erythema.  Neurological:     Mental Status: She is alert.  Psychiatric:        Behavior: Behavior normal.      ED Treatments / Results  Labs (all labs ordered are listed, but only abnormal results are displayed) Labs Reviewed  CBC WITH DIFFERENTIAL/PLATELET - Abnormal; Notable for the following components:      Result Value   RBC 5.79 (*)    HCT 47.1 (*)    MCH 24.4 (*)    MCHC 29.9 (*)    RDW 16.9 (*)    Monocytes Absolute 1.3 (*)    All other components within normal limits  COMPREHENSIVE METABOLIC PANEL - Abnormal; Notable for the following components:   Glucose, Bld 130 (*)    Total Bilirubin 0.1 (*)    All other components within normal limits  URINALYSIS, ROUTINE W REFLEX MICROSCOPIC - Abnormal; Notable for the following components:   Specific Gravity, Urine >1.046 (*)    All other components within normal limits  POC OCCULT BLOOD, ED - Abnormal; Notable for the following components:   Fecal Occult Bld POSITIVE (*)    All other components within normal limits  LIPASE, BLOOD  I-STAT BETA HCG  BLOOD, ED ([facility], [facility], [facility] ONLY)    EKG EKG Interpretation  Date/Time:  Sunday October 19 2018 14:31:26 EST Ventricular Rate:  121 PR Interval:    QRS Duration: 85 QT Interval:  301 QTC Calculation: 415 R Axis:   85 Text Interpretation:  Sinus tachycardia Atrial premature complexes Anterior infarct, old Borderline repolarization abnormality Baseline wander in lead(s) V3 V6 When compared to prior, fater rate.  t wave inversion now in lead 3.  No STEMI Confirmed by Tegeler,  Gerald Stabs [phone]) on 10/19/2018 3:26:42 PM   Radiology Ct Abdomen Pelvis W Contrast  Result Date: 10/19/2018 CLINICAL DATA:  Abdominal pain. EXAM: CT ABDOMEN AND PELVIS WITH CONTRAST TECHNIQUE: Multidetector CT imaging of the abdomen and pelvis was performed using the standard protocol following bolus administration of intravenous contrast. CONTRAST:  169mL OMNIPAQUE IOHEXOL 300 MG/ML  SOLN COMPARISON:  None. FINDINGS: Lower chest: Patchy airspace opacities identified posterior right upper lobe (2/4). Hepatobiliary: No suspicious focal abnormality within the liver parenchyma. Noncalcified stones are suspected in the gallbladder. No intrahepatic or extrahepatic biliary dilation. Pancreas: No focal mass lesion. No dilatation of the main duct. No intraparenchymal cyst. No peripancreatic edema. Spleen: No splenomegaly. No focal mass lesion. Adrenals/Urinary Tract: No adrenal nodule or mass. Right kidney unremarkable 2 mm nonobstructing stone identified lower pole left kidney. No evidence for hydroureter. The urinary bladder appears normal for the degree of distention. Stomach/Bowel: Stomach is unremarkable. No gastric wall thickening. No evidence of outlet obstruction. Duodenum is normally positioned as is the ligament of Treitz. No small bowel wall thickening. No small bowel dilatation. The terminal ileum is normal. The appendix is normal. No gross colonic mass. No colonic wall thickening. Vascular/Lymphatic: No abdominal aortic aneurysm. No  abdominal aortic atherosclerotic calcification. There is no gastrohepatic or hepatoduodenal ligament lymphadenopathy. No intraperitoneal or retroperitoneal lymphadenopathy. No pelvic sidewall lymphadenopathy. Reproductive: Fibroid change noted in the uterus. There is no adnexal mass. Other: No intraperitoneal free fluid. Musculoskeletal: No worrisome lytic or sclerotic osseous abnormality. IMPRESSION: 1. No acute findings in the abdomen or pelvis. Specifically, no findings to explain the patient's history of abdominal pain. 2. Patchy airspace disease posterior right upper lobe, suspicious for pneumonia. 3. Probable cholelithiasis. 4. 2 mm nonobstructing stone lower pole left kidney. 5. Fibroid change in the uterus. Electronically Signed   By: Misty Stanley M.D.   On: 10/19/2018 16:42    Procedures Procedures (including critical care time)  Medications Ordered in ED Medications  sodium chloride 0.9 % bolus 1,000 mL (0 mLs Intravenous Stopped 10/19/18 1800)  iohexol (OMNIPAQUE) 300 MG/ML solution 100 mL (100 mLs Intravenous Contrast Given 10/19/18 1612)  acetaminophen (TYLENOL) tablet 650 mg (650 mg Oral Given 10/19/18 1823)  azithromycin (ZITHROMAX) tablet 500 mg (500 mg Oral Given 10/19/18 1940)     Initial Impression / Assessment and Plan / ED Course  I have reviewed the triage vital signs and the nursing notes.  Pertinent labs & imaging results that were available during my care of the patient were reviewed by me and considered in my medical decision making (see chart for details).     Patient presenting for evaluation of bright red blood noticed in the commode while having a bowel movement.  She is afebrile, initially tachycardic while in the ED.  Does appear quite anxious, recently evaluated for abnormal EKG which showed sinus arrhythmia.  Had a negative d-dimer upon work-up 3 days ago, doubt PE.  Symptoms sound more infectious in etiology, doubt ACS/MI.  Some abdominal tenderness in the left lower  quadrant and left upper quadrant and epigastric region.  No peritoneal signs.  On exam she does have external hemorrhoids with some irritation noted and bright red blood on digital rectal exam.  Point-of-care Hemoccult is unsurprisingly positive.  No evidence of thrombosed hemorrhoid or perirectal abscess.  Suspect that she is having some irritation of her hemorrhoids due to persistent diarrhea for the past few days however will obtain lab work and CT scan to rule out diverticulitis versus acute surgical  abdominal process.  Less likely thought to be upper GI bleed, she has had no coffee-ground emesis.  Lab work reviewed by me shows no leukocytosis, no anemia, stable H&H.  No metabolic derangements.  UA does not suggest UTI or nephrolithiasis.  4:00PM Signed out to oncoming provider PA Phylliss Bob.  Awaiting results of CT scan.  If no concerning findings, patient stable for discharge home with treatment for hemorrhoids. Final Clinical Impressions(s) / ED Diagnoses   Final diagnoses:  Blood in stool  Hemorrhoids, unspecified hemorrhoid type  Community acquired pneumonia of right upper lobe of lung ([place])  Calculus of gallbladder without cholecystitis without obstruction  Uterine leiomyoma, unspecified location    ED Discharge Orders         Ordered    azithromycin (ZITHROMAX) 250 MG tablet  Daily     10/19/18 2024    hydrocortisone (ANUSOL-HC) 2.5 % rectal cream     10/19/18 2024           Renita Papa, PA-C 10/20/18 0710    Gareth Morgan, MD 10/20/18 [phone]

## 2018-10-19 NOTE — ED Provider Notes (Signed)
I assumed care of patient from [facility] PA-C who presents today for evalution of bloody stools.  Please see her note for full H and P.  She has been seen recently for chest pain with normal x-rays and diagnosed with pleurisy.  Physical Exam  BP (!) 157/77   Pulse 67   Temp 97.7 F (36.5 C) (Oral)   Resp 18   Ht 5' (1.524 m)   Wt 106 kg   LMP 09/21/2018   SpO2 98%   BMI 45.64 kg/m   Physical Exam Vitals signs and nursing note reviewed.  HENT:     Head: Normocephalic.     Nose: No congestion or rhinorrhea.  Neck:     Musculoskeletal: Normal range of motion.  Cardiovascular:     Rate and Rhythm: Normal rate.     Pulses: Normal pulses.  Pulmonary:     Effort: Pulmonary effort is normal. No respiratory distress.  Neurological:     General: No focal deficit present.     Mental Status: She is alert. Mental status is at baseline.  Psychiatric:        Mood and Affect: Mood normal.        Behavior: Behavior normal.     ED Course/Procedures     Procedures   Ct Abdomen Pelvis W Contrast  Result Date: 10/19/2018 CLINICAL DATA:  Abdominal pain. EXAM: CT ABDOMEN AND PELVIS WITH CONTRAST TECHNIQUE: Multidetector CT imaging of the abdomen and pelvis was performed using the standard protocol following bolus administration of intravenous contrast. CONTRAST:  118mL OMNIPAQUE IOHEXOL 300 MG/ML  SOLN COMPARISON:  None. FINDINGS: Lower chest: Patchy airspace opacities identified posterior right upper lobe (2/4). Hepatobiliary: No suspicious focal abnormality within the liver parenchyma. Noncalcified stones are suspected in the gallbladder. No intrahepatic or extrahepatic biliary dilation. Pancreas: No focal mass lesion. No dilatation of the main duct. No intraparenchymal cyst. No peripancreatic edema. Spleen: No splenomegaly. No focal mass lesion. Adrenals/Urinary Tract: No adrenal nodule or mass. Right kidney unremarkable 2 mm nonobstructing stone identified lower pole left kidney. No evidence  for hydroureter. The urinary bladder appears normal for the degree of distention. Stomach/Bowel: Stomach is unremarkable. No gastric wall thickening. No evidence of outlet obstruction. Duodenum is normally positioned as is the ligament of Treitz. No small bowel wall thickening. No small bowel dilatation. The terminal ileum is normal. The appendix is normal. No gross colonic mass. No colonic wall thickening. Vascular/Lymphatic: No abdominal aortic aneurysm. No abdominal aortic atherosclerotic calcification. There is no gastrohepatic or hepatoduodenal ligament lymphadenopathy. No intraperitoneal or retroperitoneal lymphadenopathy. No pelvic sidewall lymphadenopathy. Reproductive: Fibroid change noted in the uterus. There is no adnexal mass. Other: No intraperitoneal free fluid. Musculoskeletal: No worrisome lytic or sclerotic osseous abnormality. IMPRESSION: 1. No acute findings in the abdomen or pelvis. Specifically, no findings to explain the patient's history of abdominal pain. 2. Patchy airspace disease posterior right upper lobe, suspicious for pneumonia. 3. Probable cholelithiasis. 4. 2 mm nonobstructing stone lower pole left kidney. 5. Fibroid change in the uterus. Electronically Signed   By: Misty Stanley M.D.   On: 10/19/2018 16:42    Labs Reviewed  CBC WITH DIFFERENTIAL/PLATELET - Abnormal; Notable for the following components:      Result Value   RBC 5.79 (*)    HCT 47.1 (*)    MCH 24.4 (*)    MCHC 29.9 (*)    RDW 16.9 (*)    Monocytes Absolute 1.3 (*)    All other components within normal  limits  COMPREHENSIVE METABOLIC PANEL - Abnormal; Notable for the following components:   Glucose, Bld 130 (*)    Total Bilirubin 0.1 (*)    All other components within normal limits  URINALYSIS, ROUTINE W REFLEX MICROSCOPIC - Abnormal; Notable for the following components:   Specific Gravity, Urine >1.046 (*)    All other components within normal limits  POC OCCULT BLOOD, ED - Abnormal; Notable for  the following components:   Fecal Occult Bld POSITIVE (*)    All other components within normal limits  LIPASE, BLOOD  I-STAT BETA HCG BLOOD, ED ([facility], [facility], [facility] ONLY)     MDM  Plan: If negative for diverticulitis treat for hemorrhoids.  Trend labs, r/o abd acute.  CT scan shows evidence of right upper lobe pneumonia that was not present on a chest x-ray recently.  Suspect that this is the cause for patient's pleuritic type chest pain.  Her CT scan does not show evidence of diverticulitis or cause for her symptoms.  We discussed all incidental findings.  Lab work does not show acute anemia.  Her pneumonia is treated with azithromycin in the department and given prescription to finish out azithromycin treatment.  Recommended conservative care for hemorrhoids, including Anusol, Preparation H/Tucks wipes.  Recommended using Tylenol as needed for pain, with addition of ibuprofen as needed.  Return precautions were discussed with patient who states their understanding.  At the time of discharge patient denied any unaddressed complaints or concerns.  Patient is agreeable for discharge home.        Lorin Glass, PA-C 10/20/18 Quentin Mulling    Gareth Morgan, MD 10/20/18 [phone]

## 2018-10-19 NOTE — ED Notes (Signed)
PT states understanding of care given, follow up care, and medication prescribed. PT ambulated from ED to car with a steady gait. 

## 2021-02-08 ENCOUNTER — Emergency Department (HOSPITAL_BASED_OUTPATIENT_CLINIC_OR_DEPARTMENT_OTHER): Payer: Medicaid Other

## 2021-02-08 ENCOUNTER — Other Ambulatory Visit: Payer: Self-pay

## 2021-02-08 ENCOUNTER — Emergency Department (HOSPITAL_BASED_OUTPATIENT_CLINIC_OR_DEPARTMENT_OTHER)
Admission: EM | Admit: 2021-02-08 | Discharge: 2021-02-08 | Disposition: A | Discharge status: Home / Self Care | Payer: Medicaid Other | Attending: Emergency Medicine | Admitting: Emergency Medicine

## 2021-02-08 ENCOUNTER — Encounter (HOSPITAL_BASED_OUTPATIENT_CLINIC_OR_DEPARTMENT_OTHER): Payer: Self-pay

## 2021-02-08 DIAGNOSIS — R000 Tachycardia, unspecified: Secondary | ICD-10-CM | POA: Diagnosis not present

## 2021-02-08 DIAGNOSIS — R0600 Dyspnea, unspecified: Secondary | ICD-10-CM | POA: Insufficient documentation

## 2021-02-08 DIAGNOSIS — K5900 Constipation, unspecified: Secondary | ICD-10-CM | POA: Diagnosis present

## 2021-02-08 DIAGNOSIS — R109 Unspecified abdominal pain: Secondary | ICD-10-CM | POA: Diagnosis not present

## 2021-02-08 DIAGNOSIS — R0602 Shortness of breath: Secondary | ICD-10-CM | POA: Insufficient documentation

## 2021-02-08 DIAGNOSIS — K8020 Calculus of gallbladder without cholecystitis without obstruction: Secondary | ICD-10-CM

## 2021-02-08 DIAGNOSIS — Z87891 Personal history of nicotine dependence: Secondary | ICD-10-CM | POA: Insufficient documentation

## 2021-02-08 LAB — CBC WITH DIFFERENTIAL/PLATELET
Abs Immature Granulocytes: 0 10*3/uL (ref 0.00–0.07)
Basophils Absolute: 0.2 10*3/uL — ABNORMAL HIGH (ref 0.0–0.1)
Basophils Relative: 1 %
Eosinophils Absolute: 0.2 10*3/uL (ref 0.0–0.5)
Eosinophils Relative: 1 %
HCT: 48.6 % — ABNORMAL HIGH (ref 36.0–46.0)
Hemoglobin: 14.8 g/dL (ref 12.0–15.0)
Lymphocytes Relative: 33 %
Lymphs Abs: 5.2 10*3/uL — ABNORMAL HIGH (ref 0.7–4.0)
MCH: 25.2 pg — ABNORMAL LOW (ref 26.0–34.0)
MCHC: 30.5 g/dL (ref 30.0–36.0)
MCV: 82.7 fL (ref 80.0–100.0)
Monocytes Absolute: 1.4 10*3/uL — ABNORMAL HIGH (ref 0.1–1.0)
Monocytes Relative: 9 %
Neutro Abs: 8.8 10*3/uL — ABNORMAL HIGH (ref 1.7–7.7)
Neutrophils Relative %: 56 %
Platelets: 307 10*3/uL (ref 150–400)
RBC: 5.88 MIL/uL — ABNORMAL HIGH (ref 3.87–5.11)
RDW: 18.8 % — ABNORMAL HIGH (ref 11.5–15.5)
WBC: 15.8 10*3/uL — ABNORMAL HIGH (ref 4.0–10.5)
nRBC: 0.4 % — ABNORMAL HIGH (ref 0.0–0.2)

## 2021-02-08 LAB — URINALYSIS, DIPSTICK ONLY
Bilirubin Urine: NEGATIVE
Glucose, UA: NEGATIVE mg/dL
Hgb urine dipstick: NEGATIVE
Ketones, ur: NEGATIVE mg/dL
Nitrite: NEGATIVE
Protein, ur: 30 mg/dL — AB
Specific Gravity, Urine: 1.01 (ref 1.005–1.030)
pH: 6 (ref 5.0–8.0)

## 2021-02-08 LAB — D-DIMER, QUANTITATIVE: D-Dimer, Quant: 0.95 ug/mL-FEU — ABNORMAL HIGH (ref 0.00–0.50)

## 2021-02-08 LAB — COMPREHENSIVE METABOLIC PANEL
ALT: 73 U/L — ABNORMAL HIGH (ref 0–44)
AST: 67 U/L — ABNORMAL HIGH (ref 15–41)
Albumin: 3.8 g/dL (ref 3.5–5.0)
Alkaline Phosphatase: 135 U/L — ABNORMAL HIGH (ref 38–126)
Anion gap: 13 (ref 5–15)
BUN: 14 mg/dL (ref 6–20)
CO2: 23 mmol/L (ref 22–32)
Calcium: 9 mg/dL (ref 8.9–10.3)
Chloride: 99 mmol/L (ref 98–111)
Creatinine, Ser: 0.85 mg/dL (ref 0.44–1.00)
GFR, Estimated: >60 mL/min (ref >60)
Glucose, Bld: 243 mg/dL — ABNORMAL HIGH (ref 70–99)
Potassium: 4.3 mmol/L (ref 3.5–5.1)
Sodium: 135 mmol/L (ref 135–145)
Total Bilirubin: 0.8 mg/dL (ref 0.3–1.2)
Total Protein: 6.8 g/dL (ref 6.5–8.1)

## 2021-02-08 LAB — PREGNANCY, URINE: Preg Test, Ur: NEGATIVE

## 2021-02-08 LAB — TROPONIN I (HIGH SENSITIVITY): Troponin I (High Sensitivity): 4 ng/L (ref <18)

## 2021-02-08 LAB — LIPASE, BLOOD: Lipase: 22 U/L (ref 11–51)

## 2021-02-08 MED ORDER — IOHEXOL 350 MG/ML SOLN
100.0000 mL | Freq: Once | INTRAVENOUS | Status: AC | PRN
  Administered 2021-02-08: 100 mL via INTRAVENOUS

## 2021-02-08 MED ORDER — LIDOCAINE VISCOUS HCL 2 % MT SOLN
15.0000 mL | Freq: Once | OROMUCOSAL | Status: AC
  Administered 2021-02-08: 15 mL via ORAL
  Filled 2021-02-08: qty 15

## 2021-02-08 MED ORDER — PANTOPRAZOLE SODIUM 40 MG PO TBEC: 40.0000 mg | DELAYED_RELEASE_TABLET | Freq: Every day | ORAL | 0 refills | Status: DC

## 2021-02-08 MED ORDER — ALUM & MAG HYDROXIDE-SIMETH 200-200-20 MG/5ML PO SUSP
30.0000 mL | Freq: Once | ORAL | Status: AC
  Administered 2021-02-08: 30 mL via ORAL
  Filled 2021-02-08: qty 30

## 2021-02-08 NOTE — ED Triage Notes (Signed)
Pt came in c/o of constipation and she believes the is has caused some abd swelling making her SHOB. Last BM was "smalll" and was this morning. Pt states she took Murelax last night and the night before with no relief. No NVD.

## 2021-02-08 NOTE — ED Provider Notes (Signed)
Care of the patient assumed at the change of shift pending dimer for dyspnea in setting of upper abdominal pain. US showed gallstones. Dimer was positive so sent for CTA which was neg for PE. She is resting comfortably. Pain reasonably well controlled no vomiting. Plan discharge home with outpatient follow up as previously planned.    Truddie Hidden, MD 02/08/21 Tresa Moore

## 2021-02-08 NOTE — Discharge Instructions (Signed)
You were seen in the emergency department for an episode of abdominal pain going on for several days, as well as bloating and difficulty breathing.  You had CT scans performed.  You also had blood work done.  We found evidence of gallstones inside your gallbladder.  We did not, however, see signs of significant inflammation around your gallbladder.  I discussed the importance of scheduling a follow-up appointment with a general surgeon.  Please call to make an appointment with Dr. Donne Hazel at the number above.    We talked about your risk factors for heart disease.  I did not see signs of a heart attack at this time.  However, I did feel it was important for you to follow-up with a cardiologist.  I did place a referral in our system.  If you do not hear from their office in 1-2 business days, please call the heart care number above to schedule an appointment.  Finally, if you have worsening of your symptoms, including difficulty breathing, lightheadedness, fevers, or new or worsening belly pain, please return to the ER immediately.

## 2021-02-08 NOTE — ED Provider Notes (Signed)
[facility] EMERGENC[facility] DEPT Provider Note   CSN: [id number] Arrival date & time: 02/08/21  1241     History CC: Constipation  Vickie Graham is a 45 y.o. female with history of obesity, HTN presenting to emergency department with constipation and abdominal pain and dyspnea.  She reports onset of symptoms approximately 8 to 9 days ago after eating a meal with cheese and gravy, stating that "cheese tends to really block me up".  She says since then she has had a lot of difficulty and straining with bowel movements.  She is able to pass only a very small bowel movement each day with a lot of effort.  She does feel nauseated and is belching.  She feels like her abdomen is distended, and she reports a band of abdominal pressure.  For constipation, she has taken several rounds of MiraLAX.  She drinks a lot of water every day.  She has not tried an enema or a rectal suppository.  She denies any history of abdominal surgery or bowel obstruction.  She denies any dysuria, difficulty urinating.   She also reports dyspnea with exertion, worsening for several days.  She feels she is having difficulty even walking around at work because "I can't catch my breath, I feel winded."  She reports that approximately 4 days ago, after eating guacamole, she began to feel suddenly nauseated.  She had an episode of soreness in both of her arms and vomited, then the soreness resolved.  She denies any personal family history of MI or cardiac disease.  She does report a history of poorly controlled high blood pressure, no other chronic conditions.  She reports may be prediabetic.  She denies known hx of DVT or PE, or recent surgeries.  She reports her heart rate can range from 80-150 bpm per her Apple watch report.  HPI     Past Medical History:  Diagnosis Date  . ADHD (attention deficit hyperactivity disorder)    diagnosed at [facility] clinic.  Marland Kitchen Allergy   . Anxiety    Social anxiety, generalized  anxiety disorder.  . Depression     Patient Active Problem List   Diagnosis Date Noted  . Severe obesity (BMI >= 40) ([place]) 12/13/2013    History reviewed. No pertinent surgical history.   OB History   No obstetric history on file.     No family history on file.  Social History   Tobacco Use  . Smoking status: Former Smoker    Quit date: 01/12/2010    Years since quitting: 11.0  . Smokeless tobacco: Never Used  Vaping Use  . Vaping Use: Never used  Substance Use Topics  . Alcohol use: No  . Drug use: No    Home Medications Prior to Admission medications   Medication Sig Start Date End Date Taking? Authorizing Provider  hydrocortisone (ANUSOL-HC) 2.5 % rectal cream Apply rectally 2 times daily 10/19/18   Lorin Glass, PA-C    Allergies    Patient has no known allergies.  Review of Systems   Review of Systems  Constitutional: Negative for chills and fever.  Eyes: Negative for photophobia and visual disturbance.  Respiratory: Positive for shortness of breath. Negative for cough.   Cardiovascular: Negative for chest pain and palpitations.  Gastrointestinal: Positive for abdominal distention, constipation, nausea and vomiting. Negative for abdominal pain, diarrhea and rectal pain.  Genitourinary: Negative for dysuria and hematuria.  Musculoskeletal: Negative for arthralgias and myalgias.  Skin: Negative for color change and  rash.  Neurological: Negative for syncope and headaches.  All other systems reviewed and are negative.   Physical Exam Updated Vital Signs BP (!) 129/95 (BP Location: Right Arm)   Pulse 78   Resp 16   Ht 5' (1.524 m)   Wt 136.1 kg   SpO2 99%   BMI 58.59 kg/m   Physical Exam Constitutional:      General: She is not in acute distress.    Appearance: She is obese.  HENT:     Head: Normocephalic and atraumatic.  Eyes:     Conjunctiva/sclera: Conjunctivae normal.     Pupils: Pupils are equal, round, and reactive to light.   Cardiovascular:     Rate and Rhythm: Regular rhythm. Tachycardia present.     Comments: HR 90-110 bpm and regular Pulmonary:     Effort: Pulmonary effort is normal. No respiratory distress.  Abdominal:     General: There is no distension.     Tenderness: There is no abdominal tenderness. There is no guarding or rebound. Negative signs include Murphy's sign and McBurney's sign.  Skin:    General: Skin is warm and dry.  Neurological:     General: No focal deficit present.     Mental Status: She is alert. Mental status is at baseline.  Psychiatric:        Mood and Affect: Mood normal.        Behavior: Behavior normal.     ED Results / Procedures / Treatments   Labs (all labs ordered are listed, but only abnormal results are displayed) Labs Reviewed - No data to display  EKG None  Radiology No results found.  Procedures Procedures   Medications Ordered in ED Medications - No data to display  ED Course  I have reviewed the triage vital signs and the nursing notes.  Pertinent labs & imaging results that were available during my care of the patient were reviewed by me and considered in my medical decision making (see chart for details).  45 year old female present emergency department with constipation and difficulty with bowel movements for 9 days, and dyspnea.  This may be related to dietary issues versus electrolyte derangement versus ileus versus bowel obstruction versus pulm infection vs PE vs other.    I reviewed her ECG on arrival, which appears to show a sinus arrhythmia with HR fluctuating around 100-110 bpm at rest.  It's not clear whether this is truly her baseline level or a new finding.  Will need to check labs including liver enzyme levels, electrolyte levels.  We will get a CT scan of the abdomen for abdominal bloating to evaluate for SBO/colitis.    She reported an episode of vomiting and arm soreness a few days ago.  She does have some cardiac risk factors,  but her troponin is 4 today, with multiple days of symptoms.  Her ECG is as noted above, with some anterior q waves that were seen on prior two years of ECGs.  Although I have a lower suspicion for MI/unstable angina at this time, we discussed her risk factors for heart disease, and I will place a referral to cardiology for an office evaluation.  She agrees with this plan.   Clinical Course as of 02/08/21 1725  Wed Feb 08, 2021  1611 I discussed the CT abdomen with the patient.  No evidence of bowel obstruction or significant constipation.  She continues to complain of epigastric discomfort and bloating.,  Subsequently ordered a gallbladder ultrasound, given that  she had a very mild transaminitis.  Her ultrasound showed a decompressed gallbladder with gallstones, but no other stigmata of acute cholecystitis, CBD only 7 mm.  I have given her a GI cocktail for possible gastritis. [MT]  4604 Because she is tachycardic, describes worsening dyspnea, I felt it was reasonable to obtain a D-dimer.  We discussed a PE work-up and what that would entail.  She is in agreement with this plan.  I have signed her out to Dr Karle Starch EDP pending Ddimer results and reassessment. [MT]  1614 I also discussed referral to a cardiologist given her risk factors for heart disease and her episode of possible chest pain earlier, and referral to general surgery for outpatient biliary disease follow up [MT]    Clinical Course User Index [MT] Naphtali Zywicki, Carola Rhine, MD    Final Clinical Impression(s) / ED Diagnoses Final diagnoses:  None    Rx / DC Orders ED Discharge Orders    None       Wyvonnia Dusky, MD 02/08/21 1730

## 2021-02-09 ENCOUNTER — Ambulatory Visit
Admission: EM | Admit: 2021-02-09 | Discharge: 2021-02-09 | Disposition: A | Discharge status: Home / Self Care | Payer: Medicaid Other | Attending: Family Medicine | Admitting: Family Medicine

## 2021-02-09 DIAGNOSIS — K2100 Gastro-esophageal reflux disease with esophagitis, without bleeding: Secondary | ICD-10-CM | POA: Diagnosis present

## 2021-02-09 DIAGNOSIS — J392 Other diseases of pharynx: Secondary | ICD-10-CM | POA: Diagnosis present

## 2021-02-09 LAB — POCT RAPID STREP A (OFFICE): Rapid Strep A Screen: NEGATIVE

## 2021-02-09 MED ORDER — ALUM & MAG HYDROXIDE-SIMETH 200-200-20 MG/5ML PO SUSP
30.0000 mL | Freq: Once | ORAL | Status: AC
  Administered 2021-02-09: 30 mL via ORAL

## 2021-02-09 MED ORDER — SUCRALFATE 1 G PO TABS: 1.0000 g | ORAL_TABLET | Freq: Three times a day (TID) | ORAL | 0 refills | Status: DC

## 2021-02-09 MED ORDER — LIDOCAINE VISCOUS HCL 2 % MT SOLN
15.0000 mL | Freq: Once | OROMUCOSAL | Status: AC
  Administered 2021-02-09: 15 mL via ORAL

## 2021-02-09 NOTE — ED Triage Notes (Signed)
Seen at the ED yesterday for constipation.  Greater than 1 week h/o throat pain. Describes pain as "something stuck" in her throat. Throat pain is worse at night and increases on position changes. Pain on swallowing. Denies nausea, cough and congestion. Two episodes of emesis last week.

## 2021-02-09 NOTE — Discharge Instructions (Signed)
The medication given at the emergency department yesterday called Protonix is the heartburn medication that we spoke about.  I have also sent in the Carafate that we spoke about which you can dissolve in a glass of water and drink prior to meals to help coat your GI tract.

## 2021-02-09 NOTE — ED Provider Notes (Addendum)
[facility]    CSN: [id number] Arrival date & time: 02/09/21  1045      History   Chief Complaint Chief Complaint  Patient presents with  . Sore Throat    HPI Vickie Graham is a 45 y.o. female.   Patient presenting today complaining of sore throat for the past week or 2.  States that the symptoms seem to come on when she lays down to go to bed at night and are relieved by propping up.  Sometimes made worse with eating or drinking.  States it sometimes feels like a burning sensation up from the esophagus.  She was seen in the ED yesterday for abdominal pain, bloating and what she thought was constipation.  After significant work-up and imaging it was hypothesized that her gallstones may have been causing her symptoms.  She was given Protonix and a GI cocktail which did help with her throat symptoms per patient.  She states she forgot to tell them about her sore throat which is why she came back today for more evaluation.  She denies fever, chills, dysphagia, difficulty breathing, unexpected weight loss, hematemesis, melena, history of GERD or other chronic GI issues known.  Has not been taking anything over-the-counter for symptoms.    Past Medical History:  Diagnosis Date  . ADHD (attention deficit hyperactivity disorder)    diagnosed at [facility] clinic.  Marland Kitchen Allergy   . Anxiety    Social anxiety, generalized anxiety disorder.  . Depression     Patient Active Problem List   Diagnosis Date Noted  . Severe obesity (BMI >= 40) ([place]) 12/13/2013    History reviewed. No pertinent surgical history.  OB History   No obstetric history on file.      Home Medications    Prior to Admission medications   Medication Sig Start Date End Date Taking? Authorizing Provider  sucralfate (CARAFATE) 1 g tablet Take 1 tablet (1 g total) by mouth 4 (four) times daily -  with meals and at bedtime. Dissolve 1 tablet into a glass of water and drink 02/09/21  Yes Volney American,  PA-C  hydrocortisone (ANUSOL-HC) 2.5 % rectal cream Apply rectally 2 times daily 10/19/18   Lorin Glass, PA-C  pantoprazole (PROTONIX) 40 MG tablet Take 1 tablet (40 mg total) by mouth daily. 02/08/21   Truddie Hidden, MD    Family History History reviewed. No pertinent family history.  Social History Social History   Tobacco Use  . Smoking status: Former Smoker    Quit date: 01/12/2010    Years since quitting: 11.0  . Smokeless tobacco: Never Used  Vaping Use  . Vaping Use: Never used  Substance Use Topics  . Alcohol use: No  . Drug use: No     Allergies   Patient has no known allergies.   Review of Systems Review of Systems Per HPI  Physical Exam Triage Vital Signs ED Triage Vitals  Enc Vitals Group     BP 02/09/21 1150 (!) 121/93     Pulse Rate 02/09/21 1205 64     Resp 02/09/21 1150 18     Temp 02/09/21 1150 98.2 F (36.8 C)     Temp Source 02/09/21 1150 Oral     SpO2 02/09/21 1150 95 %     Weight --      Height --      Head Circumference --      Peak Flow --      Pain Score  02/09/21 1158 3     Pain Loc --      Pain Edu? --      Excl. in [place]? --    No data found.  Updated Vital Signs BP (!) 121/93 (BP Location: Left Arm)   Pulse 64   Temp 98.2 F (36.8 C) (Oral)   Resp 18   SpO2 95%   Visual Acuity Right Eye Distance:   Left Eye Distance:   Bilateral Distance:    Right Eye Near:   Left Eye Near:    Bilateral Near:     Physical Exam Vitals and nursing note reviewed.  Constitutional:      Appearance: Normal appearance. She is not ill-appearing.  HENT:     Head: Atraumatic.     Right Ear: Tympanic membrane normal.     Left Ear: Tympanic membrane normal.     Nose: Nose normal.     Mouth/Throat:     Mouth: Mucous membranes are moist.     Pharynx: Posterior oropharyngeal erythema present.     Comments: Mild posterior oropharyngeal erythema, no tonsillar edema or exudates Oral airway patent, uvula midline Eyes:     Extraocular  Movements: Extraocular movements intact.     Conjunctiva/sclera: Conjunctivae normal.  Neck:     Comments: No thyromegaly or nodules palpable Cardiovascular:     Rate and Rhythm: Normal rate and regular rhythm.     Heart sounds: Normal heart sounds.  Pulmonary:     Effort: Pulmonary effort is normal.     Breath sounds: Normal breath sounds.  Abdominal:     General: Bowel sounds are normal. There is no distension.     Palpations: Abdomen is soft.     Tenderness: There is no abdominal tenderness. There is no right CVA tenderness, left CVA tenderness or guarding.  Musculoskeletal:        General: Normal range of motion.     Cervical back: Normal range of motion and neck supple.  Lymphadenopathy:     Cervical: No cervical adenopathy.  Skin:    General: Skin is warm and dry.  Neurological:     Mental Status: She is alert and oriented to person, place, and time.  Psychiatric:        Mood and Affect: Mood normal.        Thought Content: Thought content normal.        Judgment: Judgment normal.    UC Treatments / Results  Labs (all labs ordered are listed, but only abnormal results are displayed) Labs Reviewed  CULTURE, GROUP A STREP [facility])  POCT RAPID STREP A (OFFICE)    EKG   Radiology CT ABDOMEN PELVIS WO CONTRAST  Result Date: 02/08/2021 CLINICAL DATA:  Abdominal distension, constipation. EXAM: CT ABDOMEN AND PELVIS WITHOUT CONTRAST TECHNIQUE: Multidetector CT imaging of the abdomen and pelvis was performed following the standard protocol without IV contrast. COMPARISON:  October 19, 2018. FINDINGS: Lower chest: No acute abnormality. Hepatobiliary: No gallstones or biliary dilatation is noted. Hepatic steatosis is noted. Pancreas: Unremarkable. No pancreatic ductal dilatation or surrounding inflammatory changes. Spleen: Normal in size without focal abnormality. Adrenals/Urinary Tract: Small nonobstructive calculus is noted in lower pole collecting system of left kidney. Adrenal  glands appear normal. No hydronephrosis or renal obstruction is noted. Urinary bladder is decompressed. Stomach/Bowel: Stomach is within normal limits. Appendix appears normal. No evidence of bowel wall thickening, distention, or inflammatory changes. Vascular/Lymphatic: No significant vascular findings are present. No enlarged abdominal or pelvic lymph nodes. Reproductive:  Uterus and bilateral adnexa are unremarkable. Other: No abdominal wall hernia or abnormality. No abdominopelvic ascites. Musculoskeletal: No acute or significant osseous findings. IMPRESSION: Small nonobstructive left renal calculus. No hydronephrosis or renal obstruction is noted. Hepatic steatosis. Electronically Signed   By: Marijo Conception M.D.   On: 02/08/2021 14:32   CT Angio Chest PE W/Cm &/Or Wo Cm  Result Date: 02/08/2021 CLINICAL DATA:  Dyspnea with exertion.  Positive D-dimer. EXAM: CT ANGIOGRAPHY CHEST WITH CONTRAST TECHNIQUE: Multidetector CT imaging of the chest was performed using the standard protocol during bolus administration of intravenous contrast. Multiplanar CT image reconstructions and MIPs were obtained to evaluate the vascular anatomy. CONTRAST:  153mL OMNIPAQUE IOHEXOL 350 MG/ML SOLN COMPARISON:  Radiograph same day. FINDINGS: Cardiovascular: Satisfactory opacification of the pulmonary arteries to the segmental level. No evidence of pulmonary embolism. Normal heart size. No pericardial effusion. Mediastinum/Nodes: No enlarged mediastinal, hilar, or axillary lymph nodes. Thyroid gland, trachea, and esophagus demonstrate no significant findings. Lungs/Pleura: Lungs are clear. No pleural effusion or pneumothorax. Upper Abdomen: No acute abnormality. Musculoskeletal: No chest wall abnormality. No acute or significant osseous findings. Review of the MIP images confirms the above findings. IMPRESSION: No definite evidence of pulmonary embolus. No acute abnormality seen in the chest. Electronically Signed   By: Marijo Conception M.D.   On: 02/08/2021 18:12   DG Chest Portable 1 View  Result Date: 02/08/2021 CLINICAL DATA:  Shortness of breath and abdominal swelling. EXAM: PORTABLE CHEST 1 VIEW COMPARISON:  PA and lateral chest 10/16/2018. FINDINGS: Lungs are clear. Heart size is normal. No pneumothorax or pleural fluid. No acute or focal bony abnormality. IMPRESSION: No acute disease. Electronically Signed   By: Inge Rise M.D.   On: 02/08/2021 14:01   US Abdomen Limited RUQ (LIVER/GB)  Result Date: 02/08/2021 CLINICAL DATA:  Upper abdominal pain EXAM: ULTRASOUND ABDOMEN LIMITED RIGHT UPPER QUADRANT COMPARISON:  CT from earlier in the same day. FINDINGS: Gallbladder: Gallbladder is decompressed with wall echo shadow sign consistent with cholelithiasis. No pericholecystic fluid is noted. Negative sonographic Murphy's sign is elicited. Common bile duct: Diameter: 7 mm. Liver: Increased in echogenicity consistent with fatty infiltration. Hypoechoic lesion is noted in the left lobe measuring 1.4 cm. This was not well appreciated on the prior CT examination. Portal vein is patent on color Doppler imaging with normal direction of blood flow towards the liver. Other: None. IMPRESSION: Changes consistent with a decompressed gallbladder full of gallstones. No other complicating factors are noted. Hypoechoic lesion within the left lobe of the liver measuring 1.4 cm. This may simply represent a complex cyst although incompletely evaluated on this exam. Short-term follow-up ultrasound in 6 months is recommended. Electronically Signed   By: Inez Catalina M.D.   On: 02/08/2021 15:56    Procedures Procedures (including critical care time)  Medications Ordered in UC Medications  alum & mag hydroxide-simeth (MAALOX/MYLANTA) 200-200-20 MG/5ML suspension 30 mL (30 mLs Oral Given 02/09/21 1239)    And  lidocaine (XYLOCAINE) 2 % viscous mouth solution 15 mL (15 mLs Oral Given 02/09/21 1239)    Initial Impression / Assessment and  Plan / UC Course  I have reviewed the triage vital signs and the nursing notes.  Pertinent labs & imaging results that were available during my care of the patient were reviewed by me and considered in my medical decision making (see chart for details).     GI cocktail given in clinic today as this did help yesterday in the ED.  Suspect reflux esophagitis to be causing her throat irritation and pain.  Rapid strep negative, throat culture pending.  Take the Protonix given yesterday daily before breakfast and will also provide Carafate for use as needed prior to meals to help soothe the GI tract.  Discussed food modifications to additionally help with symptoms.  Close outpatient follow-up with primary care reviewed for recheck symptoms and further evaluation if needed.  Return for acutely worsening symptoms in the meantime.  Final Clinical Impressions(s) / UC Diagnoses   Final diagnoses:  Throat irritation  Gastroesophageal reflux disease with esophagitis, unspecified whether hemorrhage     Discharge Instructions     The medication given at the emergency department yesterday called Protonix is the heartburn medication that we spoke about.  I have also sent in the Carafate that we spoke about which you can dissolve in a glass of water and drink prior to meals to help coat your GI tract.    ED Prescriptions    Medication Sig Dispense Auth. Provider   sucralfate (CARAFATE) 1 g tablet Take 1 tablet (1 g total) by mouth 4 (four) times daily -  with meals and at bedtime. Dissolve 1 tablet into a glass of water and drink 120 tablet Volney American, [place]     PDMP not reviewed this encounter.   Volney American, PA-C 02/09/21 1249    Volney American, [place] 02/09/21 1250

## 2021-02-12 LAB — CULTURE, GROUP A STREP (THRC)

## 2021-02-13 ENCOUNTER — Emergency Department (HOSPITAL_COMMUNITY): Payer: Medicaid Other

## 2021-02-13 ENCOUNTER — Encounter (HOSPITAL_COMMUNITY): Payer: Self-pay

## 2021-02-13 ENCOUNTER — Other Ambulatory Visit: Payer: Self-pay

## 2021-02-13 ENCOUNTER — Inpatient Hospital Stay (HOSPITAL_COMMUNITY)
Admission: EM | Admit: 2021-02-13 | Discharge: 2021-02-20 | DRG: 287 | Disposition: A | Discharge status: Home / Self Care | Payer: Medicaid Other | Attending: Family Medicine | Admitting: Family Medicine

## 2021-02-13 DIAGNOSIS — E785 Hyperlipidemia, unspecified: Secondary | ICD-10-CM | POA: Diagnosis present

## 2021-02-13 DIAGNOSIS — F509 Eating disorder, unspecified: Secondary | ICD-10-CM | POA: Diagnosis present

## 2021-02-13 DIAGNOSIS — N179 Acute kidney failure, unspecified: Secondary | ICD-10-CM | POA: Diagnosis not present

## 2021-02-13 DIAGNOSIS — Z6841 Body Mass Index (BMI) 40.0 and over, adult: Secondary | ICD-10-CM

## 2021-02-13 DIAGNOSIS — I428 Other cardiomyopathies: Secondary | ICD-10-CM | POA: Diagnosis present

## 2021-02-13 DIAGNOSIS — E876 Hypokalemia: Secondary | ICD-10-CM | POA: Diagnosis not present

## 2021-02-13 DIAGNOSIS — R079 Chest pain, unspecified: Secondary | ICD-10-CM | POA: Diagnosis present

## 2021-02-13 DIAGNOSIS — I509 Heart failure, unspecified: Secondary | ICD-10-CM

## 2021-02-13 DIAGNOSIS — E1165 Type 2 diabetes mellitus with hyperglycemia: Secondary | ICD-10-CM | POA: Diagnosis present

## 2021-02-13 DIAGNOSIS — I471 Supraventricular tachycardia: Secondary | ICD-10-CM | POA: Diagnosis not present

## 2021-02-13 DIAGNOSIS — I5021 Acute systolic (congestive) heart failure: Secondary | ICD-10-CM

## 2021-02-13 DIAGNOSIS — K219 Gastro-esophageal reflux disease without esophagitis: Secondary | ICD-10-CM | POA: Diagnosis present

## 2021-02-13 DIAGNOSIS — I272 Pulmonary hypertension, unspecified: Secondary | ICD-10-CM | POA: Diagnosis present

## 2021-02-13 DIAGNOSIS — F909 Attention-deficit hyperactivity disorder, unspecified type: Secondary | ICD-10-CM | POA: Diagnosis present

## 2021-02-13 DIAGNOSIS — Z20822 Contact with and (suspected) exposure to covid-19: Secondary | ICD-10-CM | POA: Diagnosis present

## 2021-02-13 DIAGNOSIS — R109 Unspecified abdominal pain: Secondary | ICD-10-CM

## 2021-02-13 DIAGNOSIS — I4719 Other supraventricular tachycardia: Secondary | ICD-10-CM

## 2021-02-13 DIAGNOSIS — Z87891 Personal history of nicotine dependence: Secondary | ICD-10-CM

## 2021-02-13 DIAGNOSIS — E781 Pure hyperglyceridemia: Secondary | ICD-10-CM | POA: Diagnosis present

## 2021-02-13 DIAGNOSIS — E039 Hypothyroidism, unspecified: Secondary | ICD-10-CM

## 2021-02-13 DIAGNOSIS — I5023 Acute on chronic systolic (congestive) heart failure: Principal | ICD-10-CM | POA: Diagnosis present

## 2021-02-13 DIAGNOSIS — Z882 Allergy status to sulfonamides status: Secondary | ICD-10-CM

## 2021-02-13 LAB — CBC WITH DIFFERENTIAL/PLATELET
Abs Immature Granulocytes: 0.05 10*3/uL (ref 0.00–0.07)
Basophils Absolute: 0.1 10*3/uL (ref 0.0–0.1)
Basophils Relative: 1 %
Eosinophils Absolute: 0.1 10*3/uL (ref 0.0–0.5)
Eosinophils Relative: 1 %
HCT: 45.4 % (ref 36.0–46.0)
Hemoglobin: 13.8 g/dL (ref 12.0–15.0)
Immature Granulocytes: 0 %
Lymphocytes Relative: 22 %
Lymphs Abs: 2.8 10*3/uL (ref 0.7–4.0)
MCH: 25 pg — ABNORMAL LOW (ref 26.0–34.0)
MCHC: 30.4 g/dL (ref 30.0–36.0)
MCV: 82.4 fL (ref 80.0–100.0)
Monocytes Absolute: 0.8 10*3/uL (ref 0.1–1.0)
Monocytes Relative: 7 %
Neutro Abs: 8.6 10*3/uL — ABNORMAL HIGH (ref 1.7–7.7)
Neutrophils Relative %: 69 %
Platelets: 283 10*3/uL (ref 150–400)
RBC: 5.51 MIL/uL — ABNORMAL HIGH (ref 3.87–5.11)
RDW: 17.2 % — ABNORMAL HIGH (ref 11.5–15.5)
WBC: 12.5 10*3/uL — ABNORMAL HIGH (ref 4.0–10.5)
nRBC: 0 % (ref 0.0–0.2)

## 2021-02-13 LAB — I-STAT BETA HCG BLOOD, ED (MC, WL, AP ONLY): I-stat hCG, quantitative: <5 m[IU]/mL (ref <5)

## 2021-02-13 LAB — COMPREHENSIVE METABOLIC PANEL
ALT: 57 U/L — ABNORMAL HIGH (ref 0–44)
AST: 49 U/L — ABNORMAL HIGH (ref 15–41)
Albumin: 3 g/dL — ABNORMAL LOW (ref 3.5–5.0)
Alkaline Phosphatase: 89 U/L (ref 38–126)
Anion gap: 10 (ref 5–15)
BUN: 14 mg/dL (ref 6–20)
CO2: 21 mmol/L — ABNORMAL LOW (ref 22–32)
Calcium: 8.8 mg/dL — ABNORMAL LOW (ref 8.9–10.3)
Chloride: 103 mmol/L (ref 98–111)
Creatinine, Ser: 0.83 mg/dL (ref 0.44–1.00)
GFR, Estimated: >60 mL/min (ref >60)
Glucose, Bld: 202 mg/dL — ABNORMAL HIGH (ref 70–99)
Potassium: 3.9 mmol/L (ref 3.5–5.1)
Sodium: 134 mmol/L — ABNORMAL LOW (ref 135–145)
Total Bilirubin: 0.9 mg/dL (ref 0.3–1.2)
Total Protein: 5.9 g/dL — ABNORMAL LOW (ref 6.5–8.1)

## 2021-02-13 LAB — BASIC METABOLIC PANEL
Anion gap: 13 (ref 5–15)
BUN: 14 mg/dL (ref 6–20)
CO2: 20 mmol/L — ABNORMAL LOW (ref 22–32)
Calcium: 8.2 mg/dL — ABNORMAL LOW (ref 8.9–10.3)
Chloride: 98 mmol/L (ref 98–111)
Creatinine, Ser: 0.99 mg/dL (ref 0.44–1.00)
GFR, Estimated: >60 mL/min (ref >60)
Glucose, Bld: 271 mg/dL — ABNORMAL HIGH (ref 70–99)
Potassium: 3.8 mmol/L (ref 3.5–5.1)
Sodium: 131 mmol/L — ABNORMAL LOW (ref 135–145)

## 2021-02-13 LAB — T4, FREE: Free T4: 0.78 ng/dL (ref 0.61–1.12)

## 2021-02-13 LAB — GLUCOSE, CAPILLARY: Glucose-Capillary: 218 mg/dL — ABNORMAL HIGH (ref 70–99)

## 2021-02-13 LAB — RESP PANEL BY RT-PCR (FLU A&B, COVID) ARPGX2
Influenza A by PCR: NEGATIVE
Influenza B by PCR: NEGATIVE
SARS Coronavirus 2 by RT PCR: NEGATIVE

## 2021-02-13 LAB — TROPONIN I (HIGH SENSITIVITY)
Troponin I (High Sensitivity): 48 ng/L — ABNORMAL HIGH (ref <18)
Troponin I (High Sensitivity): 50 ng/L — ABNORMAL HIGH (ref <18)
Troponin I (High Sensitivity): 45 ng/L — ABNORMAL HIGH (ref <18)
Troponin I (High Sensitivity): 55 ng/L — ABNORMAL HIGH (ref <18)

## 2021-02-13 LAB — BRAIN NATRIURETIC PEPTIDE: B Natriuretic Peptide: 1388.9 pg/mL — ABNORMAL HIGH (ref 0.0–100.0)

## 2021-02-13 LAB — HIV ANTIBODY (ROUTINE TESTING W REFLEX): HIV Screen 4th Generation wRfx: NONREACTIVE

## 2021-02-13 LAB — TSH: TSH: 13.416 u[IU]/mL — ABNORMAL HIGH (ref 0.350–4.500)

## 2021-02-13 MED ORDER — ACETAMINOPHEN 650 MG RE SUPP: 650.0000 mg | Freq: Four times a day (QID) | RECTAL | Status: DC | PRN

## 2021-02-13 MED ORDER — FUROSEMIDE 10 MG/ML IJ SOLN
40.0000 mg | Freq: Once | INTRAMUSCULAR | Status: AC
  Administered 2021-02-13: 40 mg via INTRAVENOUS
  Filled 2021-02-13: qty 4

## 2021-02-13 MED ORDER — ENOXAPARIN SODIUM 80 MG/0.8ML IJ SOSY
65.0000 mg | PREFILLED_SYRINGE | INTRAMUSCULAR | Status: DC
  Administered 2021-02-13 – 2021-02-15 (×3): 65 mg via SUBCUTANEOUS
  Filled 2021-02-13: qty 0.8
  Filled 2021-02-13: qty 0.65
  Filled 2021-02-13 (×2): qty 0.8

## 2021-02-13 MED ORDER — ASPIRIN 81 MG PO CHEW
324.0000 mg | CHEWABLE_TABLET | Freq: Once | ORAL | Status: AC
  Administered 2021-02-13: 324 mg via ORAL
  Filled 2021-02-13: qty 4

## 2021-02-13 MED ORDER — ACETAMINOPHEN 325 MG PO TABS: 650.0000 mg | ORAL_TABLET | Freq: Four times a day (QID) | ORAL | Status: DC | PRN

## 2021-02-13 MED ORDER — NITROGLYCERIN 0.4 MG SL SUBL: 0.4000 mg | SUBLINGUAL_TABLET | SUBLINGUAL | Status: DC | PRN

## 2021-02-13 MED ORDER — METOPROLOL TARTRATE 12.5 MG HALF TABLET
12.5000 mg | ORAL_TABLET | Freq: Every day | ORAL | Status: DC
  Administered 2021-02-13: 12.5 mg via ORAL
  Filled 2021-02-13: qty 1

## 2021-02-13 MED ORDER — INSULIN ASPART 100 UNIT/ML IJ SOLN
0.0000 [IU] | Freq: Three times a day (TID) | INTRAMUSCULAR | Status: DC
  Administered 2021-02-14: 8 [IU] via SUBCUTANEOUS
  Administered 2021-02-14: 5 [IU] via SUBCUTANEOUS
  Administered 2021-02-14 – 2021-02-16 (×6): 3 [IU] via SUBCUTANEOUS
  Administered 2021-02-16: 5 [IU] via SUBCUTANEOUS
  Administered 2021-02-17 (×2): 3 [IU] via SUBCUTANEOUS
  Administered 2021-02-17: 15 [IU] via SUBCUTANEOUS
  Administered 2021-02-17: 5 [IU] via SUBCUTANEOUS
  Administered 2021-02-17 – 2021-02-18 (×2): 3 [IU] via SUBCUTANEOUS
  Administered 2021-02-18 – 2021-02-19 (×3): 5 [IU] via SUBCUTANEOUS
  Administered 2021-02-19 – 2021-02-20 (×4): 3 [IU] via SUBCUTANEOUS

## 2021-02-13 NOTE — ED Provider Notes (Signed)
[facility] EMERGENCY DEPARTMENT Provider Note   CSN: [id number] Arrival date & time: 02/13/21  1018     History Chief Complaint  Patient presents with  . Shortness of Breath    Vickie Graham is a 45 y.o. female.  HPI      Was seen 5/25 with concern for abdominal distention and discomfort, thought was constipation but diagnosed with gallbladder issue Thursday had sore throat and went to urgent care, thought due to acid reflux, given antacid and GI cocktail  Last night tight in top of abdomen, legs, feet and hands swelling Doesn't matter how much water she drinks, still feels swollen, feels dry mouth, but when swelling goes away wouldn't feel that way Strep test negative. Not having sore throat any more.   Symptoms started 5/14 this time. Overall not sure how long this has been going on, reports having swelling of abdomen and would eat certain foods to try to alleviate it because thought was constipation, probably at least 6 months.  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  Not feeling short of breath laying down flat in bed.  Shortness of breath feels like it is coming from inability to take a deep breath from abdominal swelling.  No cough, no fever, no body aches Congestion over last week, mild Fatigue, has been home since Thursday Swelling, dry mouth, fatigue Mild RUQ abdominal pain 1-2/10, comes and goes every now and then, not current, not severe Last Saturday had bilateral arm pain and vomiting and after vomiting felt better. Was one hour.  If eats more sodium heart rate elevated Past Medical History:  Diagnosis Date  . ADHD (attention deficit hyperactivity disorder)    diagnosed at [facility] clinic.  Marland Kitchen Allergy   . Anxiety    Social anxiety, generalized anxiety disorder.  . Depression     Patient Active Problem List   Diagnosis Date Noted  . Chest pain 02/13/2021  . Severe obesity (BMI >=  40) ([place]) 12/13/2013    History reviewed. No pertinent surgical history.   OB History   No obstetric history on file.     History reviewed. No pertinent family history.  Social History   Tobacco Use  . Smoking status: Former Smoker    Quit date: 01/12/2010    Years since quitting: 11.0  . Smokeless tobacco: Never Used  Vaping Use  . Vaping Use: Never used  Substance Use Topics  . Alcohol use: No  . Drug use: No    Home Medications Prior to Admission medications   Medication Sig Start Date End Date Taking? Authorizing Provider  pantoprazole (PROTONIX) 40 MG tablet Take 1 tablet (40 mg total) by mouth daily. 02/08/21  Yes Truddie Hidden, MD  sucralfate (CARAFATE) 1 g tablet Take 1 tablet (1 g total) by mouth 4 (four) times daily -  with meals and at bedtime. Dissolve 1 tablet into a glass of water and drink 02/09/21  Yes Volney American, PA-C    Allergies    Sulfa antibiotics  Review of Systems   Review of Systems  Constitutional: Positive for fatigue.  HENT: Positive for congestion and sore throat (improved).   Respiratory: Positive for shortness of breath. Negative for cough.   Cardiovascular: Positive for leg swelling. Negative for chest pain.  Gastrointestinal: Positive for abdominal distention, abdominal pain and constipation. Negative for diarrhea, nausea and vomiting (saturday before last saturday).  Genitourinary: Negative for  dysuria.  Musculoskeletal: Negative for myalgias.  Skin: Negative for rash.  Neurological: Negative for headaches.    Physical Exam Updated Vital Signs BP (!) 122/99 (BP Location: Left Arm)   Pulse (!) 106   Temp 97.6 F (36.4 C) (Oral)   Resp 18   Ht 5' (1.524 m)   Wt 133.9 kg   SpO2 97%   BMI 57.65 kg/m   Physical Exam Vitals and nursing note reviewed.  Constitutional:      General: She is not in acute distress.    Appearance: She is well-developed. She is not diaphoretic.  HENT:     Head: Normocephalic and  atraumatic.  Eyes:     Conjunctiva/sclera: Conjunctivae normal.  Cardiovascular:     Rate and Rhythm: Normal rate and regular rhythm.     Heart sounds: Normal heart sounds. No murmur heard. No friction rub. No gallop.   Pulmonary:     Effort: Pulmonary effort is normal. No respiratory distress.     Breath sounds: Normal breath sounds. No wheezing or rales.  Abdominal:     General: There is no distension.     Palpations: Abdomen is soft.     Tenderness: There is no abdominal tenderness. There is no guarding.  Musculoskeletal:        General: No tenderness.     Cervical back: Normal range of motion.  Skin:    General: Skin is warm and dry.     Findings: No erythema or rash.  Neurological:     Mental Status: She is alert and oriented to person, place, and time.     ED Results / Procedures / Treatments   Labs (all labs ordered are listed, but only abnormal results are displayed) Labs Reviewed  CBC WITH DIFFERENTIAL/PLATELET - Abnormal; Notable for the following components:      Result Value   WBC 12.5 (*)    RBC 5.51 (*)    MCH 25.0 (*)    RDW 17.2 (*)    Neutro Abs 8.6 (*)    All other components within normal limits  COMPREHENSIVE METABOLIC PANEL - Abnormal; Notable for the following components:   Sodium 134 (*)    CO2 21 (*)    Glucose, Bld 202 (*)    Calcium 8.8 (*)    Total Protein 5.9 (*)    Albumin 3.0 (*)    AST 49 (*)    ALT 57 (*)    All other components within normal limits  BRAIN NATRIURETIC PEPTIDE - Abnormal; Notable for the following components:   B Natriuretic Peptide 1,388.9 (*)    All other components within normal limits  HEMOGLOBIN A1C - Abnormal; Notable for the following components:   Hgb A1c MFr Bld 11.0 (*)    All other components within normal limits  TSH - Abnormal; Notable for the following components:   TSH 13.416 (*)    All other components within normal limits  CBC - Abnormal; Notable for the following components:   WBC 12.1 (*)     RBC 5.61 (*)    MCH 24.8 (*)    RDW 17.4 (*)    All other components within normal limits  COMPREHENSIVE METABOLIC PANEL - Abnormal; Notable for the following components:   Glucose, Bld 194 (*)    Calcium 8.8 (*)    Total Protein 6.0 (*)    Albumin 3.1 (*)    AST 46 (*)    ALT 52 (*)    All other components within  normal limits  LIPID PANEL - Abnormal; Notable for the following components:   Triglycerides 383 (*)    HDL 27 (*)    VLDL 77 (*)    All other components within normal limits  BASIC METABOLIC PANEL - Abnormal; Notable for the following components:   Sodium 131 (*)    CO2 20 (*)    Glucose, Bld 271 (*)    Calcium 8.2 (*)    All other components within normal limits  GLUCOSE, CAPILLARY - Abnormal; Notable for the following components:   Glucose-Capillary 218 (*)    All other components within normal limits  GLUCOSE, CAPILLARY - Abnormal; Notable for the following components:   Glucose-Capillary 178 (*)    All other components within normal limits  TROPONIN I (HIGH SENSITIVITY) - Abnormal; Notable for the following components:   Troponin I (High Sensitivity) 48 (*)    All other components within normal limits  TROPONIN I (HIGH SENSITIVITY) - Abnormal; Notable for the following components:   Troponin I (High Sensitivity) 50 (*)    All other components within normal limits  TROPONIN I (HIGH SENSITIVITY) - Abnormal; Notable for the following components:   Troponin I (High Sensitivity) 45 (*)    All other components within normal limits  TROPONIN I (HIGH SENSITIVITY) - Abnormal; Notable for the following components:   Troponin I (High Sensitivity) 55 (*)    All other components within normal limits  RESP PANEL BY RT-PCR (FLU A&B, COVID) ARPGX2  T4, FREE  HIV ANTIBODY (ROUTINE TESTING W REFLEX)  I-STAT BETA HCG BLOOD, ED ([facility], [facility], [facility] ONLY)    EKG EKG Interpretation  Date/Time:  Monday Feb 13 2021 10:41:34 EDT Ventricular Rate:  119 PR Interval:  177 QRS  Duration: 99 QT Interval:  376 QTC Calculation: 530 R Axis:   123 Text Interpretation: Sinus tachycardia Low voltage with right axis deviation Abnormal lateral Q waves Anteroseptal infarct, old Prolonged QT interval No significant change since last tracing Confirmed by Gareth Morgan [phone]) on 02/13/2021 1:44:45 PM   Radiology DG Chest Portable 1 View  Result Date: 02/13/2021 CLINICAL DATA:  SOB 3 weeks, comes and goes, no headaches, no vomiting, no lightheadedness, No cough, swelling in the upper abdomen. EXAM: PORTABLE CHEST 1 VIEW COMPARISON:  02/08/2021. FINDINGS: Mild enlargement of the cardiopericardial silhouette, stable. No mediastinal or hilar masses. Vascular congestion and interstitial thickening bilaterally. No lung consolidation. No visualized pleural effusion. No pneumothorax. Skeletal structures grossly intact. IMPRESSION: 1. Findings consistent with mild congestive heart failure with interstitial edema. Findings similar to the prior chest radiograph. Electronically Signed   By: Lajean Manes M.D.   On: 02/13/2021 12:12    Procedures Procedures   Medications Ordered in ED Medications  nitroGLYCERIN (NITROSTAT) SL tablet 0.4 mg (has no administration in time range)  enoxaparin (LOVENOX) injection 65 mg (65 mg Subcutaneous Given 02/13/21 1739)  acetaminophen (TYLENOL) tablet 650 mg (has no administration in time range)    Or  acetaminophen (TYLENOL) suppository 650 mg (has no administration in time range)  metoprolol tartrate (LOPRESSOR) tablet 12.5 mg (12.5 mg Oral Given 02/13/21 1950)  insulin aspart (novoLOG) injection 0-15 Units (3 Units Subcutaneous Given 02/14/21 0645)  furosemide (LASIX) injection 40 mg (40 mg Intravenous Given 02/13/21 1415)  aspirin chewable tablet 324 mg (324 mg Oral Given 02/13/21 1415)  furosemide (LASIX) injection 40 mg (40 mg Intravenous Given 02/13/21 1950)    ED Course  I have reviewed the triage vital signs and the nursing notes.  Pertinent  labs & imaging results that were available during my care of the patient were reviewed by me and considered in my medical decision making (see chart for details).    MDM Rules/Calculators/A&P                          45 year old female with history of depression, anxiety, ADHD, recent emergency department visits with concern for abdominal distention and discomfort with associated shortness of breath, as well as visit to urgent care for sore throat, for which she had work-up including a CT PE study, CT abdomen pelvis, delta troponins, and strep test which were all normal who presents with concern for continuing symptoms of abdominal distention, shortness of breath, and now swelling of her bilateral lower extremities and hands.    EKG shows sinus tachycardia without acute changes from previous.  Chest x-ray today shows findings consistent with mild congestive heart failure and interstitial edema TSH ordered and elevated at 13, however free T4 is 0.78.  Troponin today is elevated to 48, was previously for 5 days ago.  BNP is elevated to nearly 1400.  Findings consistent with State CHF.  Will admit for continued care.  Given aspirin, Lasix. Sent HgA1c given hyperglycemia.  Final Clinical Impression(s) / ED Diagnoses Final diagnoses:  Acute congestive heart failure, unspecified heart failure type [facility])    Rx / DC Orders ED Discharge Orders    None       Gareth Morgan, MD 02/14/21 [phone]

## 2021-02-13 NOTE — H&P (Addendum)
[facility] Admission History and Physical Service Pager: [phone]  Patient name: Vickie Graham Medical record number: [id number] Date of birth: Apr 18, 1976 Age: 45 y.o. Gender: female  Primary Care Provider: Pcp, No Consultants: None Code Status: Full  Preferred Emergency Contact: Ashok Pall   Chief Complaint: shortness of breath, increased abdominal pressure   Assessment and Plan: Vickie Graham is a 45 y.o. female presenting with dyspnea . PMH is significant for obesity.   New onset HFrEF Presented with SOB x 3 weeks, worsened with exertion, LE edema, and increased upper abdominal pressure. On presentation patient tachycardic at 120 bpm. No abdominal tenderness on exam. Labs significant for BNP 1388.9, troponin mildly elevated  48>50, TSH 13.415 with normal free T4 of 0.78. WBC 12.5. Albumin 3.0, AST 49, ALT 57. EKG with sinus tachycardia with possible old anteroseptal infarct. CXR consistent with mild congestive heart failure with interstitial edema. S/p IV lasix 40mg . Symptoms of dyspnea, orthopnea, edema are likely due to new onset heart failure. Upper abdominal pressure likely from increased fluid causing pain. POCUS performed by Fellow and concerning for LVEF<30%.  Also on the differential is PE given her dyspnea and tachycardia. Wells score of 1.5 indicating 1.3% low risk of PE. Considering a potential previous MI given EKG findings and prior symptoms of bilateral arm pain, upper abdominal discomfort, and dyspnea. Of note, patient with previous sinusitis, consider post viral myositis as a potential cause for new HFrEF. Admitting patient for further cardiac workup.  - admit to FPTS cardiac telemetry, attending Dr. Owens Shark - cardiology consult in am - vitals per floor - cardiac monitoring - f/u ECHO - trend troponins  - incentive spirometry  - strict Is and Os - daily weights - am CMP, CBC - Tylenol 650mg  q 6 prn  - start Metoprolol 12.5mg   - lasix  40mg  once - repeat bmp, mag - PT/OT  Hyperglycemia, no known diabetes  Fasting glucose on admission 202, last ate 5/29. - A1c - SSI  - CBG before meals and at bedtime   Hypothyroid Reports no history of Thyroid disorder.  TSH 13.415, FT4 0.78.  Reports intermittent heart palpitations.  Tachycardic on exam without chest pain. --Consider starting Synthroid 25 mcg daily --Repeat TSH 6 weeks outpatient.  Obesity/ Eating Disorder BMI 58.59. States she was doing weight watchers to her symptom onset. Reports that she doesn't weigh herself often given her eating disorder.  -outpatient weight loss resources at discharge   Social Endorses increased stress lately Has not gone to therapy but interested in it.  - TOC consult for resources at discharge   FEN/GI: Heart healthy diet  Prophylaxis: Lovenox   Disposition: cardiac telemetry   History of Present Illness:  Vickie Graham is a 45 y.o. female presenting with shortness of breath and icnreased abdominal pressure.   Reports SOBx3 weeks worsened with exertion and swelling of hands and feet bilaterally. States overall feels abdomen increased pressure with dificulty breathing for about 6 months. In the past would eat more fruits and vegetables and it would go away so thought it was constipation   About 2 weeks ago she had bilateral arm aching and an hour after had N/V x 2.   Came to ED on 5/25 and thought SOB was because constipated. PE, CT abdomen negative. Was given GI cocktail.Thought it was gall bladder- gave her information regarding removal.   Urgent care Thursday, 5/26 for sore throat and congestion. Was given antacid. Since then still feeling SOB and  increased swelling in legs and arms and more DOE. Denies CP but says bandlike pressure around top of abdomen. Denies pain on inhalation. Endorses small headache because she hasnt eaten, denies recent infection, cough, denies issues with urinating, diarrhea, constipation.    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 Denies any other medications   Review Of Systems: Per HPI   Review of Systems   Patient Active Problem List   Diagnosis Date Noted  . Severe obesity (BMI >= 40) ([place]) 12/13/2013    Past Medical History: Past Medical History:  Diagnosis Date  . ADHD (attention deficit hyperactivity disorder)    diagnosed at [facility].  Marland Kitchen Allergy   . Anxiety    Social anxiety, generalized anxiety disorder.  . Depression     Past Surgical History: History reviewed. No pertinent surgical history.  Social History: Social History   Tobacco Use  . Smoking status: Former Smoker    Quit date: 01/12/2010    Years since quitting: 11.0  . Smokeless tobacco: Never Used  Vaping Use  . Vaping Use: Never used  Substance Use Topics  . Alcohol use: No  . Drug use: No    Family History: History reviewed. No pertinent family history.  Allergies and Medications: No Known Allergies No current facility-administered medications on file prior to encounter.   Current Outpatient Medications on File Prior to Encounter  Medication Sig Dispense Refill  . hydrocortisone (ANUSOL-HC) 2.5 % rectal cream Apply rectally 2 times daily 28.35 g 0  . pantoprazole (PROTONIX) 40 MG tablet Take 1 tablet (40 mg total) by mouth daily. 30 tablet 0  . sucralfate (CARAFATE) 1 g tablet Take 1 tablet (1 g total) by mouth 4 (four) times daily -  with meals and at bedtime. Dissolve 1 tablet into a glass of water and drink 120 tablet 0    Objective: BP 113/87   Pulse (!) 104   Temp (!) 97.5 F (36.4 C) (Oral)   Resp (!) 22   Ht 5' (1.524 m)   Wt 136.1 kg   SpO2 94%   BMI 58.59 kg/m   Physical Exam Constitutional:      General: She is not in acute distress.    Appearance: She is obese.  HENT:     Head:  Normocephalic and atraumatic.  Eyes:     Extraocular Movements: Extraocular movements intact.     Pupils: Pupils are equal, round, and reactive to light.  Cardiovascular:     Rate and Rhythm: Regular rhythm. Tachycardia present.     Heart sounds: No murmur heard.   Pulmonary:     Effort: Pulmonary effort is normal.     Breath sounds: Examination of the right-lower field reveals rhonchi. Examination of the left-lower field reveals rhonchi. Rhonchi present.  Abdominal:     Palpations: Abdomen is soft.     Tenderness: There is no abdominal tenderness.  Musculoskeletal:        General: Normal range of motion.     Cervical back: Normal range of motion and neck supple.     Right lower leg: Edema present.     Left lower leg: Edema present.  Skin:    General: Skin is warm and dry.  Neurological:     General: No focal deficit present.     Mental Status:  She is alert and oriented to person, place, and time.  Psychiatric:        Mood and Affect: Mood normal.        Behavior: Behavior normal.     Labs and Imaging: CBC BMET  Recent Labs  Lab 02/13/21 1200  WBC 12.5*  HGB 13.8  HCT 45.4  PLT 283   Recent Labs  Lab 02/13/21 1200  NA 134*  K 3.9  CL 103  CO2 21*  BUN 14  CREATININE 0.83  GLUCOSE 202*  CALCIUM 8.8*     EKG: sinus tachycardia rate of 119 bpm and possible old anteroseptal infarct   DG Chest Portable 1 View  Result Date: 02/13/2021 CLINICAL DATA:  SOB 3 weeks, comes and goes, no headaches, no vomiting, no lightheadedness, No cough, swelling in the upper abdomen. EXAM: PORTABLE CHEST 1 VIEW COMPARISON:  02/08/2021. FINDINGS: Mild enlargement of the cardiopericardial silhouette, stable. No mediastinal or hilar masses. Vascular congestion and interstitial thickening bilaterally. No lung consolidation. No visualized pleural effusion. No pneumothorax. Skeletal structures grossly intact. IMPRESSION: 1. Findings consistent with mild congestive heart failure with  interstitial edema. Findings similar to the prior chest radiograph. Electronically Signed   By: Lajean Manes M.D.   On: 02/13/2021 12:12    Shary Key, DO 02/13/2021, 2:00 PM PGY-1, [place] [facility] pager: [phone], text pages welcome  FPTS Upper-Level Resident Addendum   I have independently interviewed and examined the patient. I have discussed the above with the original author and agree with their documentation. Please see also any attending notes.    Carollee Leitz MD PGY-2, [facility] 02/14/2021 7:29 AM  FPTS Service pager: [phone] (text pages welcome through [facility])

## 2021-02-13 NOTE — Plan of Care (Signed)

## 2021-02-13 NOTE — ED Triage Notes (Signed)
Pt here from home with complaints of shob x1 week that worsens with exertion. Pt also complains of swelling in bil feet and hands. Denies pain at this time.

## 2021-02-13 NOTE — ED Notes (Signed)
Pt attempted to sign MSE but the signature pad in room is not working

## 2021-02-14 ENCOUNTER — Other Ambulatory Visit (HOSPITAL_COMMUNITY): Payer: Self-pay

## 2021-02-14 ENCOUNTER — Inpatient Hospital Stay: Payer: Self-pay

## 2021-02-14 ENCOUNTER — Observation Stay (HOSPITAL_COMMUNITY): Payer: Medicaid Other

## 2021-02-14 DIAGNOSIS — I509 Heart failure, unspecified: Secondary | ICD-10-CM | POA: Diagnosis not present

## 2021-02-14 DIAGNOSIS — I428 Other cardiomyopathies: Secondary | ICD-10-CM | POA: Diagnosis not present

## 2021-02-14 DIAGNOSIS — E785 Hyperlipidemia, unspecified: Secondary | ICD-10-CM | POA: Diagnosis present

## 2021-02-14 DIAGNOSIS — E039 Hypothyroidism, unspecified: Secondary | ICD-10-CM | POA: Diagnosis not present

## 2021-02-14 DIAGNOSIS — I429 Cardiomyopathy, unspecified: Secondary | ICD-10-CM | POA: Diagnosis not present

## 2021-02-14 DIAGNOSIS — Z882 Allergy status to sulfonamides status: Secondary | ICD-10-CM | POA: Diagnosis not present

## 2021-02-14 DIAGNOSIS — I5021 Acute systolic (congestive) heart failure: Secondary | ICD-10-CM | POA: Diagnosis not present

## 2021-02-14 DIAGNOSIS — R0602 Shortness of breath: Secondary | ICD-10-CM | POA: Diagnosis present

## 2021-02-14 DIAGNOSIS — R079 Chest pain, unspecified: Secondary | ICD-10-CM

## 2021-02-14 DIAGNOSIS — Z20822 Contact with and (suspected) exposure to covid-19: Secondary | ICD-10-CM | POA: Diagnosis not present

## 2021-02-14 DIAGNOSIS — K219 Gastro-esophageal reflux disease without esophagitis: Secondary | ICD-10-CM | POA: Diagnosis present

## 2021-02-14 DIAGNOSIS — R109 Unspecified abdominal pain: Secondary | ICD-10-CM | POA: Diagnosis not present

## 2021-02-14 DIAGNOSIS — I471 Supraventricular tachycardia: Secondary | ICD-10-CM | POA: Diagnosis not present

## 2021-02-14 DIAGNOSIS — Z6841 Body Mass Index (BMI) 40.0 and over, adult: Secondary | ICD-10-CM | POA: Diagnosis not present

## 2021-02-14 DIAGNOSIS — E781 Pure hyperglyceridemia: Secondary | ICD-10-CM | POA: Diagnosis present

## 2021-02-14 DIAGNOSIS — Z87891 Personal history of nicotine dependence: Secondary | ICD-10-CM | POA: Diagnosis not present

## 2021-02-14 DIAGNOSIS — E876 Hypokalemia: Secondary | ICD-10-CM | POA: Diagnosis not present

## 2021-02-14 DIAGNOSIS — F509 Eating disorder, unspecified: Secondary | ICD-10-CM | POA: Diagnosis not present

## 2021-02-14 DIAGNOSIS — I272 Pulmonary hypertension, unspecified: Secondary | ICD-10-CM | POA: Diagnosis not present

## 2021-02-14 DIAGNOSIS — E1165 Type 2 diabetes mellitus with hyperglycemia: Secondary | ICD-10-CM | POA: Diagnosis not present

## 2021-02-14 DIAGNOSIS — N179 Acute kidney failure, unspecified: Secondary | ICD-10-CM | POA: Diagnosis not present

## 2021-02-14 DIAGNOSIS — F909 Attention-deficit hyperactivity disorder, unspecified type: Secondary | ICD-10-CM | POA: Diagnosis not present

## 2021-02-14 DIAGNOSIS — I5023 Acute on chronic systolic (congestive) heart failure: Secondary | ICD-10-CM | POA: Diagnosis not present

## 2021-02-14 LAB — ECHOCARDIOGRAM COMPLETE
AR max vel: 1.32 cm2
AV Area VTI: 1.37 cm2
AV Area mean vel: 1.33 cm2
AV Mean grad: 5 mmHg
AV Peak grad: 8.2 mmHg
Ao pk vel: 1.43 m/s
Height: 60 in
S' Lateral: 4.5 cm
Weight: 4723.2 oz

## 2021-02-14 LAB — COMPREHENSIVE METABOLIC PANEL
ALT: 52 U/L — ABNORMAL HIGH (ref 0–44)
AST: 46 U/L — ABNORMAL HIGH (ref 15–41)
Albumin: 3.1 g/dL — ABNORMAL LOW (ref 3.5–5.0)
Alkaline Phosphatase: 88 U/L (ref 38–126)
Anion gap: 14 (ref 5–15)
BUN: 14 mg/dL (ref 6–20)
CO2: 24 mmol/L (ref 22–32)
Calcium: 8.8 mg/dL — ABNORMAL LOW (ref 8.9–10.3)
Chloride: 98 mmol/L (ref 98–111)
Creatinine, Ser: 0.95 mg/dL (ref 0.44–1.00)
GFR, Estimated: >60 mL/min (ref >60)
Glucose, Bld: 194 mg/dL — ABNORMAL HIGH (ref 70–99)
Potassium: 3.6 mmol/L (ref 3.5–5.1)
Sodium: 136 mmol/L (ref 135–145)
Total Bilirubin: 0.9 mg/dL (ref 0.3–1.2)
Total Protein: 6 g/dL — ABNORMAL LOW (ref 6.5–8.1)

## 2021-02-14 LAB — GLUCOSE, CAPILLARY
Glucose-Capillary: 178 mg/dL — ABNORMAL HIGH (ref 70–99)
Glucose-Capillary: 252 mg/dL — ABNORMAL HIGH (ref 70–99)
Glucose-Capillary: 209 mg/dL — ABNORMAL HIGH (ref 70–99)
Glucose-Capillary: 212 mg/dL — ABNORMAL HIGH (ref 70–99)

## 2021-02-14 LAB — CBC
HCT: 45.7 % (ref 36.0–46.0)
Hemoglobin: 13.9 g/dL (ref 12.0–15.0)
MCH: 24.8 pg — ABNORMAL LOW (ref 26.0–34.0)
MCHC: 30.4 g/dL (ref 30.0–36.0)
MCV: 81.5 fL (ref 80.0–100.0)
Platelets: 260 10*3/uL (ref 150–400)
RBC: 5.61 MIL/uL — ABNORMAL HIGH (ref 3.87–5.11)
RDW: 17.4 % — ABNORMAL HIGH (ref 11.5–15.5)
WBC: 12.1 10*3/uL — ABNORMAL HIGH (ref 4.0–10.5)
nRBC: 0 % (ref 0.0–0.2)

## 2021-02-14 LAB — LIPID PANEL
Cholesterol: 156 mg/dL (ref 0–200)
HDL: 27 mg/dL — ABNORMAL LOW (ref >40)
LDL Cholesterol: 52 mg/dL (ref 0–99)
Total CHOL/HDL Ratio: 5.8 RATIO
Triglycerides: 383 mg/dL — ABNORMAL HIGH (ref <150)
VLDL: 77 mg/dL — ABNORMAL HIGH (ref 0–40)

## 2021-02-14 LAB — HEMOGLOBIN A1C
Hgb A1c MFr Bld: 11 % — ABNORMAL HIGH (ref 4.8–5.6)
Mean Plasma Glucose: 269 mg/dL

## 2021-02-14 MED ORDER — SODIUM CHLORIDE 0.9% FLUSH: 10.0000 mL | INTRAVENOUS | Status: DC | PRN

## 2021-02-14 MED ORDER — EMPAGLIFLOZIN 10 MG PO TABS
10.0000 mg | ORAL_TABLET | Freq: Every day | ORAL | Status: DC
  Administered 2021-02-14 – 2021-02-20 (×7): 10 mg via ORAL
  Filled 2021-02-14 (×7): qty 1

## 2021-02-14 MED ORDER — FUROSEMIDE 10 MG/ML IJ SOLN
40.0000 mg | Freq: Two times a day (BID) | INTRAMUSCULAR | Status: DC
  Administered 2021-02-14 – 2021-02-15 (×2): 40 mg via INTRAVENOUS
  Filled 2021-02-14 (×2): qty 4

## 2021-02-14 MED ORDER — SPIRONOLACTONE 12.5 MG HALF TABLET
12.5000 mg | ORAL_TABLET | Freq: Every day | ORAL | Status: DC
  Administered 2021-02-14 – 2021-02-15 (×2): 12.5 mg via ORAL
  Filled 2021-02-14 (×2): qty 1

## 2021-02-14 MED ORDER — LOSARTAN POTASSIUM 25 MG PO TABS
25.0000 mg | ORAL_TABLET | Freq: Every day | ORAL | Status: DC
  Administered 2021-02-14 – 2021-02-15 (×2): 25 mg via ORAL
  Filled 2021-02-14 (×2): qty 1

## 2021-02-14 MED ORDER — LOSARTAN POTASSIUM 25 MG PO TABS: 12.5000 mg | ORAL_TABLET | Freq: Every day | ORAL | Status: DC

## 2021-02-14 MED ORDER — LEVOTHYROXINE SODIUM 25 MCG PO TABS
25.0000 ug | ORAL_TABLET | Freq: Every day | ORAL | Status: DC
  Administered 2021-02-14 – 2021-02-16 (×3): 25 ug via ORAL
  Filled 2021-02-14 (×3): qty 1

## 2021-02-14 MED ORDER — SODIUM CHLORIDE 0.9% FLUSH
10.0000 mL | Freq: Two times a day (BID) | INTRAVENOUS | Status: DC
  Administered 2021-02-14: 40 mL
  Administered 2021-02-15: 30 mL
  Administered 2021-02-15 – 2021-02-20 (×7): 10 mL

## 2021-02-14 MED ORDER — METOPROLOL SUCCINATE ER 25 MG PO TB24
12.5000 mg | ORAL_TABLET | Freq: Every day | ORAL | Status: DC
  Administered 2021-02-14 – 2021-02-18 (×4): 12.5 mg via ORAL
  Filled 2021-02-14 (×5): qty 1

## 2021-02-14 MED ORDER — CHLORHEXIDINE GLUCONATE CLOTH 2 % EX PADS
6.0000 | MEDICATED_PAD | Freq: Every day | CUTANEOUS | Status: DC
  Administered 2021-02-15 – 2021-02-20 (×7): 6 via TOPICAL

## 2021-02-14 MED ORDER — ROSUVASTATIN CALCIUM 20 MG PO TABS
20.0000 mg | ORAL_TABLET | Freq: Every day | ORAL | Status: DC
  Administered 2021-02-14 – 2021-02-20 (×7): 20 mg via ORAL
  Filled 2021-02-14 (×7): qty 1

## 2021-02-14 MED ORDER — INSULIN GLARGINE 100 UNIT/ML ~~LOC~~ SOLN
5.0000 [IU] | Freq: Every day | SUBCUTANEOUS | Status: DC
  Filled 2021-02-14: qty 0.05

## 2021-02-14 MED ORDER — FUROSEMIDE 10 MG/ML IJ SOLN
40.0000 mg | Freq: Once | INTRAMUSCULAR | Status: AC
  Administered 2021-02-14: 40 mg via INTRAVENOUS
  Filled 2021-02-14: qty 4

## 2021-02-14 MED ORDER — PERFLUTREN LIPID MICROSPHERE
1.0000 mL | INTRAVENOUS | Status: AC | PRN
  Filled 2021-02-14: qty 10

## 2021-02-14 NOTE — Progress Notes (Signed)
FPTS Interim Progress Note  S:  Patient has no current comlaints.  Informed her Cardiology planning on seeing tomorrow.  O: BP (!) 115/99 (BP Location: Left Arm)   Pulse 91   Temp 97.7 F (36.5 C) (Oral)   Resp 16   Ht 5' (1.524 m)   Wt 133.9 kg   SpO2 96%   BMI 57.65 kg/m    Physical Exam Constitutional:      General: She is not in acute distress. HENT:     Head: Normocephalic and atraumatic.  Cardiovascular:     Rate and Rhythm: Normal rate and regular rhythm.  Pulmonary:     Breath sounds: No decreased breath sounds, wheezing or rales.  Neurological:     Mental Status: She is alert.     A/P: See H&P.  No further additions.  Delora Fuel, MD 02/14/2021, 4:42 AM PGY-1, [place] [facility] Service pager [phone]

## 2021-02-14 NOTE — Consult Note (Addendum)
Advanced Heart Failure Team Consult Note   Primary Physician: Pcp, No PCP-Cardiologist:  None  Reason for Consultation: New acute systolic heart failure   HPI:    Vickie Graham is seen today for evaluation of new acute systolic heart failure at the request of Dr. Owens Shark, Internal Medicine.   45 y/o female w/ h/o obesity, ADHD, anxiety, depression, hypothyroidism, GERD and PACs. Presented to ED on 5/30 w/ complaints of 3 week h/o progressive DOE, LEE and abdominal fullness. Found to be in acute CHF. BNP 1388. CXR w/ mild congestive CHF w/ interstitial edema. EKG showed sinus tach 120 bpm. HS trop 48>>50.  TSH elevated at 13.416. Free T4 WNL at 0.78. WBC 12.5, AST 49, ALT 57. SCr 0.83. K 3.9. Na 134. Chest CT negative for PE.   She was admitted for CHF and started on IV Lasix. Echo completed today showing biventricular dysfunction. LVEF severely reduced 20-25% w/ global HK. Mild LVH. RV moderately reduced w/ estimated RVSP 36.1 mmHg. She is also found to have new diagnosis of T2DM. Hyperglycemic on arrival. Hgb A1c 11. SBPs not markedly high, range 110s-140s. Most recent SBP soft at 104.   She has received several doses of 40 mg IV Lasix and placed on Jardiance. On metoprolol tartrate 12.5 for HR. She has had good response to IV Lasix w/ -4.7L in UOP yesterday. I/Os net negative 3.7L. Wt down 5 lb. Feeling better but still fluid overloaded. SCr stable w/ diuresis.    She denies h/o ischemic CP. No family history of CHF or CAD. Denies any recent viral illnesses. She is vaccinated for COVID and boosted. Denies ETOH. No tobacco. Believes she snores. No prior sleep evaluation.     Echo 02/14/21 1. Left ventricular ejection fraction, by estimation, is 20 to 25%. The left ventricle has severely decreased function. The left ventricle demonstrates global hypokinesis. The left ventricular internal cavity size was moderately dilated. There is mild left ventricular hypertrophy. Left ventricular  diastolic parameters are indeterminate. 2. Right ventricular systolic function is moderately reduced. The right ventricular size is normal. There is mildly elevated pulmonary artery systolic pressure. The estimated right ventricular systolic pressure is 67.3 mmHg. 3. Left atrial size was mildly dilated. 4. The mitral valve is normal in structure. Trivial mitral valve regurgitation. 5. The aortic valve is tricuspid. Aortic valve regurgitation is not visualized. No aortic stenosis is present. 6. The inferior vena cava is dilated in size with >50% respiratory variability, suggesting right atrial pressure of 8 mmHg.  Review of Systems: [y] = yes, [ ]  = no   . General: Weight gain [ ] ; Weight loss [ ] ; Anorexia [ ] ; Fatigue [ ] ; Fever [ ] ; Chills [ ] ; Weakness [ ]   . Cardiac: Chest pain/pressure [ ] ; Resting SOB [ ] ; Exertional SOB [Y ]; Orthopnea [ ] ; Pedal Edema [Y ]; Palpitations [ ] ; Syncope [ ] ; Presyncope [ ] ; Paroxysmal nocturnal dyspnea[ ]   . Pulmonary: Cough [ ] ; Wheezing[ ] ; Hemoptysis[ ] ; Sputum [ ] ; Snoring [ Y]  . GI: Vomiting[ ] ; Dysphagia[ ] ; Melena[ ] ; Hematochezia [ ] ; Heartburn[ ] ; Abdominal pain [ ] ; Constipation [ ] ; Diarrhea [ ] ; BRBPR [ ]   . GU: Hematuria[ ] ; Dysuria [ ] ; Nocturia[ ]   . Vascular: Pain in legs with walking [ ] ; Pain in feet with lying flat [ ] ; Non-healing sores [ ] ; Stroke [ ] ; TIA [ ] ; Slurred speech [ ] ;  . Neuro: Headaches[ ] ; Vertigo[ ] ; Seizures[ ] ; Paresthesias[ ] ;Blurred  vision [ ] ; Diplopia [ ] ; Vision changes [ ]   . Ortho/Skin: Arthritis [ ] ; Joint pain [ ] ; Muscle pain [ ] ; Joint swelling [ ] ; Back Pain [ ] ; Rash [ ]   . Psych: Depression[Y ]; Anxiety[Y ]  . Heme: Bleeding problems [ ] ; Clotting disorders [ ] ; Anemia [ ]   . Endocrine: Diabetes [Y ]; Thyroid dysfunction[ Y]  Home Medications Prior to Admission medications   Medication Sig Start Date End Date Taking? Authorizing Provider  pantoprazole (PROTONIX) 40 MG tablet Take 1 tablet (40 mg  total) by mouth daily. 02/08/21  Yes Truddie Hidden, MD  sucralfate (CARAFATE) 1 g tablet Take 1 tablet (1 g total) by mouth 4 (four) times daily -  with meals and at bedtime. Dissolve 1 tablet into a glass of water and drink 02/09/21  Yes Volney American, PA-C    Past Medical History: Past Medical History:  Diagnosis Date  . ADHD (attention deficit hyperactivity disorder)    diagnosed at [facility].  Marland Kitchen Allergy   . Anxiety    Social anxiety, generalized anxiety disorder.  . Depression     Past Surgical History: History reviewed. No pertinent surgical history.  Family History: History reviewed. No pertinent family history.  Social History: Social History   Socioeconomic History  . Marital status: Divorced    Spouse name: Not on file  . Number of children: Not on file  . Years of education: Not on file  . Highest education level: Not on file  Occupational History  . Not on file  Tobacco Use  . Smoking status: Former Smoker    Quit date: 01/12/2010    Years since quitting: 11.0  . Smokeless tobacco: Never Used  Vaping Use  . Vaping Use: Never used  Substance and Sexual Activity  . Alcohol use: No  . Drug use: No  . Sexual activity: Not on file  Other Topics Concern  . Not on file  Social History Narrative   Marital status: divorced and engaged since 2011.     Children: 2 children      Lives with fiance and two children (35, 10)      Employment: childcare early development; 3s and 4s. Surveyor, quantity      Tobacco: none      Alcohol: none      Drugs; None      Exercise: sporadic.           Social Determinants of Health   Financial Resource Strain: Not on file  Food Insecurity: Not on file  Transportation Needs: Not on file  Physical Activity: Not on file  Stress: Not on file  Social Connections: Not on file    Allergies:  Allergies  Allergen Reactions  . Sulfa Antibiotics     Objective:    Vital Signs:   Temp:  [97.6 F (36.4  C)-98.1 F (36.7 C)] 97.6 F (36.4 C) (05/31 1121) Pulse Rate:  [91-132] 103 (05/31 1121) Resp:  [14-19] 18 (05/31 1121) BP: (104-147)/(67-108) 104/67 (05/31 1121) SpO2:  [95 %-98 %] 97 % (05/31 1121) Weight:  [133.9 kg] 133.9 kg (05/31 0415) Last BM Date: 02/14/21  Weight change: Filed Weights   02/13/21 1036 02/14/21 0415  Weight: 136.1 kg 133.9 kg    Intake/Output:   Intake/Output Summary (Last 24 hours) at 02/14/2021 1441 Last data filed at 02/14/2021 1300 Gross per 24 hour  Intake 1200 ml  Output 4850 ml  Net -3650 ml  Physical Exam    General:  Well appearing, morbidly obese . No resp difficulty HEENT: normal Neck: supple. JVP elevated to jaw . Carotids 2+ bilat; no bruits. No lymphadenopathy or thyromegaly appreciated. Cor: PMI nondisplaced. Regular rhythm, mildly tachy rate . No rubs, gallops or murmurs. Lungs: clear Abdomen: obese/ mildly distended, soft, nontender No hepatosplenomegaly. No bruits or masses. Good bowel sounds. Extremities: no cyanosis, clubbing, rash, trace bilateral LE edema Neuro: alert & orientedx3, cranial nerves grossly intact. moves all 4 extremities w/o difficulty. Affect pleasant   Telemetry   Sinus tach 110s   EKG    Sinus tach, 119 bpm. Low voltage   Labs   Basic Metabolic Panel: Recent Labs  Lab 02/08/21 1314 02/13/21 1200 02/13/21 1931 02/14/21 0337  NA 135 134* 131* 136  K 4.3 3.9 3.8 3.6  CL 99 103 98 98  CO2 23 21* 20* 24  GLUCOSE 243* 202* 271* 194*  BUN 14 14 14 14   CREATININE 0.85 0.83 0.99 0.95  CALCIUM 9.0 8.8* 8.2* 8.8*    Liver Function Tests: Recent Labs  Lab 02/08/21 1314 02/13/21 1200 02/14/21 0337  AST 67* 49* 46*  ALT 73* 57* 52*  ALKPHOS 135* 89 88  BILITOT 0.8 0.9 0.9  PROT 6.8 5.9* 6.0*  ALBUMIN 3.8 3.0* 3.1*   Recent Labs  Lab 02/08/21 1314  LIPASE 22   No results for input(s): AMMONIA in the last 168 hours.  CBC: Recent Labs  Lab 02/08/21 1314 02/13/21 1200  02/14/21 0337  WBC 15.8* 12.5* 12.1*  NEUTROABS 8.8* 8.6*  --   HGB 14.8 13.8 13.9  HCT 48.6* 45.4 45.7  MCV 82.7 82.4 81.5  PLT 307 283 260    Cardiac Enzymes: No results for input(s): CKTOTAL, CKMB, CKMBINDEX, TROPONINI in the last 168 hours.  BNP: BNP (last 3 results) Recent Labs    02/13/21 1217  BNP 1,388.9*    ProBNP (last 3 results) No results for input(s): PROBNP in the last 8760 hours.   CBG: Recent Labs  Lab 02/13/21 2254 02/14/21 0615 02/14/21 1119  GLUCAP 218* 178* 252*    Coagulation Studies: No results for input(s): LABPROT, INR in the last 72 hours.   Imaging   ECHOCARDIOGRAM COMPLETE  Result Date: 02/14/2021    ECHOCARDIOGRAM REPORT   Patient Name:   Vickie Graham Date of Exam: 02/14/2021 Medical Rec #:  [id number]     Height:       60.0 in Accession #:    [id number]    Weight:       295.2 lb Date of Birth:  Jul 05, 1976      BSA:          2.202 m Patient Age:    66 years      BP:           122/79 mmHg Patient Gender: F             HR:           106 bpm. Exam Location:  Inpatient Procedure: 2D Echo, Cardiac Doppler, Color Doppler and Intracardiac            Opacification Agent  Results communicated to Dr Arby Barrette at 11:58am on 02/14/21. Indications:    R07.9* Chest pain, unspecified  History:        Patient has no prior history of Echocardiogram examinations.                 CHF, Signs/Symptoms:Shortness of Breath; Risk Factors:Morbid  obesity. Edema.  Sonographer:    Merrie Roof RDCS Referring Phys: [id number]  1. Left ventricular ejection fraction, by estimation, is 20 to 25%. The left ventricle has severely decreased function. The left ventricle demonstrates global hypokinesis. The left ventricular internal cavity size was moderately dilated. There is mild left ventricular hypertrophy. Left ventricular diastolic parameters are indeterminate.  2. Right ventricular systolic function is moderately reduced. The right ventricular  size is normal. There is mildly elevated pulmonary artery systolic pressure. The estimated right ventricular systolic pressure is 24.2 mmHg.  3. Left atrial size was mildly dilated.  4. The mitral valve is normal in structure. Trivial mitral valve regurgitation.  5. The aortic valve is tricuspid. Aortic valve regurgitation is not visualized. No aortic stenosis is present.  6. The inferior vena cava is dilated in size with >50% respiratory variability, suggesting right atrial pressure of 8 mmHg. FINDINGS  Left Ventricle: Left ventricular ejection fraction, by estimation, is 20 to 25%. The left ventricle has severely decreased function. The left ventricle demonstrates global hypokinesis. Definity contrast agent was given IV to delineate the left ventricular endocardial borders. The left ventricular internal cavity size was moderately dilated. There is mild left ventricular hypertrophy. Left ventricular diastolic parameters are indeterminate. Right Ventricle: The right ventricular size is normal. Right vetricular wall thickness was not well visualized. Right ventricular systolic function is moderately reduced. There is mildly elevated pulmonary artery systolic pressure. The tricuspid regurgitant velocity is 2.65 m/s, and with an assumed right atrial pressure of 8 mmHg, the estimated right ventricular systolic pressure is 68.3 mmHg. Left Atrium: Left atrial size was mildly dilated. Right Atrium: Right atrial size was normal in size. Pericardium: There is no evidence of pericardial effusion. Mitral Valve: The mitral valve is normal in structure. Trivial mitral valve regurgitation. Tricuspid Valve: The tricuspid valve is normal in structure. Tricuspid valve regurgitation is trivial. Aortic Valve: The aortic valve is tricuspid. Aortic valve regurgitation is not visualized. No aortic stenosis is present. Aortic valve mean gradient measures 5.0 mmHg. Aortic valve peak gradient measures 8.2 mmHg. Aortic valve area, by VTI  measures 1.37 cm. Pulmonic Valve: The pulmonic valve was not well visualized. Pulmonic valve regurgitation is not visualized. Aorta: The aortic root and ascending aorta are structurally normal, with no evidence of dilitation. Venous: The inferior vena cava is dilated in size with greater than 50% respiratory variability, suggesting right atrial pressure of 8 mmHg. IAS/Shunts: The interatrial septum was not well visualized.  LEFT VENTRICLE PLAX 2D LVIDd:         6.10 cm LVIDs:         4.50 cm LV PW:         1.00 cm LV IVS:        0.90 cm LVOT diam:     2.00 cm LV SV:         30 LV SV Index:   13 LVOT Area:     3.14 cm  RIGHT VENTRICLE            IVC RV Basal diam:  3.80 cm    IVC diam: 2.10 cm RV S prime:     9.90 cm/s TAPSE (M-mode): 2.1 cm LEFT ATRIUM             Index       RIGHT ATRIUM           Index LA diam:        4.50 cm 2.04 cm/m  RA Area:  17.60 cm LA Vol (A2C):   82.2 ml 37.32 ml/m RA Volume:   47.70 ml  21.66 ml/m LA Vol (A4C):   90.4 ml 41.04 ml/m LA Biplane Vol: 89.8 ml 40.77 ml/m  AORTIC VALVE AV Area (Vmax):    1.32 cm AV Area (Vmean):   1.33 cm AV Area (VTI):     1.37 cm AV Vmax:           143.00 cm/s AV Vmean:          107.000 cm/s AV VTI:            0.215 m AV Peak Grad:      8.2 mmHg AV Mean Grad:      5.0 mmHg LVOT Vmax:         60.20 cm/s LVOT Vmean:        45.400 cm/s LVOT VTI:          0.094 m LVOT/AV VTI ratio: 0.44  AORTA Ao Root diam: 2.60 cm Ao Asc diam:  2.70 cm TRICUSPID VALVE TV Peak grad:   26.6 mmHg TV Vmax:        2.58 m/s TR Peak grad:   28.1 mmHg TR Vmax:        265.00 cm/s  SHUNTS Systemic VTI:  0.09 m Systemic Diam: 2.00 cm Oswaldo Milian MD Electronically signed by Oswaldo Milian MD Signature Date/Time: 02/14/2021/11:58:52 AM    Final       Medications:     Current Medications: . empagliflozin  10 mg Oral Daily  . enoxaparin (LOVENOX) injection  65 mg Subcutaneous Q24H  . insulin aspart  0-15 Units Subcutaneous TID WC  . levothyroxine  25 mcg  Oral Q0600  . metoprolol tartrate  12.5 mg Oral Q2000  . rosuvastatin  20 mg Oral Daily     Infusions:    Assessment/Plan   1. New Systolic Heart Failure  - recent 3 week h/o progressive DOE and peripheral edema. BNP 1300. CXR c/w CHF - Echo: LVEF 20-25% w/ diffuse HK, mild LVH. RV moderately reduced, estimated RVSP 36 mmgh  - HS trop flat and low level not c/w ACS, however will need R/LHC to exclude CAD (likely Thursday)  - TSH WNL. HIV NR. BP not markedly high. Respiratory panel negative. No FH of CHF. No ETOH  - Ideally, would plan cMRI if LHC unrevealing, however BMI/obesity may prohibit  - will need outpatient sleep stud eval to r/o OSA  - she is responding well to IV Lasix but still appears fluid overloaded on exam, although somewhat limited due to body habitus - Will place PICC line to set up CVP monitoring to help w/ volume assessment and guide diuresis  - Continue IV Lasix 40 mg daily for now  - Initiate and titrate GDMT as BP allows - Continue Jardiance 10 daily - BP may be too soft for Entresto currently (SBP low 100s) - Start Losartan 12.5 mg qhs - Start Spiro 12.5 mg daily  - Switch  blocker from Lopressor to Toprol XL    2. Type 2DM - new diagnosis, Hgb A1C 11 - Jardiance initiated by primary team  - now on statin, Crestor 20. Target LDL goal < 70   3. DLD - TG 383, HDL 27, LDL 52 - statin initiated in setting of newly diagnosed DM - consider addition of fenofibrate  - lifestyle modification through diet/ exercise/ wt loss   4. Hypothyroidism - c/w levothyroxine   5. Obesity  - Body mass index  is 57.65 kg/m.  - wt loss advised   Length of Stay: 0  Lyda Jester, PA-C  02/14/2021, 2:41 PM  Advanced Heart Failure Team Pager [phone] (M-F; [place])  Please contact [facility] for night-coverage after hours (4p -7a ) and weekends on [email or web address]  Patient seen with PA, agree with the above note.    Patient admitted with exertional  dyspnea/swelling, new diagnosis of CHF.  Echo this admission with EF 20-25%, moderately decreased RV systolic function.  ECG with old ASMI, low voltage.  No pericardial effusion on echo. Newly diagnosed with diabetes this admission, A1c 11.   She has had marked exertional dyspnea for about 2 wks but has noted some dyspnea x months. No chest pain.   General: NAD, obese.  Neck: JVP 14-16 cm, no thyromegaly or thyroid nodule.  Lungs: Clear to auscultation bilaterally with normal respiratory effort. CV: Nondisplaced PMI.  Heart regular S1/S2, no S3/S4, no murmur.  1+ edema to knees.  No carotid bruit.  Normal pedal pulses.  Abdomen: Soft, nontender, no hepatosplenomegaly, mild distention.  Skin: Intact without lesions or rashes.  Neurologic: Alert and oriented x 3.  Psych: Normal affect. Extremities: No clubbing or cyanosis.  HEENT: Normal.   New onset cardiomyopathy of uncertain etiology. No chest pain, but has uncontrolled DM and septal Qs.  Cannot rule out CAD as cause.  Also consider prior viral myocarditis or effects of diabetes itself or newly-diagnosed hypothyroidism.  No ETOH, no drugs, no FH of CAD or CMP. She is volume overloaded on exam.  - Lasix 40 mg IV bid.  - losartan 25 mg daily, if BP tolerates transition to [employer].  - Spironolactone 12.5 daily.  - Continue Jardiance.  - Agree with PICC to follow volume status more effectively (follow CVP, co-ox).  - She will need RHC/LHC when diuresed, maybe Thursday.  If no significant CAD on cath, will need cardiac MRI.   New diagnoses of DM and hypothyroidism.  Treatment per primary team.   Loralie Champagne 02/14/2021 4:40 PM

## 2021-02-14 NOTE — Plan of Care (Signed)
Nutrition Education Note  RD consulted for nutrition education regarding new onset CHF.  Spoke with pt at bedside, who reports having good appetite. She shares that she has been watching her sodium intake due to history of hypertension and recently joined [employer] for weight management. She shares that she has been on many diets in her life and is very comfortable with reading food labels.   Focus on education was on sodium intake ans self-management.   RD provided "Low Sodium Nutrition Therapy" handout from the Academy of [employer] and Dietetics. Reviewed patient's dietary recall. Provided examples on ways to decrease sodium intake in diet. Discouraged intake of processed foods and use of salt shaker. Encouraged fresh fruits and vegetables as well as whole grain sources of carbohydrates to maximize fiber intake.   RD discussed why it is important for patient to adhere to diet recommendations, and emphasized the role of fluids, foods to avoid, and importance of weighing self daily. Teach back method used.  Expect good compliance.  Body mass index is 57.65 kg/m. Pt meets criteria for extreme obesity, class III based on current BMI. Obesity is a complex, chronic medical condition that is optimally managed by a multidisciplinary care team. Weight loss is not an ideal goal for an acute inpatient hospitalization. However, if further work-up for obesity is warranted, consider outpatient referral to outpatient bariatric service and/or [place].    Current diet order is heart healthy with 1.5 L fluid restriction, patient is consuming approximately 100% of meals at this time. Labs and medications reviewed. No further nutrition interventions warranted at this time. RD contact information provided. If additional nutrition issues arise, please re-consult RD.   Loistine Chance, RD, LDN, [place] Registered Dietitian II Certified Diabetes Care and Education  Specialist Please refer to [facility] for RD and/or RD on-call/weekend/after hours pager

## 2021-02-14 NOTE — TOC Benefit Eligibility Note (Signed)
Patient Teacher, English as a foreign language completed.    The patient is currently admitted and upon discharge could be taking Farxiga 10 mg.  Prior Authorization Required  The patient is currently admitted and upon discharge could be taking Jardiance 10 mg.  Prior Authorization Required  The patient is currently admitted and upon discharge could be taking Invokana 100 mg.  Prior Authorization Required  The patient is insured through [facility] Medicaid Rx Healthy [place], [place] Patient Advocate Specialist [place] Team Direct Number: [phone]  Fax: [phone]

## 2021-02-14 NOTE — Progress Notes (Signed)
  Echocardiogram 2D Echocardiogram with contrast has been performed.  Merrie Roof F 02/14/2021, 9:57 AM

## 2021-02-14 NOTE — Progress Notes (Addendum)
Family Medicine Teaching Service Daily Progress Note Intern Pager: [phone]  Patient name: Vickie Graham Medical record number: [id number] Date of birth: [birth year] Age: 45 y.o. Gender: female  Primary Care Provider: Pcp, No Consultants: Cardiology Code Status: Full  Pt Overview and Major Events to Date:  5/30 Admitted   Assessment and Plan: Vickie Graham is a 45yo female presenting with dsypnea c/f heart failure. PMH significant for obesity.   C/f new onset HFrEF  CXR with interstitial edema and mild congestive heart failure. Echo with EF 20-25%. UOP  4783mL over last 24h s/p Lasix IV 40mg  x2. Took in 46mL po. Wt currently 133.9 kg down from 136.1kg. Cardiology consulted and will see patient today. Appreciate recommendations - f/u cards consult - incentive spirometry - stict Is and Os - daily weights - am BMP, CBC - IV lasix 40mg  once  - Tylenol 650mg  q6 prn - Metroprolol 12.5mg  - wait for cards recs regarding continuing lasix - PT/OT   DM2  A1c: 11. AM CBG 178. Required 2u aspart in last 24 hr.  - SSI - CBG - starting Jardiance 10mg    Transaminitis AST46, ALT 52 downtrending slightly from admission - continue to monitor   Hypertriglyceridemia/ Hyperlipidemia  Triglycerides 383. VLDL 77, HDL 27 - encourage exercise and weight loss  - start Crestor 20mg      Hypothyroidism TSH 13.415, free T4 0.78  - consider starting Synthroid 22mcg daily - repeat TSH in 6 weeks outpatinet   Leukocytosis WBC  WBC 12.1 down from 12.5. Pt afebrile. - continue to monitor - am CBC   Obesity/ Eating disorder BMI 57.65. Interested in losing weight. Was recently using weight watchers  - provide resources outpatient   FEN/GI: carb modified diet PPx: Lovenox   Disposition: cardiac -tele   Subjective:  No acute events overnight. Endorses abdominal pressure has improved. Denies SOB. Feels well and denies any concerns or complaints   Objective: Temp:  [97.5 F (36.4 C)-98.1 F  (36.7 C)] 97.7 F (36.5 C) (05/31 0415) Pulse Rate:  [56-132] 91 (05/31 0415) Resp:  [14-22] 16 (05/31 0415) BP: (113-147)/(85-111) 115/99 (05/31 0415) SpO2:  [92 %-98 %] 96 % (05/31 0415) Weight:  [133.9 kg-136.1 kg] 133.9 kg (05/31 0415) Physical Exam: General: alert, pleasant, NAD Cardiovascular: RRR no murmurs Respiratory: CTAB normal WOB Abdomen: soft, non distended, non tender  Extremities: warm, dry. Trace LE edema bilaterally  Laboratory: Recent Labs  Lab 02/08/21 1314 02/13/21 1200 02/14/21 0337  WBC 15.8* 12.5* 12.1*  HGB 14.8 13.8 13.9  HCT 48.6* 45.4 45.7  PLT 307 283 260   Recent Labs  Lab 02/08/21 1314 02/13/21 1200 02/13/21 1931 02/14/21 0337  NA 135 134* 131* 136  K 4.3 3.9 3.8 3.6  CL 99 103 98 98  CO2 23 21* 20* 24  BUN 14 14 14 14   CREATININE 0.85 0.83 0.99 0.95  CALCIUM 9.0 8.8* 8.2* 8.8*  PROT 6.8 5.9*  --  6.0*  BILITOT 0.8 0.9  --  0.9  ALKPHOS 135* 89  --  88  ALT 73* 57*  --  52*  AST 67* 49*  --  46*  GLUCOSE 243* 202* 271* 194*    Imaging/Diagnostic Tests: ECHOCARDIOGRAM COMPLETE  Result Date: 02/14/2021    ECHOCARDIOGRAM REPORT   Patient Name:   Vickie Graham Date of Exam: 02/14/2021 Medical Rec #:  [id number]     Height:       60.0 in Accession #:    [id number]  Weight:       295.2 lb Date of Birth:  1975/10/17      BSA:          2.202 m Patient Age:    60 years      BP:           122/79 mmHg Patient Gender: F             HR:           106 bpm. Exam Location:  Inpatient Procedure: 2D Echo, Cardiac Doppler, Color Doppler and Intracardiac            Opacification Agent  Results communicated to Dr Arby Barrette at 11:58am on 02/14/21. Indications:    R07.9* Chest pain, unspecified  History:        Patient has no prior history of Echocardiogram examinations.                 CHF, Signs/Symptoms:Shortness of Breath; Risk Factors:Morbid                 obesity. Edema.  Sonographer:    Merrie Roof RDCS Referring Phys: [id number] [place]   1. Left ventricular ejection fraction, by estimation, is 20 to 25%. The left ventricle has severely decreased function. The left ventricle demonstrates global hypokinesis. The left ventricular internal cavity size was moderately dilated. There is mild left ventricular hypertrophy. Left ventricular diastolic parameters are indeterminate.  2. Right ventricular systolic function is moderately reduced. The right ventricular size is normal. There is mildly elevated pulmonary artery systolic pressure. The estimated right ventricular systolic pressure is 44.9 mmHg.  3. Left atrial size was mildly dilated.  4. The mitral valve is normal in structure. Trivial mitral valve regurgitation.  5. The aortic valve is tricuspid. Aortic valve regurgitation is not visualized. No aortic stenosis is present.  6. The inferior vena cava is dilated in size with >50% respiratory variability, suggesting right atrial pressure of 8 mmHg. FINDINGS  Left Ventricle: Left ventricular ejection fraction, by estimation, is 20 to 25%. The left ventricle has severely decreased function. The left ventricle demonstrates global hypokinesis. Definity contrast agent was given IV to delineate the left ventricular endocardial borders. The left ventricular internal cavity size was moderately dilated. There is mild left ventricular hypertrophy. Left ventricular diastolic parameters are indeterminate. Right Ventricle: The right ventricular size is normal. Right vetricular wall thickness was not well visualized. Right ventricular systolic function is moderately reduced. There is mildly elevated pulmonary artery systolic pressure. The tricuspid regurgitant velocity is 2.65 m/s, and with an assumed right atrial pressure of 8 mmHg, the estimated right ventricular systolic pressure is 67.5 mmHg. Left Atrium: Left atrial size was mildly dilated. Right Atrium: Right atrial size was normal in size. Pericardium: There is no evidence of pericardial effusion. Mitral Valve:  The mitral valve is normal in structure. Trivial mitral valve regurgitation. Tricuspid Valve: The tricuspid valve is normal in structure. Tricuspid valve regurgitation is trivial. Aortic Valve: The aortic valve is tricuspid. Aortic valve regurgitation is not visualized. No aortic stenosis is present. Aortic valve mean gradient measures 5.0 mmHg. Aortic valve peak gradient measures 8.2 mmHg. Aortic valve area, by VTI measures 1.37 cm. Pulmonic Valve: The pulmonic valve was not well visualized. Pulmonic valve regurgitation is not visualized. Aorta: The aortic root and ascending aorta are structurally normal, with no evidence of dilitation. Venous: The inferior vena cava is dilated in size with greater than 50% respiratory variability, suggesting right atrial pressure of 8  mmHg. IAS/Shunts: The interatrial septum was not well visualized.  LEFT VENTRICLE PLAX 2D LVIDd:         6.10 cm LVIDs:         4.50 cm LV PW:         1.00 cm LV IVS:        0.90 cm LVOT diam:     2.00 cm LV SV:         30 LV SV Index:   13 LVOT Area:     3.14 cm  RIGHT VENTRICLE            IVC RV Basal diam:  3.80 cm    IVC diam: 2.10 cm RV S prime:     9.90 cm/s TAPSE (M-mode): 2.1 cm LEFT ATRIUM             Index       RIGHT ATRIUM           Index LA diam:        4.50 cm 2.04 cm/m  RA Area:     17.60 cm LA Vol (A2C):   82.2 ml 37.32 ml/m RA Volume:   47.70 ml  21.66 ml/m LA Vol (A4C):   90.4 ml 41.04 ml/m LA Biplane Vol: 89.8 ml 40.77 ml/m  AORTIC VALVE AV Area (Vmax):    1.32 cm AV Area (Vmean):   1.33 cm AV Area (VTI):     1.37 cm AV Vmax:           143.00 cm/s AV Vmean:          107.000 cm/s AV VTI:            0.215 m AV Peak Grad:      8.2 mmHg AV Mean Grad:      5.0 mmHg LVOT Vmax:         60.20 cm/s LVOT Vmean:        45.400 cm/s LVOT VTI:          0.094 m LVOT/AV VTI ratio: 0.44  AORTA Ao Root diam: 2.60 cm Ao Asc diam:  2.70 cm TRICUSPID VALVE TV Peak grad:   26.6 mmHg TV Vmax:        2.58 m/s TR Peak grad:   28.1 mmHg TR Vmax:         265.00 cm/s  SHUNTS Systemic VTI:  0.09 m Systemic Diam: 2.00 cm Oswaldo Milian MD Electronically signed by Oswaldo Milian MD Signature Date/Time: 02/14/2021/11:58:52 AM    Final     Shary Key, DO 02/14/2021, 8:26 AM PGY-1, [place] Intern pager: [phone], text pages welcome

## 2021-02-14 NOTE — Evaluation (Signed)
Physical Therapy Evaluation Patient Details Name: Vickie Graham MRN: [id number] DOB: Aug 27, 1976 Today's Date: 02/14/2021   History of Present Illness  Patient is a 45 y/o female who presents on 02/14/21 with SOB, LE edema and increased abdominal pressure. CXR- consistent with mild congestive heart failure with interstitial edema. Admitted with new onset heart failure. PMH includes depression and anxiety.  Clinical Impression  [name] Today, pt tolerated ambulation and transfers with Mod I for safety. Noted to have 2/4 DOE and required 1 seated rest break due to SOB. HR in 120s and SP02 in high 90s on RA throughout activity. Education on energy conservation techniques and importance of short bouts of activity with longer rest breaks. Recommend walking to bathroom in room as well as hallway with family/tech 3 times per day to improve endurance. Pt reports feeling close to functional baseline. Pt does not require skilled therapy services. DIscharge from therapy.    Follow Up Recommendations No PT follow up    Equipment Recommendations  None recommended by PT    Recommendations for Other Services       Precautions / Restrictions Precautions Precautions: None Restrictions Weight Bearing Restrictions: No      Mobility  Bed Mobility               General bed mobility comments: Standing in hallway with OT upon PT arrival.    Transfers Overall transfer level: Independent Equipment used: None             General transfer comment: No issues or assist to get back to EOB.  Ambulation/Gait Ambulation/Gait assistance: Independent Gait Distance (Feet): 250 Feet (x2 bouts) Assistive device: None Gait Pattern/deviations: Step-through pattern;Decreased stride length;Wide base of support   Gait velocity interpretation: 1.31 - 2.62 ft/sec, indicative of limited community ambulator General Gait Details: Steady gait with 2/4  DOE. 1 seated rest break needed. Sp02 remained in high 90s and HR in 120s bpm with activity.  Stairs            Wheelchair Mobility    Modified Rankin (Stroke Patients Only)       Balance Overall balance assessment: No apparent balance deficits (not formally assessed)                                           Pertinent Vitals/Pain Pain Assessment: No/denies pain    Home Living Family/patient expects to be discharged to:: Private residence Living Arrangements: Spouse/significant other;Children Available Help at Discharge: Family;Available 24 hours/day Type of Home: Apartment Home Access: Level entry     Home Layout: One level Home Equipment: None      Prior Function Level of Independence: Independent         Comments: works in child care, independent with ADLs/IADLs typically. does need occasional assist for L LE LB dressing due to intermittent L hip pain     Hand Dominance   Dominant Hand: Right    Extremity/Trunk Assessment   Upper Extremity Assessment Upper Extremity Assessment: Defer to OT evaluation    Lower Extremity Assessment Lower Extremity Assessment: Overall WFL for tasks assessed    Cervical / Trunk Assessment Cervical / Trunk Assessment: Normal  Communication   Communication: No difficulties  Cognition Arousal/Alertness: Awake/alert Behavior During Therapy: WFL for tasks assessed/performed Overall Cognitive Status: Within Functional Limits for tasks assessed  General Comments General comments (skin integrity, edema, etc.): HR in 120s and SP02 in high 90s on RA throughout activity.    Exercises     Assessment/Plan    PT Assessment Patent does not need any further PT services  PT Problem List         PT Treatment Interventions      PT Goals (Current goals can be found in the Care Plan section)  Acute Rehab PT Goals Patient Stated Goal: get back to  PLOF PT Goal Formulation: All assessment and education complete, DC therapy    Frequency     Barriers to discharge        Co-evaluation               AM-PAC PT "6 Clicks" Mobility  Outcome Measure Help needed turning from your back to your side while in a flat bed without using bedrails?: None Help needed moving from lying on your back to sitting on the side of a flat bed without using bedrails?: None Help needed moving to and from a bed to a chair (including a wheelchair)?: None Help needed standing up from a chair using your arms (e.g., wheelchair or bedside chair)?: None Help needed to walk in hospital room?: None Help needed climbing 3-5 steps with a railing? : A Little 6 Click Score: 23    End of Session   Activity Tolerance: Patient tolerated treatment well Patient left: in bed;with call bell/phone within reach;with family/visitor present (sitting EOB) Nurse Communication: Mobility status PT Visit Diagnosis: Other (comment) (DOE)    Time: 8101-7510 PT Time Calculation (min) (ACUTE ONLY): 14 min   Charges:   PT Evaluation $PT Eval Low Complexity: 1 Low          Vickie Graham, PT, DPT Acute Rehabilitation Services Pager [phone] Office [place] 02/14/2021, 8:37 AM

## 2021-02-14 NOTE — Discharge Instructions (Addendum)
Dear Vickie Graham,   Thank you for letting us participate in your care! In this section, you will find a brief summary of why you were admitted to the hospital, what happened during your admission, your diagnosis/diagnoses, and recommended follow up.   You were admitted because you were experiencing shortness of breath and abdominal pressure.   You were diagnosed with heart failure and diabetes.  You were treated with several new medications, including diuretics.   Your fluid overload improved and you were discharged from the hospital for meeting this goal.    POST-HOSPITAL & CARE INSTRUCTIONS 1. Several new medications were started. Please see medications section of this packet for details.  DOCTOR'S APPOINTMENTS & FOLLOW UP Future Appointments  Date Time Provider [place]  03/02/2021  3:45 PM Alcus Dad, MD [facility] [place]   Thank you for choosing [facility]! Take care and be well!  [facility]  [place], [place] [address] [phone]   Low Sodium Nutrition Therapy  Eating less sodium can help you if you have high blood pressure, heart failure, or kidney or liver disease.   Your body needs a little sodium, but too much sodium can cause your body to hold onto extra water. This extra water will raise your blood pressure and can cause damage to your heart, kidneys, or liver as they are forced to work harder.   Sometimes you can see how the extra fluid affects you because your hands, legs, or belly swell. You may also hold water around your heart and lungs, which makes it hard to breathe.   Even if you take medication for blood pressure or a water pill (diuretic) to remove fluid, it is still important to have less salt in your diet.   Check with your primary care provider before drinking alcohol since it may affect the amount of fluid in your body and how your heart,  kidneys, or liver work. Sodium in Food A low-sodium meal plan limits the sodium that you get from food and beverages to 1,500-2,000 milligrams (mg) per day. Salt is the main source of sodium. Read the nutrition label on the package to find out how much sodium is in one serving of a food.  . Select foods with 140 milligrams (mg) of sodium or less per serving.  . You may be able to eat one or two servings of foods with a little more than 140 milligrams (mg) of sodium if you are closely watching how much sodium you eat in a day.  . Check the serving size on the label. The amount of sodium listed on the label shows the amount in one serving of the food. So, if you eat more than one serving, you will get more sodium than the amount listed.  Tips Cutting Back on Sodium . Eat more fresh foods.  . Fresh fruits and vegetables are low in sodium, as well as frozen vegetables and fruits that have no added juices or sauces.  . Fresh meats are lower in sodium than processed meats, such as bacon, sausage, and hotdogs.  . Not all processed foods are unhealthy, but some processed foods may have too much sodium.  . Eat less salt at the table and when cooking. One of the ingredients in salt is sodium.  . One teaspoon of table salt has 2,300 milligrams of sodium.  . Leave the salt out of recipes for pasta,  casseroles, and soups. . Be a smart shopper.  . Food packages that say "Salt-free", sodium-free", "very low sodium," and "low sodium" have less than 140 milligrams of sodium per serving.  . Beware of products identified as "Unsalted," "No Salt Added," "Reduced Sodium," or "Lower Sodium." These items may still be high in sodium. You should always check the nutrition label. . Add flavors to your food without adding sodium.  . Try lemon juice, lime juice, or vinegar.  . Dry or fresh herbs add flavor.  Sharyn Lull a sodium-free seasoning blend or make your own at home. . You can purchase salt-free or sodium-free  condiments like barbeque sauce in stores and online. Ask your registered dietitian nutritionist for recommendations and where to find them.  .  Eating in Restaurants . Choose foods carefully when you eat outside your home. Restaurant foods can be very high in sodium. Many restaurants provide nutrition facts on their menus or their websites. If you cannot find that information, ask your server. Let your server know that you want your food to be cooked without salt and that you would like your salad dressing and sauces to be served on the side.  .   . Foods Recommended . Food Group . Foods Recommended  . Grains . Bread, bagels, rolls without salted tops Homemade bread made with reduced-sodium baking powder Cold cereals, especially shredded wheat and puffed rice Oats, grits, or cream of wheat Pastas, quinoa, and rice Popcorn, pretzels or crackers without salt Corn tortillas  . Protein Foods . Fresh meats and fish; [place] bacon (check the nutrition labels - make sure they are not packaged in a sodium solution) Canned or packed tuna (no more than 4 ounces at 1 serving) Beans and peas Soybeans) and tofu Eggs Nuts or nut butters without salt  . Dairy . Milk or milk powder Plant milks, such as rice and soy Yogurt, including Greek yogurt Small amounts of natural cheese (blocks of cheese) or reduced-sodium cheese can be used in moderation. (Swiss, ricotta, and fresh mozzarella cheese are lower in sodium than the others) Cream Cheese Low sodium cottage cheese  . Vegetables . Fresh and frozen vegetables without added sauces or salt Homemade soups (without salt) Low-sodium, salt-free or sodium-free canned vegetables and soups  . Fruit . Fresh and canned fruits Dried fruits, such as raisins, cranberries, and prunes  . Oils . Tub or liquid margarine, regular or without salt Canola, corn, peanut, olive, safflower, or sunflower oils  . Condiments . Fresh or dried herbs such as basil, bay leaf, dill,  mustard (dry), nutmeg, paprika, parsley, rosemary, sage, or thyme.  Low sodium ketchup Vinegar  Lemon or lime juice Pepper, red pepper flakes, and cayenne. Hot sauce contains sodium, but if you use just a drop or two, it will not add up to much.  Salt-free or sodium-free seasoning mixes and marinades Simple salad dressings: vinegar and oil  .  Marland Kitchen Foods Not Recommended . Food Group . Foods Not Recommended  . Grains . Breads or crackers topped with salt Cereals (hot/cold) with more than 300 mg sodium per serving Biscuits, cornbread, and other "quick" breads prepared with baking soda Pre-packaged bread crumbs Seasoned and packaged rice and pasta mixes Self-rising flours  . Protein Foods . Cured meats: Bacon, ham, sausage, pepperoni and hot dogs Canned meats (chili, vienna sausage, or sardines) Smoked fish and meats Frozen meals that have more than 600 mg of sodium per serving Egg substitute (with added sodium)  . Dairy .  Buttermilk Processed cheese spreads Cottage cheese (1 cup may have over 500 mg of sodium; look for low-sodium.) American or feta cheese Shredded Cheese has more sodium than blocks of cheese String cheese  . Vegetables . Canned vegetables (unless they are salt-free, sodium-free or low sodium) Frozen vegetables with seasoning and sauces Sauerkraut and pickled vegetables Canned or dried soups (unless they are salt-free, sodium-free, or low sodium) [place] fries and onion rings  . Fruit . Dried fruits preserved with additives that have sodium  . Oils . Salted butter or margarine, all types of olives  . Condiments . Salt, sea salt, kosher salt, onion salt, and garlic salt Seasoning mixes with salt Bouillon cubes Ketchup Barbeque sauce and Worcestershire sauce unless low sodium Soy sauce Salsa, pickles, olives, relish Salad dressings: ranch, blue cheese, [place], and [place].  .  . Low Sodium Sample 1-Day Menu  . Breakfast . 1 cup cooked oatmeal  . 1 slice whole  wheat bread toast  . 1 tablespoon peanut butter without salt  . 1 banana  . 1 cup 1% milk  . Lunch . Tacos made with: 2 corn tortillas  .  cup black beans, low sodium  .  cup roasted or grilled chicken (without skin)  .  avocado  . Squeeze of lime juice  . 1 cup salad greens  . 1 tablespoon low-sodium salad dressing  .  cup strawberries  . 1 orange  . Afternoon Snack . 1/3 cup grapes  . 6 ounces yogurt  . Evening Meal . 3 ounces herb-baked fish  . 1 baked potato  . 2 teaspoons olive oil  .  cup cooked carrots  . 2 thick slices tomatoes on:  . 2 lettuce leaves  . 1 teaspoon olive oil  . 1 teaspoon balsamic vinegar  . 1 cup 1% milk  . Evening Snack . 1 apple  .  cup almonds without salt  .  Marland Kitchen Low-Sodium Vegetarian (Lacto-Ovo) Sample 1-Day Menu  . Breakfast . 1 cup cooked oatmeal  . 1 slice whole wheat toast  . 1 tablespoon peanut butter without salt  . 1 banana  . 1 cup 1% milk  . Lunch . Tacos made with: 2 corn tortillas  .  cup black beans, low sodium  .  cup roasted or grilled chicken (without skin)  .  avocado  . Squeeze of lime juice  . 1 cup salad greens  . 1 tablespoon low-sodium salad dressing  .  cup strawberries  . 1 orange  . Evening Meal . Stir fry made with:  cup tofu  . 1 cup brown rice  .  cup broccoli  .  cup green beans  .  cup peppers  .  tablespoon peanut oil  . 1 orange  . 1 cup 1% milk  . Evening Snack . 4 strips celery  . 2 tablespoons hummus  . 1 hard-boiled egg  .  Marland Kitchen Low-Sodium Vegan Sample 1-Day Menu  . Breakfast . 1 cup cooked oatmeal  . 1 tablespoon peanut butter without salt  . 1 cup blueberries  . 1 cup soymilk fortified with calcium, vitamin B12, and vitamin D  . Lunch . 1 small whole wheat pita  .  cup cooked lentils  . 2 tablespoons hummus  . 4 carrot sticks  . 1 medium apple  . 1 cup soymilk fortified with calcium, vitamin B12, and vitamin D  . Evening Meal . Stir fry made with:  cup tofu  . 1  cup brown  rice  .  cup broccoli  .  cup green beans  .  cup peppers  .  tablespoon peanut oil  . 1 cup cantaloupe  . Evening Snack . 1 cup soy yogurt  .  cup mixed nuts  . Copyright 2020  Academy of Nutrition and Dietetics. All rights reserved .  Marland Kitchen Sodium Free Flavoring Tips .  Marland Kitchen When cooking, the following items may be used for flavoring instead of salt or seasonings that contain sodium. . Remember: A little bit of spice goes a long way! Be careful not to overseason. Marland Kitchen Spice Blend Recipe (makes about ? cup) . 5 teaspoons onion powder  . 2 teaspoons garlic powder  . 2 teaspoons paprika  . 2 teaspoon dry mustard  . 1 teaspoon crushed thyme leaves  .  teaspoon white pepper  .  teaspoon celery seed Food Item Flavorings  Beef Basil, bay leaf, caraway, curry, dill, dry mustard, garlic, grape jelly, green pepper, mace, marjoram, mushrooms (fresh), nutmeg, onion or onion powder, parsley, pepper, rosemary, sage  Chicken Basil, cloves, cranberries, mace, mushrooms (fresh), nutmeg, oregano, paprika, parsley, pineapple, saffron, sage, savory, tarragon, thyme, tomato, turmeric  Egg Chervil, curry, dill, dry mustard, garlic or garlic powder, green pepper, jelly, mushrooms (fresh), nutmeg, onion powder, paprika, parsley, rosemary, tarragon, tomato  Fish Basil, bay leaf, chervil, curry, dill, dry mustard, green pepper, lemon juice, marjoram, mushrooms (fresh), paprika, pepper, tarragon, tomato, turmeric  Lamb Cloves, curry, dill, garlic or garlic powder, mace, mint, mint jelly, onion, oregano, parsley, pineapple, rosemary, tarragon, thyme  Pork Applesauce, basil, caraway, chives, cloves, garlic or garlic powder, onion or onion powder, rosemary, thyme  Veal Apricots, basil, bay leaf, currant jelly, curry, ginger, marjoram, mushrooms (fresh), oregano, paprika  Vegetables Basil, dill, garlic or garlic powder, ginger, lemon juice, mace, marjoram, nutmeg, onion or onion powder, tarragon, tomato, sugar or  sugar substitute, salt-free salad dressing, vinegar  Desserts Allspice, anise, cinnamon, cloves, ginger, mace, nutmeg, vanilla extract, other extracts   Copyright 2020  Academy of Nutrition and Dietetics. All rights reserved

## 2021-02-14 NOTE — Hospital Course (Addendum)
Vickie Graham is a 45yo female who presented with dyspnea. PMH significant for obesity.   New onset HFrEF Patient presented with worsening dyspnea on exertion, LE edema, and upper abdominal pressure. She was found to have new onset HFrEF with echo demonstrating EF 20-25% and global LV hypokinesis. BNP 1388.9 on admission. Cardiology followed the patient throughout her hospitalization. Diuresed ***. Started on GDMT (Entresto, Toprol, Spironolactone, Jardiance). Dry weight at discharge ***.  Right and left heart cath and cardiac MRI ***. Etiology unclear- perhaps related to her diabetes or due to atrial tachycardia. ?prior viral myocarditis. Less likely her hypothyroidism.  Atrial Tachycardia Intermittently in atrial tachycardia. Treated with amiodarone. EP consulted and recommended medical management. Could consider ablation in future but currently poor candidate due to BMI.  Type 2 diabetes Patient with newly diagnosed DM. A1c 11. She was on sliding scale insulin, and was started on Jardiance 10mg . Recommended to start Metformin as well as GLP-1 prior to discharge.   Hypothyroidism  TSH 13.415, free T4 0.78. Recommend repeat TSH in 6 weeks.  Hypertriglyceridemia/ Hyperlipidemia  Triglycerides 383. VLDL 77, HDL 27. Encouraged exercise and weight loss, as well as started patient on Crestor 20mg .  Follow up recs: Outpatient sleep study   ***Cardiac Meds for Discharge   Amiodarone 400 mg bid x 1 week then 200 mg bid x 1 week then 200 mg daily after that.  Jardiance 10 mg daily  Toprol XL 25 mg daily  Crestor 20 mg daily  Entresto 24-26 mg bid  Spironolactone 25 mg daily  Lasix 40 mg daily  KCl 20 mEq daily    Will arrange post hospital f/u in the [facility] and will place appt info in AVS

## 2021-02-14 NOTE — Evaluation (Signed)
Occupational Therapy Evaluation/Discharge Patient Details Name: Vickie Graham MRN: [id number] DOB: Apr 15, 1976 Today's Date: 02/14/2021    History of Present Illness Patient is a 45 y/o female who presents on 02/14/21 with SOB, LE edema and increased abdominal pressure. CXR- consistent with mild congestive heart failure with interstitial edema. Admitted with new onset heart failure. PMH includes depression and anxiety.   Clinical Impression   PTA, pt lives with family and reports Independence with ADLs (aside for L LE dressing tasks), IADLs and mobility without AD. Pt with hx of difficulty with L LE sock/shoe mgmt due to flares of L hip pain, but able to use positional techniques or have family assist for task completion. Pt presents at baseline for ADLs and mobility without AD. HR sustained 120s bpm and reports improvement in SOB without chest pressure. Encouraged use of energy conservation strategies, elevation of B LE to prevent swelling and daily weights to monitor for any fluid accumulation. Pt verbalized understanding of all education with no further skilled OT services needed at acute level. OT to sign off.     Follow Up Recommendations  No OT follow up    Equipment Recommendations  None recommended by OT    Recommendations for Other Services       Precautions / Restrictions Precautions Precautions: Fall Restrictions Weight Bearing Restrictions: No      Mobility Bed Mobility               General bed mobility comments: sitting EOB on entry    Transfers Overall transfer level: Independent Equipment used: None                  Balance Overall balance assessment: No apparent balance deficits (not formally assessed)                                         ADL either performed or assessed with clinical judgement   ADL Overall ADL's : Needs assistance/impaired;At baseline Eating/Feeding: Independent   Grooming: Independent   Upper Body  Bathing: Independent   Lower Body Bathing: Independent   Upper Body Dressing : Independent   Lower Body Dressing: Minimal assistance;Sit to/from stand Lower Body Dressing Details (indicate cue type and reason): Min A to don L sock, intermittent flares of hip pain impact ability to reach. Reports typically can prop L LE up on bed to reach or has daughter assist as needed at home. Toilet Transfer: Dentist and Hygiene: Independent         General ADL Comments: Improving SOB, able to complete tasks at baseline level. Educated/encouraged energy conservation strategies, use of shower chair as needed if endurance deficits continue. Encouraged frequent activity, elevation of LEs to prevent swelling, daily weights to monitor for any fluid increases and sodium intake. Pt reports hx of eating disorders, with anxiety of using scale - provided encouragement and refocusing purpose of scale use     Vision Baseline Vision/History: Wears glasses Wears Glasses: At all times Patient Visual Report: No change from baseline Vision Assessment?: No apparent visual deficits     Perception     Praxis      Pertinent Vitals/Pain Pain Assessment: No/denies pain     Hand Dominance Right   Extremity/Trunk Assessment Upper Extremity Assessment Upper Extremity Assessment: Overall WFL for tasks assessed   Lower Extremity Assessment Lower Extremity Assessment: Defer to PT evaluation  Cervical / Trunk Assessment Cervical / Trunk Assessment: Normal   Communication Communication Communication: No difficulties   Cognition Arousal/Alertness: Awake/alert Behavior During Therapy: WFL for tasks assessed/performed Overall Cognitive Status: Within Functional Limits for tasks assessed                                     General Comments  HR 120s sustained with activity    Exercises     Shoulder Instructions      Home Living Family/patient expects  to be discharged to:: Private residence Living Arrangements: Spouse/significant other;Children Available Help at Discharge: Family;Available 24 hours/day Type of Home: Apartment Home Access: Level entry (through back patio)     Home Layout: One level     Bathroom Shower/Tub: Teacher, early years/pre: Standard     Home Equipment: None          Prior Functioning/Environment Level of Independence: Independent        Comments: works in child care, independent with ADLs/IADLs typically. does need occasional assist for L LE LB dressing due to intermittent L hip pain        OT Problem List: Decreased activity tolerance      OT Treatment/Interventions:      OT Goals(Current goals can be found in the care plan section) Acute Rehab OT Goals Patient Stated Goal: resolve SOB, begin monitoring daily weights OT Goal Formulation: All assessment and education complete, DC therapy  OT Frequency:     Barriers to D/C:            Co-evaluation              AM-PAC OT "6 Clicks" Daily Activity     Outcome Measure Help from another person eating meals?: None Help from another person taking care of personal grooming?: None Help from another person toileting, which includes using toliet, bedpan, or urinal?: None Help from another person bathing (including washing, rinsing, drying)?: None Help from another person to put on and taking off regular upper body clothing?: None Help from another person to put on and taking off regular lower body clothing?: A Little 6 Click Score: 23   End of Session Nurse Communication: Mobility status  Activity Tolerance: Patient tolerated treatment well Patient left: Other (comment) (with PT to ambulate in hallway)  OT Visit Diagnosis: Other (comment) (decreased cardiopulmonary tolerance)                Time: 7741-2878 OT Time Calculation (min): 14 min Charges:  OT General Charges $OT Visit: 1 Visit OT Evaluation $OT Eval Low  Complexity: 1 Low  Malachy Chamber, OTR/L Acute Rehab Services Office: [phone]  Layla Maw 02/14/2021, 8:25 AM

## 2021-02-14 NOTE — Progress Notes (Signed)
Peripherally Inserted Central Catheter Placement  The IV Nurse has discussed with the patient and/or persons authorized to consent for the patient, the purpose of this procedure and the potential benefits and risks involved with this procedure.  The benefits include less needle sticks, lab draws from the catheter, and the patient may be discharged home with the catheter. Risks include, but not limited to, infection, bleeding, blood clot (thrombus formation), and puncture of an artery; nerve damage and irregular heartbeat and possibility to perform a PICC exchange if needed/ordered by physician.  Alternatives to this procedure were also discussed.  Bard Power PICC patient education guide, fact sheet on infection prevention and patient information card has been provided to patient /or left at bedside.    PICC Placement Documentation  PICC Double Lumen 02/14/21 PICC Right Brachial 40 cm 3 cm (Active)  Indication for Insertion or Continuance of Line Chronic illness with exacerbations (CF, Sickle Cell, etc.) 02/14/21 1800  Exposed Catheter (cm) 3 cm 02/14/21 1800  Site Assessment Clean;Dry;Intact 02/14/21 1800  Lumen #1 Status Flushed;Blood return noted 02/14/21 1800  Lumen #2 Status Flushed;Blood return noted 02/14/21 1800  Dressing Type Transparent 02/14/21 1800  Dressing Status Clean;Dry;Intact 02/14/21 1800  Antimicrobial disc in place? Yes 02/14/21 1800  Dressing Change Due 02/21/21 02/14/21 1800       Jule Economy Horton 02/14/2021, 6:52 PM

## 2021-02-15 DIAGNOSIS — Z006 Encounter for examination for normal comparison and control in clinical research program: Secondary | ICD-10-CM

## 2021-02-15 DIAGNOSIS — I5021 Acute systolic (congestive) heart failure: Secondary | ICD-10-CM | POA: Diagnosis not present

## 2021-02-15 LAB — CBC
HCT: 47.3 % — ABNORMAL HIGH (ref 36.0–46.0)
Hemoglobin: 14.3 g/dL (ref 12.0–15.0)
MCH: 24.7 pg — ABNORMAL LOW (ref 26.0–34.0)
MCHC: 30.2 g/dL (ref 30.0–36.0)
MCV: 81.7 fL (ref 80.0–100.0)
Platelets: 291 10*3/uL (ref 150–400)
RBC: 5.79 MIL/uL — ABNORMAL HIGH (ref 3.87–5.11)
RDW: 17.9 % — ABNORMAL HIGH (ref 11.5–15.5)
WBC: 12.8 10*3/uL — ABNORMAL HIGH (ref 4.0–10.5)
nRBC: 0 % (ref 0.0–0.2)

## 2021-02-15 LAB — COMPREHENSIVE METABOLIC PANEL
ALT: 48 U/L — ABNORMAL HIGH (ref 0–44)
AST: 42 U/L — ABNORMAL HIGH (ref 15–41)
Albumin: 3.2 g/dL — ABNORMAL LOW (ref 3.5–5.0)
Alkaline Phosphatase: 94 U/L (ref 38–126)
Anion gap: 13 (ref 5–15)
BUN: 16 mg/dL (ref 6–20)
CO2: 32 mmol/L (ref 22–32)
Calcium: 9.2 mg/dL (ref 8.9–10.3)
Chloride: 95 mmol/L — ABNORMAL LOW (ref 98–111)
Creatinine, Ser: 1.04 mg/dL — ABNORMAL HIGH (ref 0.44–1.00)
GFR, Estimated: >60 mL/min (ref >60)
Glucose, Bld: 152 mg/dL — ABNORMAL HIGH (ref 70–99)
Potassium: 3.4 mmol/L — ABNORMAL LOW (ref 3.5–5.1)
Sodium: 140 mmol/L (ref 135–145)
Total Bilirubin: 1.3 mg/dL — ABNORMAL HIGH (ref 0.3–1.2)
Total Protein: 6.5 g/dL (ref 6.5–8.1)

## 2021-02-15 LAB — COOXEMETRY PANEL
Carboxyhemoglobin: 1.5 % (ref 0.5–1.5)
Carboxyhemoglobin: 1.8 % — ABNORMAL HIGH (ref 0.5–1.5)
Methemoglobin: 0.9 % (ref 0.0–1.5)
Methemoglobin: 0.7 % (ref 0.0–1.5)
O2 Saturation: 56 %
O2 Saturation: 73.8 %
Total hemoglobin: 14.7 g/dL (ref 12.0–16.0)
Total hemoglobin: 15.3 g/dL (ref 12.0–16.0)

## 2021-02-15 LAB — GLUCOSE, CAPILLARY
Glucose-Capillary: 165 mg/dL — ABNORMAL HIGH (ref 70–99)
Glucose-Capillary: 181 mg/dL — ABNORMAL HIGH (ref 70–99)
Glucose-Capillary: 186 mg/dL — ABNORMAL HIGH (ref 70–99)
Glucose-Capillary: 170 mg/dL — ABNORMAL HIGH (ref 70–99)

## 2021-02-15 LAB — MAGNESIUM: Magnesium: 1.8 mg/dL (ref 1.7–2.4)

## 2021-02-15 MED ORDER — POTASSIUM CHLORIDE CRYS ER 20 MEQ PO TBCR
40.0000 meq | EXTENDED_RELEASE_TABLET | Freq: Once | ORAL | Status: AC
  Administered 2021-02-15: 40 meq via ORAL
  Filled 2021-02-15: qty 2

## 2021-02-15 MED ORDER — SODIUM CHLORIDE 0.9% FLUSH: 3.0000 mL | INTRAVENOUS | Status: DC | PRN

## 2021-02-15 MED ORDER — ASPIRIN 81 MG PO CHEW
81.0000 mg | CHEWABLE_TABLET | ORAL | Status: AC
  Administered 2021-02-16: 81 mg via ORAL
  Filled 2021-02-15: qty 1

## 2021-02-15 MED ORDER — LIVING WELL WITH DIABETES BOOK
Freq: Once | Status: AC
  Filled 2021-02-15: qty 1

## 2021-02-15 MED ORDER — MAGNESIUM SULFATE 2 GM/50ML IV SOLN
2.0000 g | Freq: Once | INTRAVENOUS | Status: AC
  Administered 2021-02-15: 2 g via INTRAVENOUS
  Filled 2021-02-15: qty 50

## 2021-02-15 MED ORDER — SPIRONOLACTONE 25 MG PO TABS
25.0000 mg | ORAL_TABLET | Freq: Every day | ORAL | Status: DC
  Administered 2021-02-16 – 2021-02-20 (×5): 25 mg via ORAL
  Filled 2021-02-15 (×5): qty 1

## 2021-02-15 MED ORDER — SODIUM CHLORIDE 0.9 % IV SOLN: 250.0000 mL | INTRAVENOUS | Status: DC | PRN

## 2021-02-15 MED ORDER — SODIUM CHLORIDE 0.9% FLUSH
3.0000 mL | Freq: Two times a day (BID) | INTRAVENOUS | Status: DC
  Administered 2021-02-15 (×2): 3 mL via INTRAVENOUS

## 2021-02-15 MED ORDER — SODIUM CHLORIDE 0.9 % IV SOLN: INTRAVENOUS | Status: DC

## 2021-02-15 MED ORDER — SPIRONOLACTONE 12.5 MG HALF TABLET
12.5000 mg | ORAL_TABLET | Freq: Once | ORAL | Status: AC
  Administered 2021-02-15: 12.5 mg via ORAL
  Filled 2021-02-15: qty 1

## 2021-02-15 MED ORDER — INSULIN GLARGINE 100 UNIT/ML ~~LOC~~ SOLN
8.0000 [IU] | Freq: Every day | SUBCUTANEOUS | Status: DC
  Administered 2021-02-15 – 2021-02-17 (×3): 8 [IU] via SUBCUTANEOUS
  Filled 2021-02-15 (×5): qty 0.08

## 2021-02-15 NOTE — Progress Notes (Signed)
Family Medicine Teaching Service Daily Progress Note Intern Pager: [phone]  Patient name: Vickie Graham Medical record number: [id number] Date of birth: [birth year] Age: 45 y.o. Gender: female  Primary Care Provider: Pcp, No Consultants: Cardiology Code Status: Full  Pt Overview and Major Events to Date:  02/13/21: admitted  02/14/21: PICC placed  Assessment and Plan: Vickie Graham is a 45yo female presenting with new onset systolic heart failure. PMH significant for obesity.   New onset HFrEF  Echo with EF 20-25% and diffuse hypokinesis.  Etiology unclear (trop flat, no EtOH, HIV nonreactive, resp panel negative) Diuresing well-- had 4.3L UOP  over last 24h. Net negative 7.5L since admission. Wt currently 129kg (down 7kg since admission).  PICC placed yesterday for CVP monitoring and guidance of diuresis. -Cardiology following, appreciate recommendations -Right/left heart cath planned for tomorrow, 6/2 per cards -Lasix 40mg  IV BID, tentative plan to transition to PO tomorrow -Losartan 25mg  qhs -Increase Spironolactone to 25mg  daily per cards -Toprol XL 12.5mg  daily -Jardiance 10mg  daily -Will need outpatient sleep study -Incentive spirometry -Stict Is and Os -Daily weights -Daily BMP -Tylenol 650mg  q6 prn -PT/OT   T2DM  Newly diagnosed. A1c 11.  AM CBG 178. Required 16u SAI in last 24 hr.  - mSSI - CBG monitoring with meals and bedtime - Jardiance 10mg  daily - Start 8u Lantus today - Tentative plan to discharge on Metformin and GLP-1 in addition to [place]s - Needs PCP follow up  Transaminitis AST42, ALT 48 downtrending gradually from admission - continue to monitor   Hypertriglyceridemia/ Hyperlipidemia  Triglycerides 383. VLDL 77, HDL 27 - Encourage exercise and weight loss  - Rosuvastatin 20mg  daily   - Consider adding fenofibrate  Hypothyroidism TSH 13.415, free T4 0.78  - Continue Synthroid 57mcg daily - repeat TSH in 6 weeks outpatient   Leukocytosis   WBC stable at 12.8. Pt afebrile. - continue to monitor - am CBC   Obesity/ Eating disorder BMI 55. Interested in losing weight. Was recently using weight watchers  -PCP follow up -Likely initiate GLP-1 and Metformin on discharge  FEN/GI: carb modified diet PPx: Lovenox   Disposition: tentatively home on 6/3  Subjective:  No acute events overnight. Feels well and denies any concerns or complaints. Patient pleased with her diuresis and denies SOB.  Objective: Temp:  [97.8 F (36.6 C)-98.7 F (37.1 C)] 98.2 F (36.8 C) (06/01 0817) Pulse Rate:  [65-110] 65 (06/01 0816) Resp:  [17-19] 19 (06/01 0816) BP: (98-125)/(68-87) 98/69 (06/01 0816) SpO2:  [91 %-98 %] 91 % (06/01 0816) Weight:  [129.8 kg] 129.8 kg (06/01 0500) Physical Exam: General: alert, pleasant, NAD Cardiovascular: JVP difficult to appreciate, RRR no m/r/g Respiratory: CTAB, normal WOB Abdomen: soft, non distended, non tender  Extremities: warm, dry. Trace LE edema bilaterally  Laboratory: Recent Labs  Lab 02/13/21 1200 02/14/21 0337 02/15/21 0424  WBC 12.5* 12.1* 12.8*  HGB 13.8 13.9 14.3  HCT 45.4 45.7 47.3*  PLT 283 260 291   Recent Labs  Lab 02/13/21 1200 02/13/21 1931 02/14/21 0337 02/15/21 0424  NA 134* 131* 136 140  K 3.9 3.8 3.6 3.4*  CL 103 98 98 95*  CO2 21* 20* 24 32  BUN 14 14 14 16   CREATININE 0.83 0.99 0.95 1.04*  CALCIUM 8.8* 8.2* 8.8* 9.2  PROT 5.9*  --  6.0* 6.5  BILITOT 0.9  --  0.9 1.3*  ALKPHOS 89  --  88 94  ALT 57*  --  52* 48*  AST 49*  --  46* 42*  GLUCOSE 202* 271* 194* 152*    Imaging/Diagnostic Tests: ECHOCARDIOGRAM COMPLETE Result Date: 02/14/2021 IMPRESSIONS  1. Left ventricular ejection fraction, by estimation, is 20 to 25%. The left ventricle has severely decreased function. The left ventricle demonstrates global hypokinesis. The left ventricular internal cavity size was moderately dilated. There is mild left ventricular hypertrophy. Left ventricular  diastolic parameters are indeterminate.  2. Right ventricular systolic function is moderately reduced. The right ventricular size is normal. There is mildly elevated pulmonary artery systolic pressure. The estimated right ventricular systolic pressure is 58.7 mmHg.  3. Left atrial size was mildly dilated.  4. The mitral valve is normal in structure. Trivial mitral valve regurgitation.  5. The aortic valve is tricuspid. Aortic valve regurgitation is not visualized. No aortic stenosis is present.  6. The inferior vena cava is dilated in size with >50% respiratory variability, suggesting right atrial pressure of 8 mmHg.    Alcus Dad, MD 02/15/2021, 11:26 AM PGY-1, [place] Intern pager: [phone], text pages welcome

## 2021-02-15 NOTE — Research (Signed)
IDENTIFY Informed Consent                  Subject Name: Vickie Graham   Subject met inclusion and exclusion criteria.  The informed consent form, study requirements and expectations were reviewed with the subject and questions and concerns were addressed prior to the signing of the consent form.  The subject verbalized understanding of the trial requirements.  The subject agreed to participate in the IDENTIFY trial and signed the informed consent at 15:55PM on 02/15/21.  The informed consent was obtained prior to performance of any protocol-specific procedures for the subject.  A copy of the signed informed consent was given to the subject and a copy was placed in the subject's medical record.   Meade Maw , Naval architect

## 2021-02-15 NOTE — Progress Notes (Signed)
Inpatient Diabetes Program Recommendations  AACE/ADA: New Consensus Statement on Inpatient Glycemic Control (2015)  Target Ranges:  Prepandial:   less than 140 mg/dL      Peak postprandial:   less than 180 mg/dL (1-2 hours)      Critically ill patients:  140 - 180 mg/dL   Lab Results  Component Value Date   GLUCAP 181 (H) 02/15/2021   HGBA1C 11.0 (H) 02/13/2021    Review of Glycemic Control Results for Vickie Graham, Vickie Graham (MRN [id number]) as of 02/15/2021 14:20  Ref. Range 02/14/2021 11:19 02/14/2021 16:23 02/14/2021 21:34 02/15/2021 06:25 02/15/2021 11:10  Glucose-Capillary Latest Ref Range: 70 - 99 mg/dL 252 (H) 209 (H) 212 (H) 165 (H) 181 (H)   Diabetes history: DM 2-NEW DIAGNOSIS Outpatient Diabetes medications:  None Current orders for Inpatient glycemic control:  Novolog moderate tid with meals Jardiance 10 mg daily Lantus 8 units daily  Inpatient Diabetes Program Recommendations:    Spoke with pt about new diagnosis.  Discussed A1C results with him and explained what an A1C is, basic pathophysiology of DM Type 2, basic home care, importance of checking CBGs and maintaining good CBG control to prevent long-term and short-term complications.  Reviewed signs and symptoms of hyperglycemia and hypoglycemia.  RNs to provide ongoing basic DM education at bedside with this patient.  Have ordered educational booklet and DM videos. Note plans for possibly GLP-1 weekly at d/c.  Discussed action of SGLT-2.  Patient stated that she is aware of CHO sources but does have questions about diet with CHF and diabetes.  Will place tentative order for outpatient DM education as well.   Will follow.    Thanks  Adah Perl, RN, BC-ADM Inpatient Diabetes Coordinator Pager [phone] (8a-5p)

## 2021-02-15 NOTE — TOC Initial Note (Signed)
Transition of Care (TOC) - Initial/Assessment Note  Heart Failure   Patient Details  Name: Vickie Graham MRN: [id number] Date of Birth: 09/19/1975  [facility] of [facility]) CM/SW Contact:    [place], [place] Phone Number: 02/15/2021, 3:30 PM  Clinical Narrative:              CSW spoke with the patient at bedside and completed very brief SDOH screening with the patient who reported she doesn't have a primary care provider. CSW provided Vickie Graham with therapy resources and discussed about the [facility] and Vickie Graham reported being interested and CSW sent a referral to [place] over at [facility] clinic.  CSW was able to schedule Vickie Graham for a hospital follow up at [place] for Thursday 03/23/2021 at 1:30pm with Vickie Graham. CSW added to the patients AVS and will notify Vickie Graham.  CSW will continue to follow throughout discharge.  Expected Discharge Plan: Home/Self Care Barriers to Discharge: Continued Medical Work up   Patient Goals and CMS Choice        Expected Discharge Plan and Services Expected Discharge Plan: Home/Self Care In-house Referral: Clinical Social Work     Living arrangements for the past 2 months: Apartment                                      Prior Living Arrangements/Services Living arrangements for the past 2 months: Apartment   Patient language and need for interpreter reviewed:: Yes        Need for Family Participation in Patient Care: No (Comment) Care giver support system in place?: No (comment)   Criminal Activity/Legal Involvement Pertinent to Current Situation/Hospitalization: No - Comment as needed  Activities of Daily Living Home Assistive Devices/Equipment: None ADL Screening (condition at time of admission) Patient's cognitive ability adequate to safely complete daily activities?: Yes Is the patient deaf or have difficulty hearing?: No Does the patient have difficulty seeing,  even when wearing glasses/contacts?: No Does the patient have difficulty concentrating, remembering, or making decisions?: No Patient able to express need for assistance with ADLs?: Yes Does the patient have difficulty dressing or bathing?: No Independently performs ADLs?: Yes (appropriate for developmental age) Does the patient have difficulty walking or climbing stairs?: Yes Weakness of Legs: None Weakness of Arms/Hands: None  Permission Sought/Granted                  Emotional Assessment Appearance:: Appears stated age Attitude/Demeanor/Rapport: Engaged Affect (typically observed): Pleasant Orientation: : Oriented to Self,Oriented to Place,Oriented to  Time,Oriented to Situation   Psych Involvement: No (comment)  Admission diagnosis:  Chest pain [R07.9] Patient Active Problem List   Diagnosis Date Noted  . Acute congestive heart failure ([place])   . Abdominal pressure   . Hypothyroidism   . Chest pain 02/13/2021  . Morbid obesity ([place]) 12/13/2013   PCP:  Pcp, No Pharmacy:   [facility] [address], [place] - [place] [address] [place] [place] [address] Phone: [phone] Fax: [phone]     Social Determinants of Health (SDOH) Interventions Food Insecurity Interventions: Intervention Not Indicated Financial Strain Interventions: Intervention Not Indicated Housing Interventions: Intervention Not Indicated Transportation Interventions: Intervention Not Indicated  Readmission Risk Interventions No flowsheet data found.  Vickie Graham, MSW, [place] Heart Failure Social Worker

## 2021-02-15 NOTE — H&P (View-Only) (Signed)
Advanced Heart Failure Rounding Note  PCP-Cardiologist: Dr. Aundra Dubin ([place])  Subjective:    PICC line placed. Initial Co-ox 56%. 73% today.    4.3L in UOP yesterday. SCr 0.95>>1.04. K 3.4. SBPs upper 90s. CVP 8-9   Feeling better. No complaints today.    Objective:   Weight Range: 129.8 kg Body mass index is 55.89 kg/m.   Vital Signs:   Temp:  [97.6 F (36.4 C)-98.7 F (37.1 C)] 98.2 F (36.8 C) (06/01 0817) Pulse Rate:  [65-110] 65 (06/01 0816) Resp:  [17-19] 19 (06/01 0816) BP: (98-125)/(67-87) 98/69 (06/01 0816) SpO2:  [91 %-98 %] 91 % (06/01 0816) Weight:  [129.8 kg] 129.8 kg (06/01 0500) Last BM Date: 02/14/21  Weight change: Filed Weights   02/14/21 0415 02/15/21 0027 02/15/21 0500  Weight: 133.9 kg 129.8 kg 129.8 kg    Intake/Output:   Intake/Output Summary (Last 24 hours) at 02/15/2021 0930 Last data filed at 02/15/2021 0700 Gross per 24 hour  Intake 780 ml  Output 4170 ml  Net -3390 ml      Physical Exam    CVP 8-9  General:  Well appearing obese WF. No resp difficulty HEENT: Normal Neck: Supple. JVP 9 cm . Carotids 2+ bilat; no bruits. No lymphadenopathy or thyromegaly appreciated. Cor: PMI nondisplaced. Regular rate & rhythm. No rubs, gallops or murmurs. Lungs: Clear Abdomen: obese, soft, nontender, nondistended. No hepatosplenomegaly. No bruits or masses. Good bowel sounds. Extremities: No cyanosis, clubbing, rash, edema + RUE PICC  Neuro: Alert & orientedx3, cranial nerves grossly intact. moves all 4 extremities w/o difficulty. Affect pleasant   Telemetry   NSR 90s w/ PACs  EKG    No new EKG to review   Labs    CBC Recent Labs    02/13/21 1200 02/14/21 0337 02/15/21 0424  WBC 12.5* 12.1* 12.8*  NEUTROABS 8.6*  --   --   HGB 13.8 13.9 14.3  HCT 45.4 45.7 47.3*  MCV 82.4 81.5 81.7  PLT 283 260 751   Basic Metabolic Panel Recent Labs    02/14/21 0337 02/15/21 0424  NA 136 140  K 3.6 3.4*  CL 98 95*  CO2 24 32   GLUCOSE 194* 152*  BUN 14 16  CREATININE 0.95 1.04*  CALCIUM 8.8* 9.2   Liver Function Tests Recent Labs    02/14/21 0337 02/15/21 0424  AST 46* 42*  ALT 52* 48*  ALKPHOS 88 94  BILITOT 0.9 1.3*  PROT 6.0* 6.5  ALBUMIN 3.1* 3.2*   No results for input(s): LIPASE, AMYLASE in the last 72 hours. Cardiac Enzymes No results for input(s): CKTOTAL, CKMB, CKMBINDEX, TROPONINI in the last 72 hours.  BNP: BNP (last 3 results) Recent Labs    02/13/21 1217  BNP 1,388.9*    ProBNP (last 3 results) No results for input(s): PROBNP in the last 8760 hours.   D-Dimer No results for input(s): DDIMER in the last 72 hours. Hemoglobin A1C Recent Labs    02/13/21 1200  HGBA1C 11.0*   Fasting Lipid Panel Recent Labs    02/14/21 0337  CHOL 156  HDL 27*  LDLCALC 52  TRIG 383*  CHOLHDL 5.8   Thyroid Function Tests Recent Labs    02/13/21 1200  TSH 13.416*    Other results:   Imaging    ECHOCARDIOGRAM COMPLETE  Result Date: 02/14/2021    ECHOCARDIOGRAM REPORT   Patient Name:   Vickie Graham Date of Exam: 02/14/2021 Medical Rec #:  [id number]  Height:       60.0 in Accession #:    [id number]    Weight:       295.2 lb Date of Birth:  08/10/1976      BSA:          2.202 m Patient Age:    45 years      BP:           122/79 mmHg Patient Gender: F             HR:           106 bpm. Exam Location:  Inpatient Procedure: 2D Echo, Cardiac Doppler, Color Doppler and Intracardiac            Opacification Agent  Results communicated to Dr Arby Barrette at 11:58am on 02/14/21. Indications:    R07.9* Chest pain, unspecified  History:        Patient has no prior history of Echocardiogram examinations.                 CHF, Signs/Symptoms:Shortness of Breath; Risk Factors:Morbid                 obesity. Edema.  Sonographer:    Merrie Roof RDCS Referring Phys: [id number] [place]  1. Left ventricular ejection fraction, by estimation, is 20 to 25%. The left ventricle has severely decreased  function. The left ventricle demonstrates global hypokinesis. The left ventricular internal cavity size was moderately dilated. There is mild left ventricular hypertrophy. Left ventricular diastolic parameters are indeterminate.  2. Right ventricular systolic function is moderately reduced. The right ventricular size is normal. There is mildly elevated pulmonary artery systolic pressure. The estimated right ventricular systolic pressure is 76.1 mmHg.  3. Left atrial size was mildly dilated.  4. The mitral valve is normal in structure. Trivial mitral valve regurgitation.  5. The aortic valve is tricuspid. Aortic valve regurgitation is not visualized. No aortic stenosis is present.  6. The inferior vena cava is dilated in size with >50% respiratory variability, suggesting right atrial pressure of 8 mmHg. FINDINGS  Left Ventricle: Left ventricular ejection fraction, by estimation, is 20 to 25%. The left ventricle has severely decreased function. The left ventricle demonstrates global hypokinesis. Definity contrast agent was given IV to delineate the left ventricular endocardial borders. The left ventricular internal cavity size was moderately dilated. There is mild left ventricular hypertrophy. Left ventricular diastolic parameters are indeterminate. Right Ventricle: The right ventricular size is normal. Right vetricular wall thickness was not well visualized. Right ventricular systolic function is moderately reduced. There is mildly elevated pulmonary artery systolic pressure. The tricuspid regurgitant velocity is 2.65 m/s, and with an assumed right atrial pressure of 8 mmHg, the estimated right ventricular systolic pressure is 60.7 mmHg. Left Atrium: Left atrial size was mildly dilated. Right Atrium: Right atrial size was normal in size. Pericardium: There is no evidence of pericardial effusion. Mitral Valve: The mitral valve is normal in structure. Trivial mitral valve regurgitation. Tricuspid Valve: The tricuspid  valve is normal in structure. Tricuspid valve regurgitation is trivial. Aortic Valve: The aortic valve is tricuspid. Aortic valve regurgitation is not visualized. No aortic stenosis is present. Aortic valve mean gradient measures 5.0 mmHg. Aortic valve peak gradient measures 8.2 mmHg. Aortic valve area, by VTI measures 1.37 cm. Pulmonic Valve: The pulmonic valve was not well visualized. Pulmonic valve regurgitation is not visualized. Aorta: The aortic root and ascending aorta are structurally normal, with no evidence of dilitation. Venous: The inferior  vena cava is dilated in size with greater than 50% respiratory variability, suggesting right atrial pressure of 8 mmHg. IAS/Shunts: The interatrial septum was not well visualized.  LEFT VENTRICLE PLAX 2D LVIDd:         6.10 cm LVIDs:         4.50 cm LV PW:         1.00 cm LV IVS:        0.90 cm LVOT diam:     2.00 cm LV SV:         30 LV SV Index:   13 LVOT Area:     3.14 cm  RIGHT VENTRICLE            IVC RV Basal diam:  3.80 cm    IVC diam: 2.10 cm RV S prime:     9.90 cm/s TAPSE (M-mode): 2.1 cm LEFT ATRIUM             Index       RIGHT ATRIUM           Index LA diam:        4.50 cm 2.04 cm/m  RA Area:     17.60 cm LA Vol (A2C):   82.2 ml 37.32 ml/m RA Volume:   47.70 ml  21.66 ml/m LA Vol (A4C):   90.4 ml 41.04 ml/m LA Biplane Vol: 89.8 ml 40.77 ml/m  AORTIC VALVE AV Area (Vmax):    1.32 cm AV Area (Vmean):   1.33 cm AV Area (VTI):     1.37 cm AV Vmax:           143.00 cm/s AV Vmean:          107.000 cm/s AV VTI:            0.215 m AV Peak Grad:      8.2 mmHg AV Mean Grad:      5.0 mmHg LVOT Vmax:         60.20 cm/s LVOT Vmean:        45.400 cm/s LVOT VTI:          0.094 m LVOT/AV VTI ratio: 0.44  AORTA Ao Root diam: 2.60 cm Ao Asc diam:  2.70 cm TRICUSPID VALVE TV Peak grad:   26.6 mmHg TV Vmax:        2.58 m/s TR Peak grad:   28.1 mmHg TR Vmax:        265.00 cm/s  SHUNTS Systemic VTI:  0.09 m Systemic Diam: 2.00 cm Oswaldo Milian MD  Electronically signed by Oswaldo Milian MD Signature Date/Time: 02/14/2021/11:58:52 AM    Final    [place] EKG SITE RITE  Result Date: 02/14/2021 If Site Rite image not attached, placement could not be confirmed due to current cardiac rhythm.     Medications:     Scheduled Medications: . Chlorhexidine Gluconate Cloth  6 each Topical Daily  . empagliflozin  10 mg Oral Daily  . enoxaparin (LOVENOX) injection  65 mg Subcutaneous Q24H  . furosemide  40 mg Intravenous BID  . insulin aspart  0-15 Units Subcutaneous TID WC  . levothyroxine  25 mcg Oral Q0600  . losartan  25 mg Oral QHS  . metoprolol succinate  12.5 mg Oral Daily  . rosuvastatin  20 mg Oral Daily  . sodium chloride flush  10-40 mL Intracatheter Q12H  . spironolactone  12.5 mg Oral Daily     Infusions:   PRN Medications:  acetaminophen **OR** acetaminophen, nitroGLYCERIN, sodium chloride  flush   Assessment/Plan    1. New Systolic Heart Failure  - recent 3 week h/o progressive DOE and peripheral edema. BNP 1300. CXR c/w CHF - Echo: LVEF 20-25% w/ diffuse HK, mild LVH. RV moderately reduced, estimated RVSP 36 mmgh  - HS trop flat and low level not c/w ACS, however will need R/LHC to exclude CAD. Plan tomorrow   - TSH WNL. HIV NR. BP not markedly high. Respiratory panel negative. No FH of CHF. No ETOH  - Plan cMRI if LHC unrevealing, however BMI/obesity may prohibit  - will need outpatient sleep stud eval to r/o OSA  - she is responding well to IV Lasix. CVP 8-9.  - Continue IV Lasix 40 bid today and likely transition to PO tomorrow  - Continue to titrate GDMT as BP allows - Continue Jardiance 10 daily - Continue Losartan 25 mg daily  - Increase Spiro to 25 mg daily  - Continue Toprol XL 12.5 mg daily    2. Type 2DM - new diagnosis, Hgb A1C 11 - Jardiance initiated by primary team (careful w/ hgb A1c >10). Monitor for GU symptoms  - now on statin, Crestor 20. Target LDL goal < 70   3. DLD - TG 383,  HDL 27, LDL 52 - statin initiated in setting of newly diagnosed DM - consider addition of Vascepa - lifestyle modification through diet/ exercise/ wt loss   4. Hypothyroidism - c/w levothyroxine   5. Obesity  - Body mass index is 57.65 kg/m.  - wt loss advised   Plan [facility] tomorrow. I have reviewed the risks, indications, and alternatives to cardiac catheterization and possible angioplasty/stenting with the patient. Risks include but are not limited to bleeding, infection, vascular injury, stroke, myocardial infection, arrhythmia, kidney injury, radiation-related injury in the case of prolonged fluoroscopy use, emergency cardiac surgery, and death. The patient understands the risks of serious complication is low (<6%).     Length of Stay: [place], PA-C  02/15/2021, 9:30 AM  Advanced Heart Failure Team Pager [phone] (M-F; 7a - 5p)  Please contact [facility] for night-coverage after hours (5p -7a ) and weekends on [email or web address]  Patient seen with PA, agree with the above note.   Feels better this morning, less orthopneic.  Wants to walk.  Weight down 9 lbs.  CVP 11-12 on my read, co-ox 74%.    General: NAD Neck: JVP 10 cm, no thyromegaly or thyroid nodule.  Lungs: Clear to auscultation bilaterally with normal respiratory effort. CV: Nonpalpable PMI.  Heart regular S1/S2, no S3/S4, no murmur.  No peripheral edema.   Abdomen: Soft, nontender, no hepatosplenomegaly, no distention.  Skin: Intact without lesions or rashes.  Neurologic: Alert and oriented x 3.  Psych: Normal affect. Extremities: No clubbing or cyanosis.  HEENT: Normal.   1. Acute on chronic systolic CHF:  New onset cardiomyopathy of uncertain etiology. Echo this admission with EF 20-25%, moderately decreased RV systolic function.  No chest pain, but has uncontrolled DM and septal Qs.  Cannot rule out CAD as cause.  Also consider prior viral myocarditis or effects of diabetes itself or newly-diagnosed  hypothyroidism.  No ETOH, no drugs, no FH of CAD or CMP. She diuresed well yesterday, still somewhat volume overloaded on exam with CVP 11-12.  Co-ox good at 74%.  - Continue Lasix 40 mg IV bid today.  - losartan 25 mg daily, if BP stable tomorrow will transition to [employer].  - Increase spironolactone to 25 mg daily.  -  Continue Jardiance.  - She will need RHC/LHC tomorrow. Discussed risks/benefits, she agrees to procedure.  If no significant CAD on cath, will need cardiac MRI.  2. Type 2 diabetes: New diagnosis, per primary service.  3. Hypothyroidism: New diagnosis, per primary service.  4. Hyperlipidemia: started on statin. TGs high, consider Vascepa.   Loralie Champagne 02/15/2021 11:20 AM

## 2021-02-15 NOTE — Progress Notes (Addendum)
Advanced Heart Failure Rounding Note  PCP-Cardiologist: Dr. Aundra Dubin ([place])  Subjective:    PICC line placed. Initial Co-ox 56%. 73% today.    4.3L in UOP yesterday. SCr 0.95>>1.04. K 3.4. SBPs upper 90s. CVP 8-9   Feeling better. No complaints today.    Objective:   Weight Range: 129.8 kg Body mass index is 55.89 kg/m.   Vital Signs:   Temp:  [97.6 F (36.4 C)-98.7 F (37.1 C)] 98.2 F (36.8 C) (06/01 0817) Pulse Rate:  [65-110] 65 (06/01 0816) Resp:  [17-19] 19 (06/01 0816) BP: (98-125)/(67-87) 98/69 (06/01 0816) SpO2:  [91 %-98 %] 91 % (06/01 0816) Weight:  [129.8 kg] 129.8 kg (06/01 0500) Last BM Date: 02/14/21  Weight change: Filed Weights   02/14/21 0415 02/15/21 0027 02/15/21 0500  Weight: 133.9 kg 129.8 kg 129.8 kg    Intake/Output:   Intake/Output Summary (Last 24 hours) at 02/15/2021 0930 Last data filed at 02/15/2021 0700 Gross per 24 hour  Intake 780 ml  Output 4170 ml  Net -3390 ml      Physical Exam    CVP 8-9  General:  Well appearing obese WF. No resp difficulty HEENT: Normal Neck: Supple. JVP 9 cm . Carotids 2+ bilat; no bruits. No lymphadenopathy or thyromegaly appreciated. Cor: PMI nondisplaced. Regular rate & rhythm. No rubs, gallops or murmurs. Lungs: Clear Abdomen: obese, soft, nontender, nondistended. No hepatosplenomegaly. No bruits or masses. Good bowel sounds. Extremities: No cyanosis, clubbing, rash, edema + RUE PICC  Neuro: Alert & orientedx3, cranial nerves grossly intact. moves all 4 extremities w/o difficulty. Affect pleasant   Telemetry   NSR 90s w/ PACs  EKG    No new EKG to review   Labs    CBC Recent Labs    02/13/21 1200 02/14/21 0337 02/15/21 0424  WBC 12.5* 12.1* 12.8*  NEUTROABS 8.6*  --   --   HGB 13.8 13.9 14.3  HCT 45.4 45.7 47.3*  MCV 82.4 81.5 81.7  PLT 283 260 161   Basic Metabolic Panel Recent Labs    02/14/21 0337 02/15/21 0424  NA 136 140  K 3.6 3.4*  CL 98 95*  CO2 24 32   GLUCOSE 194* 152*  BUN 14 16  CREATININE 0.95 1.04*  CALCIUM 8.8* 9.2   Liver Function Tests Recent Labs    02/14/21 0337 02/15/21 0424  AST 46* 42*  ALT 52* 48*  ALKPHOS 88 94  BILITOT 0.9 1.3*  PROT 6.0* 6.5  ALBUMIN 3.1* 3.2*   No results for input(s): LIPASE, AMYLASE in the last 72 hours. Cardiac Enzymes No results for input(s): CKTOTAL, CKMB, CKMBINDEX, TROPONINI in the last 72 hours.  BNP: BNP (last 3 results) Recent Labs    02/13/21 1217  BNP 1,388.9*    ProBNP (last 3 results) No results for input(s): PROBNP in the last 8760 hours.   D-Dimer No results for input(s): DDIMER in the last 72 hours. Hemoglobin A1C Recent Labs    02/13/21 1200  HGBA1C 11.0*   Fasting Lipid Panel Recent Labs    02/14/21 0337  CHOL 156  HDL 27*  LDLCALC 52  TRIG 383*  CHOLHDL 5.8   Thyroid Function Tests Recent Labs    02/13/21 1200  TSH 13.416*    Other results:   Imaging    ECHOCARDIOGRAM COMPLETE  Result Date: 02/14/2021    ECHOCARDIOGRAM REPORT   Patient Name:   Vickie Graham Date of Exam: 02/14/2021 Medical Rec #:  [id number]  Height:       60.0 in Accession #:    [id number]    Weight:       295.2 lb Date of Birth:  December 16, 1975      BSA:          2.202 m Patient Age:    49 years      BP:           122/79 mmHg Patient Gender: F             HR:           106 bpm. Exam Location:  Inpatient Procedure: 2D Echo, Cardiac Doppler, Color Doppler and Intracardiac            Opacification Agent  Results communicated to Dr Arby Barrette at 11:58am on 02/14/21. Indications:    R07.9* Chest pain, unspecified  History:        Patient has no prior history of Echocardiogram examinations.                 CHF, Signs/Symptoms:Shortness of Breath; Risk Factors:Morbid                 obesity. Edema.  Sonographer:    Merrie Roof RDCS Referring Phys: [id number]  1. Left ventricular ejection fraction, by estimation, is 20 to 25%. The left ventricle has severely decreased  function. The left ventricle demonstrates global hypokinesis. The left ventricular internal cavity size was moderately dilated. There is mild left ventricular hypertrophy. Left ventricular diastolic parameters are indeterminate.  2. Right ventricular systolic function is moderately reduced. The right ventricular size is normal. There is mildly elevated pulmonary artery systolic pressure. The estimated right ventricular systolic pressure is 62.9 mmHg.  3. Left atrial size was mildly dilated.  4. The mitral valve is normal in structure. Trivial mitral valve regurgitation.  5. The aortic valve is tricuspid. Aortic valve regurgitation is not visualized. No aortic stenosis is present.  6. The inferior vena cava is dilated in size with >50% respiratory variability, suggesting right atrial pressure of 8 mmHg. FINDINGS  Left Ventricle: Left ventricular ejection fraction, by estimation, is 20 to 25%. The left ventricle has severely decreased function. The left ventricle demonstrates global hypokinesis. Definity contrast agent was given IV to delineate the left ventricular endocardial borders. The left ventricular internal cavity size was moderately dilated. There is mild left ventricular hypertrophy. Left ventricular diastolic parameters are indeterminate. Right Ventricle: The right ventricular size is normal. Right vetricular wall thickness was not well visualized. Right ventricular systolic function is moderately reduced. There is mildly elevated pulmonary artery systolic pressure. The tricuspid regurgitant velocity is 2.65 m/s, and with an assumed right atrial pressure of 8 mmHg, the estimated right ventricular systolic pressure is 52.8 mmHg. Left Atrium: Left atrial size was mildly dilated. Right Atrium: Right atrial size was normal in size. Pericardium: There is no evidence of pericardial effusion. Mitral Valve: The mitral valve is normal in structure. Trivial mitral valve regurgitation. Tricuspid Valve: The tricuspid  valve is normal in structure. Tricuspid valve regurgitation is trivial. Aortic Valve: The aortic valve is tricuspid. Aortic valve regurgitation is not visualized. No aortic stenosis is present. Aortic valve mean gradient measures 5.0 mmHg. Aortic valve peak gradient measures 8.2 mmHg. Aortic valve area, by VTI measures 1.37 cm. Pulmonic Valve: The pulmonic valve was not well visualized. Pulmonic valve regurgitation is not visualized. Aorta: The aortic root and ascending aorta are structurally normal, with no evidence of dilitation. Venous: The inferior  vena cava is dilated in size with greater than 50% respiratory variability, suggesting right atrial pressure of 8 mmHg. IAS/Shunts: The interatrial septum was not well visualized.  LEFT VENTRICLE PLAX 2D LVIDd:         6.10 cm LVIDs:         4.50 cm LV PW:         1.00 cm LV IVS:        0.90 cm LVOT diam:     2.00 cm LV SV:         30 LV SV Index:   13 LVOT Area:     3.14 cm  RIGHT VENTRICLE            IVC RV Basal diam:  3.80 cm    IVC diam: 2.10 cm RV S prime:     9.90 cm/s TAPSE (M-mode): 2.1 cm LEFT ATRIUM             Index       RIGHT ATRIUM           Index LA diam:        4.50 cm 2.04 cm/m  RA Area:     17.60 cm LA Vol (A2C):   82.2 ml 37.32 ml/m RA Volume:   47.70 ml  21.66 ml/m LA Vol (A4C):   90.4 ml 41.04 ml/m LA Biplane Vol: 89.8 ml 40.77 ml/m  AORTIC VALVE AV Area (Vmax):    1.32 cm AV Area (Vmean):   1.33 cm AV Area (VTI):     1.37 cm AV Vmax:           143.00 cm/s AV Vmean:          107.000 cm/s AV VTI:            0.215 m AV Peak Grad:      8.2 mmHg AV Mean Grad:      5.0 mmHg LVOT Vmax:         60.20 cm/s LVOT Vmean:        45.400 cm/s LVOT VTI:          0.094 m LVOT/AV VTI ratio: 0.44  AORTA Ao Root diam: 2.60 cm Ao Asc diam:  2.70 cm TRICUSPID VALVE TV Peak grad:   26.6 mmHg TV Vmax:        2.58 m/s TR Peak grad:   28.1 mmHg TR Vmax:        265.00 cm/s  SHUNTS Systemic VTI:  0.09 m Systemic Diam: 2.00 cm Oswaldo Milian MD  Electronically signed by Oswaldo Milian MD Signature Date/Time: 02/14/2021/11:58:52 AM    Final    [place] EKG SITE RITE  Result Date: 02/14/2021 If Site Rite image not attached, placement could not be confirmed due to current cardiac rhythm.     Medications:     Scheduled Medications: . Chlorhexidine Gluconate Cloth  6 each Topical Daily  . empagliflozin  10 mg Oral Daily  . enoxaparin (LOVENOX) injection  65 mg Subcutaneous Q24H  . furosemide  40 mg Intravenous BID  . insulin aspart  0-15 Units Subcutaneous TID WC  . levothyroxine  25 mcg Oral Q0600  . losartan  25 mg Oral QHS  . metoprolol succinate  12.5 mg Oral Daily  . rosuvastatin  20 mg Oral Daily  . sodium chloride flush  10-40 mL Intracatheter Q12H  . spironolactone  12.5 mg Oral Daily     Infusions:   PRN Medications:  acetaminophen **OR** acetaminophen, nitroGLYCERIN, sodium chloride  flush   Assessment/Plan    1. New Systolic Heart Failure  - recent 3 week h/o progressive DOE and peripheral edema. BNP 1300. CXR c/w CHF - Echo: LVEF 20-25% w/ diffuse HK, mild LVH. RV moderately reduced, estimated RVSP 36 mmgh  - HS trop flat and low level not c/w ACS, however will need R/LHC to exclude CAD. Plan tomorrow   - TSH WNL. HIV NR. BP not markedly high. Respiratory panel negative. No FH of CHF. No ETOH  - Plan cMRI if LHC unrevealing, however BMI/obesity may prohibit  - will need outpatient sleep stud eval to r/o OSA  - she is responding well to IV Lasix. CVP 8-9.  - Continue IV Lasix 40 bid today and likely transition to PO tomorrow  - Continue to titrate GDMT as BP allows - Continue Jardiance 10 daily - Continue Losartan 25 mg daily  - Increase Spiro to 25 mg daily  - Continue Toprol XL 12.5 mg daily    2. Type 2DM - new diagnosis, Hgb A1C 11 - Jardiance initiated by primary team (careful w/ hgb A1c >10). Monitor for GU symptoms  - now on statin, Crestor 20. Target LDL goal < 70   3. DLD - TG 383,  HDL 27, LDL 52 - statin initiated in setting of newly diagnosed DM - consider addition of Vascepa - lifestyle modification through diet/ exercise/ wt loss   4. Hypothyroidism - c/w levothyroxine   5. Obesity  - Body mass index is 57.65 kg/m.  - wt loss advised   Plan [facility] tomorrow. I have reviewed the risks, indications, and alternatives to cardiac catheterization and possible angioplasty/stenting with the patient. Risks include but are not limited to bleeding, infection, vascular injury, stroke, myocardial infection, arrhythmia, kidney injury, radiation-related injury in the case of prolonged fluoroscopy use, emergency cardiac surgery, and death. The patient understands the risks of serious complication is low (<8%).     Length of Stay: [place], PA-C  02/15/2021, 9:30 AM  Advanced Heart Failure Team Pager [phone] (M-F; 7a - 5p)  Please contact [facility] for night-coverage after hours (5p -7a ) and weekends on [email or web address]  Patient seen with PA, agree with the above note.   Feels better this morning, less orthopneic.  Wants to walk.  Weight down 9 lbs.  CVP 11-12 on my read, co-ox 74%.    General: NAD Neck: JVP 10 cm, no thyromegaly or thyroid nodule.  Lungs: Clear to auscultation bilaterally with normal respiratory effort. CV: Nonpalpable PMI.  Heart regular S1/S2, no S3/S4, no murmur.  No peripheral edema.   Abdomen: Soft, nontender, no hepatosplenomegaly, no distention.  Skin: Intact without lesions or rashes.  Neurologic: Alert and oriented x 3.  Psych: Normal affect. Extremities: No clubbing or cyanosis.  HEENT: Normal.   1. Acute on chronic systolic CHF:  New onset cardiomyopathy of uncertain etiology. Echo this admission with EF 20-25%, moderately decreased RV systolic function.  No chest pain, but has uncontrolled DM and septal Qs.  Cannot rule out CAD as cause.  Also consider prior viral myocarditis or effects of diabetes itself or newly-diagnosed  hypothyroidism.  No ETOH, no drugs, no FH of CAD or CMP. She diuresed well yesterday, still somewhat volume overloaded on exam with CVP 11-12.  Co-ox good at 74%.  - Continue Lasix 40 mg IV bid today.  - losartan 25 mg daily, if BP stable tomorrow will transition to [employer].  - Increase spironolactone to 25 mg daily.  -  Continue Jardiance.  - She will need RHC/LHC tomorrow. Discussed risks/benefits, she agrees to procedure.  If no significant CAD on cath, will need cardiac MRI.  2. Type 2 diabetes: New diagnosis, per primary service.  3. Hypothyroidism: New diagnosis, per primary service.  4. Hyperlipidemia: started on statin. TGs high, consider Vascepa.   Loralie Champagne 02/15/2021 11:20 AM

## 2021-02-15 NOTE — Plan of Care (Signed)
  Problem: Education: Goal: Knowledge of General Education information will improve Description: Including pain rating scale, medication(s)/side effects and non-pharmacologic comfort measures Outcome: Progressing   Problem: Health Behavior/Discharge Planning: Goal: Ability to manage health-related needs will improve Outcome: Progressing   Problem: Activity: Goal: Risk for activity intolerance will decrease Outcome: Progressing   

## 2021-02-16 ENCOUNTER — Other Ambulatory Visit (HOSPITAL_COMMUNITY): Payer: Self-pay

## 2021-02-16 ENCOUNTER — Inpatient Hospital Stay (HOSPITAL_COMMUNITY)
Admission: EM | Disposition: A | Discharge status: Home / Self Care | Payer: Self-pay | Source: Home / Self Care | Attending: Family Medicine

## 2021-02-16 ENCOUNTER — Telehealth (HOSPITAL_COMMUNITY): Payer: Self-pay | Admitting: Pharmacy Technician

## 2021-02-16 DIAGNOSIS — E785 Hyperlipidemia, unspecified: Secondary | ICD-10-CM

## 2021-02-16 DIAGNOSIS — I5021 Acute systolic (congestive) heart failure: Secondary | ICD-10-CM

## 2021-02-16 HISTORY — PX: RIGHT/LEFT HEART CATH AND CORONARY ANGIOGRAPHY: CATH118266

## 2021-02-16 LAB — POCT I-STAT EG7
Acid-Base Excess: 5 mmol/L — ABNORMAL HIGH (ref 0.0–2.0)
Acid-Base Excess: 6 mmol/L — ABNORMAL HIGH (ref 0.0–2.0)
Bicarbonate: 30.1 mmol/L — ABNORMAL HIGH (ref 20.0–28.0)
Bicarbonate: 31.1 mmol/L — ABNORMAL HIGH (ref 20.0–28.0)
Calcium, Ion: 1.15 mmol/L (ref 1.15–1.40)
Calcium, Ion: 1.16 mmol/L (ref 1.15–1.40)
HCT: 48 % — ABNORMAL HIGH (ref 36.0–46.0)
HCT: 48 % — ABNORMAL HIGH (ref 36.0–46.0)
Hemoglobin: 16.3 g/dL — ABNORMAL HIGH (ref 12.0–15.0)
Hemoglobin: 16.3 g/dL — ABNORMAL HIGH (ref 12.0–15.0)
O2 Saturation: 65 %
O2 Saturation: 66 %
Potassium: 4.2 mmol/L (ref 3.5–5.1)
Potassium: 4.2 mmol/L (ref 3.5–5.1)
Sodium: 142 mmol/L (ref 135–145)
Sodium: 142 mmol/L (ref 135–145)
TCO2: 31 mmol/L (ref 22–32)
TCO2: 32 mmol/L (ref 22–32)
pCO2, Ven: 44.9 mmHg (ref 44.0–60.0)
pCO2, Ven: 46.1 mmHg (ref 44.0–60.0)
pH, Ven: 7.433 — ABNORMAL HIGH (ref 7.250–7.430)
pH, Ven: 7.436 — ABNORMAL HIGH (ref 7.250–7.430)
pO2, Ven: 33 mmHg (ref 32.0–45.0)
pO2, Ven: 33 mmHg (ref 32.0–45.0)

## 2021-02-16 LAB — BASIC METABOLIC PANEL
Anion gap: 12 (ref 5–15)
BUN: 18 mg/dL (ref 6–20)
CO2: 30 mmol/L (ref 22–32)
Calcium: 9.2 mg/dL (ref 8.9–10.3)
Chloride: 99 mmol/L (ref 98–111)
Creatinine, Ser: 1.07 mg/dL — ABNORMAL HIGH (ref 0.44–1.00)
GFR, Estimated: >60 mL/min (ref >60)
Glucose, Bld: 155 mg/dL — ABNORMAL HIGH (ref 70–99)
Potassium: 3.9 mmol/L (ref 3.5–5.1)
Sodium: 141 mmol/L (ref 135–145)

## 2021-02-16 LAB — GLUCOSE, CAPILLARY
Glucose-Capillary: 155 mg/dL — ABNORMAL HIGH (ref 70–99)
Glucose-Capillary: 181 mg/dL — ABNORMAL HIGH (ref 70–99)
Glucose-Capillary: 203 mg/dL — ABNORMAL HIGH (ref 70–99)
Glucose-Capillary: 204 mg/dL — ABNORMAL HIGH (ref 70–99)
Glucose-Capillary: 295 mg/dL — ABNORMAL HIGH (ref 70–99)

## 2021-02-16 LAB — COOXEMETRY PANEL
Carboxyhemoglobin: 1.6 % — ABNORMAL HIGH (ref 0.5–1.5)
Methemoglobin: 0.8 % (ref 0.0–1.5)
O2 Saturation: 58.6 %
Total hemoglobin: 15 g/dL (ref 12.0–16.0)

## 2021-02-16 SURGERY — RIGHT/LEFT HEART CATH AND CORONARY ANGIOGRAPHY
Anesthesia: LOCAL

## 2021-02-16 MED ORDER — SODIUM CHLORIDE 0.9 % IV SOLN: 250.0000 mL | INTRAVENOUS | Status: DC | PRN

## 2021-02-16 MED ORDER — ONDANSETRON HCL 4 MG/2ML IJ SOLN: 4.0000 mg | Freq: Four times a day (QID) | INTRAMUSCULAR | Status: DC | PRN

## 2021-02-16 MED ORDER — IOHEXOL 350 MG/ML SOLN
INTRAVENOUS | Status: DC | PRN
  Administered 2021-02-16: 45 mL via INTRA_ARTERIAL

## 2021-02-16 MED ORDER — LABETALOL HCL 5 MG/ML IV SOLN: 10.0000 mg | INTRAVENOUS | Status: AC | PRN

## 2021-02-16 MED ORDER — HEPARIN SODIUM (PORCINE) 1000 UNIT/ML IJ SOLN
INTRAMUSCULAR | Status: AC
  Filled 2021-02-16: qty 1

## 2021-02-16 MED ORDER — AMIODARONE HCL IN DEXTROSE 360-4.14 MG/200ML-% IV SOLN
60.0000 mg/h | INTRAVENOUS | Status: AC
  Administered 2021-02-16 (×2): 60 mg/h via INTRAVENOUS
  Filled 2021-02-16 (×2): qty 200

## 2021-02-16 MED ORDER — SODIUM CHLORIDE 0.9% FLUSH
3.0000 mL | Freq: Two times a day (BID) | INTRAVENOUS | Status: DC
  Administered 2021-02-17 – 2021-02-20 (×3): 3 mL via INTRAVENOUS

## 2021-02-16 MED ORDER — ACETAMINOPHEN 325 MG PO TABS: 650.0000 mg | ORAL_TABLET | ORAL | Status: DC | PRN

## 2021-02-16 MED ORDER — ENOXAPARIN SODIUM 40 MG/0.4ML IJ SOSY
40.0000 mg | PREFILLED_SYRINGE | INTRAMUSCULAR | Status: DC
  Administered 2021-02-17: 40 mg via SUBCUTANEOUS
  Filled 2021-02-16: qty 0.4

## 2021-02-16 MED ORDER — FENTANYL CITRATE (PF) 100 MCG/2ML IJ SOLN
INTRAMUSCULAR | Status: AC
  Filled 2021-02-16: qty 2

## 2021-02-16 MED ORDER — FUROSEMIDE 10 MG/ML IJ SOLN
40.0000 mg | Freq: Two times a day (BID) | INTRAMUSCULAR | Status: AC
  Administered 2021-02-16 (×2): 40 mg via INTRAVENOUS
  Filled 2021-02-16 (×2): qty 4

## 2021-02-16 MED ORDER — MIDAZOLAM HCL 2 MG/2ML IJ SOLN
INTRAMUSCULAR | Status: AC
  Filled 2021-02-16: qty 2

## 2021-02-16 MED ORDER — HEPARIN (PORCINE) IN NACL 1000-0.9 UT/500ML-% IV SOLN
INTRAVENOUS | Status: AC
  Filled 2021-02-16: qty 1000

## 2021-02-16 MED ORDER — SODIUM CHLORIDE 0.9% FLUSH: 3.0000 mL | INTRAVENOUS | Status: DC | PRN

## 2021-02-16 MED ORDER — AMIODARONE HCL IN DEXTROSE 360-4.14 MG/200ML-% IV SOLN
30.0000 mg/h | INTRAVENOUS | Status: DC
  Administered 2021-02-17 (×2): 30 mg/h via INTRAVENOUS
  Filled 2021-02-16: qty 200

## 2021-02-16 MED ORDER — HYDRALAZINE HCL 20 MG/ML IJ SOLN: 10.0000 mg | INTRAMUSCULAR | Status: AC | PRN

## 2021-02-16 MED ORDER — VERAPAMIL HCL 2.5 MG/ML IV SOLN
INTRAVENOUS | Status: DC | PRN
  Administered 2021-02-16: 10 mL via INTRA_ARTERIAL

## 2021-02-16 MED ORDER — LIDOCAINE HCL (PF) 1 % IJ SOLN
INTRAMUSCULAR | Status: AC
  Filled 2021-02-16: qty 30

## 2021-02-16 MED ORDER — AMIODARONE LOAD VIA INFUSION
150.0000 mg | Freq: Once | INTRAVENOUS | Status: AC
  Administered 2021-02-16: 150 mg via INTRAVENOUS
  Filled 2021-02-16: qty 83.34

## 2021-02-16 MED ORDER — SACUBITRIL-VALSARTAN 24-26 MG PO TABS
1.0000 | ORAL_TABLET | Freq: Two times a day (BID) | ORAL | Status: DC
  Administered 2021-02-16 – 2021-02-20 (×9): 1 via ORAL
  Filled 2021-02-16 (×9): qty 1

## 2021-02-16 MED ORDER — HEPARIN (PORCINE) IN NACL 1000-0.9 UT/500ML-% IV SOLN
INTRAVENOUS | Status: DC | PRN
  Administered 2021-02-16 (×2): 500 mL

## 2021-02-16 MED ORDER — POTASSIUM CHLORIDE CRYS ER 20 MEQ PO TBCR
40.0000 meq | EXTENDED_RELEASE_TABLET | Freq: Once | ORAL | Status: AC
  Administered 2021-02-16: 40 meq via ORAL
  Filled 2021-02-16: qty 2

## 2021-02-16 MED ORDER — VERAPAMIL HCL 2.5 MG/ML IV SOLN
INTRAVENOUS | Status: AC
  Filled 2021-02-16: qty 2

## 2021-02-16 MED ORDER — HEPARIN SODIUM (PORCINE) 1000 UNIT/ML IJ SOLN
INTRAMUSCULAR | Status: DC | PRN
  Administered 2021-02-16: 6000 [IU] via INTRAVENOUS

## 2021-02-16 MED ORDER — MIDAZOLAM HCL 2 MG/2ML IJ SOLN
INTRAMUSCULAR | Status: DC | PRN
  Administered 2021-02-16: 1 mg via INTRAVENOUS
  Administered 2021-02-16: 0.5 mg via INTRAVENOUS

## 2021-02-16 MED ORDER — FENTANYL CITRATE (PF) 100 MCG/2ML IJ SOLN
INTRAMUSCULAR | Status: DC | PRN
  Administered 2021-02-16 (×2): 25 ug via INTRAVENOUS

## 2021-02-16 SURGICAL SUPPLY — 13 items
CATH 5FR JL3.5 JR4 ANG PIG MP (CATHETERS) ×1 IMPLANT
CATH SWAN GANZ 7F STRAIGHT (CATHETERS) ×1 IMPLANT
DEVICE RAD COMP TR BAND LRG (VASCULAR PRODUCTS) ×1 IMPLANT
GLIDESHEATH SLEND SS 6F .021 (SHEATH) ×1 IMPLANT
GLIDESHEATH SLENDER 7FR .021G (SHEATH) ×1 IMPLANT
GUIDEWIRE INQWIRE 1.5J.035X260 (WIRE) IMPLANT
INQWIRE 1.5J .035X260CM (WIRE) ×2
KIT HEART LEFT (KITS) ×2 IMPLANT
PACK CARDIAC CATHETERIZATION (CUSTOM PROCEDURE TRAY) ×2 IMPLANT
SHEATH PROBE COVER 6X72 (BAG) ×1 IMPLANT
TRANSDUCER W/STOPCOCK (MISCELLANEOUS) ×2 IMPLANT
TUBING ART PRESS 72  MALE/FEM (TUBING) ×2
TUBING ART PRESS 72 MALE/FEM (TUBING) IMPLANT

## 2021-02-16 NOTE — Progress Notes (Signed)
Patient ID: Vickie Graham, female   DOB: [birth year], 45 y.o.   MRN: [id number]     Advanced Heart Failure Rounding Note  PCP-Cardiologist: Dr. Aundra Dubin ([place])  Subjective:    Only got 1 dose of IV Lasix yesterday, I/Os mildly negative.    Feeling better. No complaints today.   LHC/RHC: Coronary Findings   Diagnostic Dominance: Right  Left Main  Vessel was injected. Vessel is normal in caliber. Vessel is angiographically normal.  Left Anterior Descending  No significant coronary disease but relatively small vessel.  Left Circumflex  Vessel was injected. Vessel is normal in caliber. Vessel is angiographically normal.  Right Coronary Artery  Large vessel wraps around apex, no significant coronary disease.   Intervention   No interventions have been documented.  Right Heart  Right Heart Pressures RHC Procedural Findings: Hemodynamics (mmHg) RA mean 6 RV 47/11 PA 53/20, mean 30 PCWP mean 20 LV 105/26 AO 104/79  Oxygen saturations: PA 66% AO 91%  Cardiac Output (Fick) 5.93  Cardiac Index (Fick) 2.74  PVR 1.7 WU  Cardiac Output (Thermodilution) 6.93  Cardiac Index (Thermodilution) 3.2 PVR 1.4 WU     Objective:   Weight Range: 128.9 kg Body mass index is 55.48 kg/m.   Vital Signs:   Temp:  [97.9 F (36.6 C)-98.3 F (36.8 C)] 97.9 F (36.6 C) (06/02 1143) Pulse Rate:  [80-139] 139 (06/02 1128) Resp:  [0-24] 0 (06/02 1128) BP: (102-147)/(67-101) 128/84 (06/02 1124) SpO2:  [87 %-99 %] 92 % (06/02 1128) Weight:  [128.9 kg] 128.9 kg (06/02 0257) Last BM Date: 02/16/21  Weight change: Filed Weights   02/15/21 0027 02/15/21 0500 02/16/21 0257  Weight: 129.8 kg 129.8 kg 128.9 kg    Intake/Output:   Intake/Output Summary (Last 24 hours) at 02/16/2021 1150 Last data filed at 02/16/2021 0810 Gross per 24 hour  Intake 779.11 ml  Output 950 ml  Net -170.89 ml      Physical Exam    General: NAD, obese Neck: JVP 9-10 cm, no thyromegaly or thyroid  nodule.  Lungs: Clear to auscultation bilaterally with normal respiratory effort. CV: Nondisplaced PMI.  Heart regular S1/S2, no S3/S4, no murmur.  No peripheral edema.   Abdomen: Soft, nontender, no hepatosplenomegaly, no distention.  Skin: Intact without lesions or rashes.  Neurologic: Alert and oriented x 3.  Psych: Normal affect. Extremities: No clubbing or cyanosis.  HEENT: Normal.    Telemetry   NSR 90s (personally reviewed)  EKG    No new EKG to review   Labs    CBC Recent Labs    02/13/21 1200 02/14/21 0337 02/15/21 0424  WBC 12.5* 12.1* 12.8*  NEUTROABS 8.6*  --   --   HGB 13.8 13.9 14.3  HCT 45.4 45.7 47.3*  MCV 82.4 81.5 81.7  PLT 283 260 706   Basic Metabolic Panel Recent Labs    02/15/21 0424 02/16/21 0500  NA 140 141  K 3.4* 3.9  CL 95* 99  CO2 32 30  GLUCOSE 152* 155*  BUN 16 18  CREATININE 1.04* 1.07*  CALCIUM 9.2 9.2  MG 1.8  --    Liver Function Tests Recent Labs    02/14/21 0337 02/15/21 0424  AST 46* 42*  ALT 52* 48*  ALKPHOS 88 94  BILITOT 0.9 1.3*  PROT 6.0* 6.5  ALBUMIN 3.1* 3.2*   No results for input(s): LIPASE, AMYLASE in the last 72 hours. Cardiac Enzymes No results for input(s): CKTOTAL, CKMB, CKMBINDEX, TROPONINI in the last  72 hours.  BNP: BNP (last 3 results) Recent Labs    02/13/21 1217  BNP 1,388.9*    ProBNP (last 3 results) No results for input(s): PROBNP in the last 8760 hours.   D-Dimer No results for input(s): DDIMER in the last 72 hours. Hemoglobin A1C Recent Labs    02/13/21 1200  HGBA1C 11.0*   Fasting Lipid Panel Recent Labs    02/14/21 0337  CHOL 156  HDL 27*  LDLCALC 52  TRIG 383*  CHOLHDL 5.8   Thyroid Function Tests Recent Labs    02/13/21 1200  TSH 13.416*    Other results:   Imaging    CARDIAC CATHETERIZATION  Result Date: 02/16/2021 1. PCWP and LVEDP remain elevated. 2. Pulmonary venous hypertension. 3. Preserved cardiac output. 4. No significant coronary  disease, nonischemic cardiomyopathy.     Medications:     Scheduled Medications: . [MAR Hold] Chlorhexidine Gluconate Cloth  6 each Topical Daily  . [MAR Hold] empagliflozin  10 mg Oral Daily  . [MAR Hold] enoxaparin (LOVENOX) injection  65 mg Subcutaneous Q24H  . furosemide  40 mg Intravenous BID  . [MAR Hold] insulin aspart  0-15 Units Subcutaneous TID WC  . [MAR Hold] insulin glargine  8 Units Subcutaneous Daily  . [MAR Hold] metoprolol succinate  12.5 mg Oral Daily  . [MAR Hold] rosuvastatin  20 mg Oral Daily  . sacubitril-valsartan  1 tablet Oral BID  . [MAR Hold] sodium chloride flush  10-40 mL Intracatheter Q12H  . [MAR Hold] sodium chloride flush  3 mL Intravenous Q12H  . [MAR Hold] spironolactone  25 mg Oral Daily    Infusions: . sodium chloride    . sodium chloride 10 mL/hr at 02/16/21 0620    PRN Medications: sodium chloride, [MAR Hold] acetaminophen **OR** [MAR Hold] acetaminophen, [MAR Hold] nitroGLYCERIN, [MAR Hold] sodium chloride flush, sodium chloride flush   Assessment/Plan   1. Acute on chronic systolic CHF:  New onset cardiomyopathy of uncertain etiology. Echo this admission with EF 20-25%, moderately decreased RV systolic function.  LHC/RHC with preserved cardiac output, no significant coronary disease (nonischemic cardiomyopathy).  Consider prior viral myocarditis or effects of diabetes itself or newly-diagnosed hypothyroidism.  No ETOH, no drugs, no FH of CAD or CMP. RHC shows that she is still has some volume overload.   - Continue Lasix 40 mg IV bid today, probably 1 more day.  - Stop losartan, start Entersto 24/26 bid.   - Continue sprionolactone 25 mg daily.  - Continue Jardiance.  - Cardiac MRI to assess for myocarditis/infiltrative disease.  2. Type 2 diabetes: New diagnosis, per primary service.  3. Hypothyroidism: New diagnosis, per primary service.  4. Hyperlipidemia: started on statin. TGs high, consider Vascepa.   Loralie Champagne 02/16/2021 11:50 AM

## 2021-02-16 NOTE — Progress Notes (Signed)
   02/16/21 1559  Assess: MEWS Score  Temp 98.1 F (36.7 C)  BP 95/79  Pulse Rate (!) 145  Resp 18  Level of Consciousness Alert  Assess: MEWS Score  MEWS Temp 0  MEWS Systolic 1  MEWS Pulse 3  MEWS RR 0  MEWS LOC 0  MEWS Score 4  MEWS Score Color Red  Assess: if the MEWS score is Yellow or Red  Were vital signs taken at a resting state? Yes  Focused Assessment No change from prior assessment  Early Detection of Sepsis Score *See Row Information* Low  MEWS guidelines implemented *See Row Information* Yes  Treat  Pain Scale 0-10  Pain Score 0  Take Vital Signs  Increase Vital Sign Frequency  Red: Q 1hr X 4 then Q 4hr X 4, if remains red, continue Q 4hrs  Escalate  MEWS: Escalate Red: discuss with charge nurse/RN and provider, consider discussing with RRT  Notify: Charge Nurse/RN  Name of Charge Nurse/RN Notified Tai, RN  Date Charge Nurse/RN Notified 02/16/21  Time Charge Nurse/RN Notified 0630  Notify: Provider  Provider Name/Title Ellen Henri  Date Provider Notified 02/16/21  Time Provider Notified 1610  Notification Type Page  Notification Reason Other (Comment) (Pt tachycardic)  Provider response Other (Comment);See new orders (Called RN)  Date of Provider Response 02/16/21  Time of Provider Response 1601   0932: Patient tachycardic with no SOB or chest pain. MD notified. EKG completed in chart. 1730: new orders placed (see MAR); Med given MD came to bedside. Patient resting. Will continue to monitor.

## 2021-02-16 NOTE — Progress Notes (Addendum)
FPTS Interim Progress Note  Patient sleeping and resting comfortably.  Rounded with primary RN, wondering if PICC was appropriate for procedure in am.  Double lumen PICC likely has power injection port, recommended she call VAS team in am to confirm port is appropriate.   No concerns voiced.  No orders required.  Appreciated nightly round.  Today's Vitals   02/15/21 1544 02/15/21 1923 02/15/21 2015 02/16/21 0014  BP:  102/80  105/71  Pulse:  80  (!) 105  Resp:  20  16  Temp:  98.3 F (36.8 C)  98.3 F (36.8 C)  TempSrc:  Oral  Oral  SpO2:  95%  93%  Weight:      Height:      PainSc: 0-No pain  0-No pain     Carollee Leitz, MD [facility]

## 2021-02-16 NOTE — Progress Notes (Signed)
Family Medicine Teaching Service Daily Progress Note Intern Pager: [phone]  Patient name: Vickie Graham Medical record number: [id number] Date of birth: Oct 04, 1975 Age: 45 y.o. Gender: female  Primary Care Provider: Pcp, No Consultants: Cardiology Code Status: Full  Pt Overview and Major Events to Date:  5/30: admitted 5/31: PICC placed  Assessment and Plan:  Vickie Graham is a 45yo female presenting with new onset systolic heart failure. PMH significant for obesity  New onset HFrEF  Echo with EF 20-25% and diffuse hypokinesis. Etiology unclear (trop flat, no EtOH, HIV nonreactive, resp panel negative). Has diuresed well-- 2.5L UOP  over last 24h. Net negative 8.9L since admission. Wt currently 128.9kg (down 7kg since admission). BP and renal function have tolerated diuresis. Seems to be approaching euvolemia on exam although her volume status is somewhat difficult to assess on exam alone. Of note, PICC placed on 5/31 per cardiology for CVP monitoring and guidance of diuresis. -Cardiology following, appreciate recommendations -Right/left heart cath scheduled for 1500 today -IV Lasix 40mg  BID, plan to transition to PO tomorrow -Losartan 25mg  qhs -Spironolactone to 25mg  daily -Toprol XL 12.5mg  daily -Jardiance 10mg  daily -Will need outpatient sleep study -Incentive spirometry -Stict Is and Os -Daily weights -Daily BMP -Tylenol 650mg  q6 prn -PT/OT   T2DM Newly diagnosed, A1c 11% on admission. Am CBG 155. Received 8u Lantus and 9u SAI over past 24h. -Jardiance 10mg  daily -Lantus 8u daily -mSSI -Plan to transition to Metformin and GLP-1 on discharge  Hypertriglyceridemia/HLD TG 383, HDL 27, VLDL 77. -Rosuvastatin 20mg  daily -If persistently elevated in the future, could consider Vascepa or fenofibrate. Would hold off on additional medication at this time.   Hypothyroidism TSH 13.415, free T4 wnl at 0.78 -d/c Synthroid 37mcg -Repeat TSH in 6 weeks as  outpatient  Obesity BMI 55. -PCP follow-up -Tentative plan to initiate GLP-1 and Metformin on discharge   FEN/GI: NPO pending right/left heart cath PPx: Lovenox   Status is: Inpatient Remains inpatient appropriate because:Ongoing diagnostic testing needed not appropriate for outpatient work up   Dispo:  Patient From: Home  Planned Disposition: Home  Medically stable for discharge: No      Subjective:  No acute events overnight. Patient feels well this morning. Denies complaints. No SOB. Does not feel swollen or volume overloaded.  Objective: Temp:  [97.9 F (36.6 C)-98.3 F (36.8 C)] 98 F (36.7 C) (06/02 0257) Pulse Rate:  [65-105] 105 (06/02 0014) Resp:  [16-20] 17 (06/02 0257) BP: (98-129)/(69-99) 129/99 (06/02 0257) SpO2:  [91 %-95 %] 95 % (06/02 0257) Weight:  [128.9 kg] 128.9 kg (06/02 0257) Physical Exam: General: alert, well-appearing, NAD Cardiovascular: RRR, normal S1/S2 without m/r/g, no JVD appreciated Respiratory: normal WOB, trace bibasilar crackles Abdomen: soft, nontender Extremities: no BLE edema  Laboratory: Recent Labs  Lab 02/13/21 1200 02/14/21 0337 02/15/21 0424  WBC 12.5* 12.1* 12.8*  HGB 13.8 13.9 14.3  HCT 45.4 45.7 47.3*  PLT 283 260 291   Recent Labs  Lab 02/13/21 1200 02/13/21 1931 02/14/21 0337 02/15/21 0424 02/16/21 0500  NA 134*   < > 136 140 141  K 3.9   < > 3.6 3.4* 3.9  CL 103   < > 98 95* 99  CO2 21*   < > 24 32 30  BUN 14   < > 14 16 18   CREATININE 0.83   < > 0.95 1.04* 1.07*  CALCIUM 8.8*   < > 8.8* 9.2 9.2  PROT 5.9*  --  6.0* 6.5  --  BILITOT 0.9  --  0.9 1.3*  --   ALKPHOS 89  --  88 94  --   ALT 57*  --  52* 48*  --   AST 49*  --  46* 42*  --   GLUCOSE 202*   < > 194* 152* 155*   < > = values in this interval not displayed.     Imaging/Diagnostic Tests: No new imaging over past 24h.   Alcus Dad, MD 02/16/2021, 6:34 AM PGY-1, [facility] Intern pager: [phone], text  pages welcome

## 2021-02-16 NOTE — Progress Notes (Signed)
Patient abruptly went into what appears to be atrial tachycardia with rate in the 140s after using the bathroom.  SBP 100s.  No complaints.  I will start her on amiodarone gtt to try to convert her back to NSR.  This does not look like atrial flutter or fibrillation, will not anticoagulate at this time.   Loralie Champagne 02/16/2021 6:10 PM

## 2021-02-16 NOTE — Telephone Encounter (Addendum)
Specialty Pharmacy Patient Advocate Encounter   Received notification from [facility]  that prior authorization for Vickie Graham is required.   PA submitted on [facility]Meds [facility] Status is pending   Received notification from [facility] that prior authorization for Vickie Graham is required.   PA submitted on [employer]Meds [id number] Status is pending   Will continue to follow.

## 2021-02-16 NOTE — Interval H&P Note (Signed)
History and Physical Interval Note:  02/16/2021 10:44 AM  Vickie Graham  has presented today for surgery, with the diagnosis of heart failure.  The various methods of treatment have been discussed with the patient and family. After consideration of risks, benefits and other options for treatment, the patient has consented to  Procedure(s): RIGHT/LEFT HEART CATH AND CORONARY ANGIOGRAPHY (N/A) as a surgical intervention.  The patient's history has been reviewed, patient examined, no change in status, stable for surgery.  I have reviewed the patient's chart and labs.  Questions were answered to the patient's satisfaction.     Vickie Graham [employer]

## 2021-02-17 ENCOUNTER — Encounter (HOSPITAL_COMMUNITY): Payer: Self-pay | Admitting: Cardiology

## 2021-02-17 ENCOUNTER — Inpatient Hospital Stay (HOSPITAL_COMMUNITY): Payer: Medicaid Other

## 2021-02-17 ENCOUNTER — Other Ambulatory Visit (HOSPITAL_COMMUNITY): Payer: Self-pay

## 2021-02-17 DIAGNOSIS — I471 Supraventricular tachycardia: Secondary | ICD-10-CM

## 2021-02-17 DIAGNOSIS — I5021 Acute systolic (congestive) heart failure: Secondary | ICD-10-CM

## 2021-02-17 DIAGNOSIS — I429 Cardiomyopathy, unspecified: Secondary | ICD-10-CM

## 2021-02-17 LAB — COOXEMETRY PANEL
Carboxyhemoglobin: 1.5 % (ref 0.5–1.5)
Methemoglobin: 0.9 % (ref 0.0–1.5)
O2 Saturation: 62.6 %
Total hemoglobin: 15.2 g/dL (ref 12.0–16.0)

## 2021-02-17 LAB — BASIC METABOLIC PANEL
Anion gap: 14 (ref 5–15)
BUN: 15 mg/dL (ref 6–20)
CO2: 28 mmol/L (ref 22–32)
Calcium: 8.8 mg/dL — ABNORMAL LOW (ref 8.9–10.3)
Chloride: 96 mmol/L — ABNORMAL LOW (ref 98–111)
Creatinine, Ser: 1.14 mg/dL — ABNORMAL HIGH (ref 0.44–1.00)
GFR, Estimated: >60 mL/min (ref >60)
Glucose, Bld: 273 mg/dL — ABNORMAL HIGH (ref 70–99)
Potassium: 3.9 mmol/L (ref 3.5–5.1)
Sodium: 138 mmol/L (ref 135–145)

## 2021-02-17 LAB — GLUCOSE, CAPILLARY
Glucose-Capillary: 177 mg/dL — ABNORMAL HIGH (ref 70–99)
Glucose-Capillary: 176 mg/dL — ABNORMAL HIGH (ref 70–99)
Glucose-Capillary: 343 mg/dL — ABNORMAL HIGH (ref 70–99)
Glucose-Capillary: 278 mg/dL — ABNORMAL HIGH (ref 70–99)
Glucose-Capillary: 165 mg/dL — ABNORMAL HIGH (ref 70–99)
Glucose-Capillary: 227 mg/dL — ABNORMAL HIGH (ref 70–99)
Glucose-Capillary: 177 mg/dL — ABNORMAL HIGH (ref 70–99)

## 2021-02-17 LAB — MAGNESIUM: Magnesium: 2.2 mg/dL (ref 1.7–2.4)

## 2021-02-17 MED ORDER — POTASSIUM CHLORIDE CRYS ER 20 MEQ PO TBCR
40.0000 meq | EXTENDED_RELEASE_TABLET | Freq: Once | ORAL | Status: AC
  Administered 2021-02-17: 40 meq via ORAL
  Filled 2021-02-17: qty 2

## 2021-02-17 MED ORDER — GADOBUTROL 1 MMOL/ML IV SOLN
15.0000 mL | Freq: Once | INTRAVENOUS | Status: AC | PRN
  Administered 2021-02-17: 15 mL via INTRAVENOUS

## 2021-02-17 MED ORDER — AMIODARONE HCL IN DEXTROSE 360-4.14 MG/200ML-% IV SOLN
30.0000 mg/h | INTRAVENOUS | Status: DC
  Administered 2021-02-17 – 2021-02-19 (×4): 30 mg/h via INTRAVENOUS
  Filled 2021-02-17 (×4): qty 200

## 2021-02-17 MED ORDER — ENOXAPARIN SODIUM 60 MG/0.6ML IJ SOSY
60.0000 mg | PREFILLED_SYRINGE | INTRAMUSCULAR | Status: DC
  Administered 2021-02-18 – 2021-02-20 (×3): 60 mg via SUBCUTANEOUS
  Filled 2021-02-17 (×3): qty 0.6

## 2021-02-17 MED ORDER — FUROSEMIDE 10 MG/ML IJ SOLN
80.0000 mg | Freq: Two times a day (BID) | INTRAMUSCULAR | Status: DC
  Administered 2021-02-17 – 2021-02-18 (×3): 80 mg via INTRAVENOUS
  Filled 2021-02-17 (×5): qty 8

## 2021-02-17 MED ORDER — INSULIN GLARGINE 100 UNIT/ML ~~LOC~~ SOLN
15.0000 [IU] | Freq: Every day | SUBCUTANEOUS | Status: DC
  Administered 2021-02-18 – 2021-02-20 (×3): 15 [IU] via SUBCUTANEOUS
  Filled 2021-02-17 (×3): qty 0.15

## 2021-02-17 MED ORDER — AMIODARONE HCL 200 MG PO TABS
200.0000 mg | ORAL_TABLET | Freq: Two times a day (BID) | ORAL | Status: DC
  Administered 2021-02-17: 200 mg via ORAL
  Filled 2021-02-17: qty 1

## 2021-02-17 MED ORDER — AMIODARONE LOAD VIA INFUSION
150.0000 mg | Freq: Once | INTRAVENOUS | Status: AC
  Administered 2021-02-17: 150 mg via INTRAVENOUS
  Filled 2021-02-17: qty 83.34

## 2021-02-17 MED FILL — Lidocaine HCl Local Preservative Free (PF) Inj 1%: INTRAMUSCULAR | Qty: 30 | Status: AC

## 2021-02-17 NOTE — Progress Notes (Signed)
Patient ID: Vickie Graham, female   DOB: 06-Oct-1975, 45 y.o.   MRN: [id number]     Advanced Heart Failure Rounding Note  PCP-Cardiologist: Dr. Aundra Dubin ([place])  Subjective:    Feels "swollen" today.  I/Os negative and weight down, but CVP still 13-15 range.  Co-ox 63%.   Yesterday evening went into atrial tachycardia with rate in the 140s, started on amiodarone gtt and converted back to NSR.  She is in NSR this morning.   LHC/RHC: Coronary Findings   Diagnostic Dominance: Right  Left Main  Vessel was injected. Vessel is normal in caliber. Vessel is angiographically normal.  Left Anterior Descending  No significant coronary disease but relatively small vessel.  Left Circumflex  Vessel was injected. Vessel is normal in caliber. Vessel is angiographically normal.  Right Coronary Artery  Large vessel wraps around apex, no significant coronary disease.   Intervention   No interventions have been documented.  Right Heart  Right Heart Pressures RHC Procedural Findings: Hemodynamics (mmHg) RA mean 6 RV 47/11 PA 53/20, mean 30 PCWP mean 20 LV 105/26 AO 104/79  Oxygen saturations: PA 66% AO 91%  Cardiac Output (Fick) 5.93  Cardiac Index (Fick) 2.74  PVR 1.7 WU  Cardiac Output (Thermodilution) 6.93  Cardiac Index (Thermodilution) 3.2 PVR 1.4 WU     Objective:   Weight Range: 126.8 kg Body mass index is 54.59 kg/m.   Vital Signs:   Temp:  [97.7 F (36.5 C)-98.1 F (36.7 C)] 97.9 F (36.6 C) (06/03 0400) Pulse Rate:  [95-145] 95 (06/03 0400) Resp:  [0-32] 19 (06/03 0400) BP: (95-149)/(60-127) 119/87 (06/03 0400) SpO2:  [87 %-99 %] 94 % (06/03 0400) Weight:  [126.8 kg] 126.8 kg (06/03 0400) Last BM Date: 02/16/21  Weight change: Filed Weights   02/15/21 0500 02/16/21 0257 02/17/21 0400  Weight: 129.8 kg 128.9 kg 126.8 kg    Intake/Output:   Intake/Output Summary (Last 24 hours) at 02/17/2021 0911 Last data filed at 02/17/2021 0500 Gross per 24 hour   Intake 1182.87 ml  Output 2550 ml  Net -1367.13 ml      Physical Exam    General: NAD, obese.  Neck: JVP 12+ cm, no thyromegaly or thyroid nodule.  Lungs: Clear to auscultation bilaterally with normal respiratory effort. CV: Nondisplaced PMI.  Heart regular S1/S2, no S3/S4, no murmur.  1+ ankle edema.  Abdomen: Soft, nontender, no hepatosplenomegaly, mild distention.  Skin: Intact without lesions or rashes.  Neurologic: Alert and oriented x 3.  Psych: Normal affect. Extremities: No clubbing or cyanosis.  HEENT: Normal.    Telemetry   NSR 80s-90s (personally reviewed)  EKG    No new EKG to review   Labs    CBC Recent Labs    02/15/21 0424 02/16/21 1059 02/16/21 1100  WBC 12.8*  --   --   HGB 14.3 16.3* 16.3*  HCT 47.3* 48.0* 48.0*  MCV 81.7  --   --   PLT 291  --   --    Basic Metabolic Panel Recent Labs    02/15/21 0424 02/16/21 0500 02/16/21 1059 02/16/21 1100 02/17/21 0447  NA 140 141   < > 142 138  K 3.4* 3.9   < > 4.2 3.9  CL 95* 99  --   --  96*  CO2 32 30  --   --  28  GLUCOSE 152* 155*  --   --  273*  BUN 16 18  --   --  15  CREATININE  1.04* 1.07*  --   --  1.14*  CALCIUM 9.2 9.2  --   --  8.8*  MG 1.8  --   --   --  2.2   < > = values in this interval not displayed.   Liver Function Tests Recent Labs    02/15/21 0424  AST 42*  ALT 48*  ALKPHOS 94  BILITOT 1.3*  PROT 6.5  ALBUMIN 3.2*   No results for input(s): LIPASE, AMYLASE in the last 72 hours. Cardiac Enzymes No results for input(s): CKTOTAL, CKMB, CKMBINDEX, TROPONINI in the last 72 hours.  BNP: BNP (last 3 results) Recent Labs    02/13/21 1217  BNP 1,388.9*    ProBNP (last 3 results) No results for input(s): PROBNP in the last 8760 hours.   D-Dimer No results for input(s): DDIMER in the last 72 hours. Hemoglobin A1C No results for input(s): HGBA1C in the last 72 hours. Fasting Lipid Panel No results for input(s): CHOL, HDL, LDLCALC, TRIG, CHOLHDL, LDLDIRECT  in the last 72 hours. Thyroid Function Tests No results for input(s): TSH, T4TOTAL, T3FREE, THYROIDAB in the last 72 hours.  Invalid input(s): FREET3  Other results:   Imaging    CARDIAC CATHETERIZATION  Result Date: 02/16/2021 1. PCWP and LVEDP remain elevated. 2. Pulmonary venous hypertension. 3. Preserved cardiac output. 4. No significant coronary disease, nonischemic cardiomyopathy.     Medications:     Scheduled Medications: . Chlorhexidine Gluconate Cloth  6 each Topical Daily  . empagliflozin  10 mg Oral Daily  . enoxaparin (LOVENOX) injection  40 mg Subcutaneous Q24H  . insulin aspart  0-15 Units Subcutaneous TID WC  . insulin glargine  8 Units Subcutaneous Daily  . metoprolol succinate  12.5 mg Oral Daily  . rosuvastatin  20 mg Oral Daily  . sacubitril-valsartan  1 tablet Oral BID  . sodium chloride flush  10-40 mL Intracatheter Q12H  . sodium chloride flush  3 mL Intravenous Q12H  . spironolactone  25 mg Oral Daily    Infusions: . sodium chloride    . amiodarone 30 mg/hr (02/17/21 0451)    PRN Medications: sodium chloride, acetaminophen, nitroGLYCERIN, ondansetron (ZOFRAN) IV, sodium chloride flush, sodium chloride flush   Assessment/Plan   1. Acute on chronic systolic CHF:  New onset cardiomyopathy of uncertain etiology. Echo this admission with EF 20-25%, moderately decreased RV systolic function.  LHC/RHC with preserved cardiac output, no significant coronary disease (nonischemic cardiomyopathy).  Consider prior viral myocarditis or effects of diabetes itself or newly-diagnosed hypothyroidism.  No ETOH, no drugs, no FH of CAD or CMP.   Still volume overloaded today, CVP 13-15 with co-ox 63%.  - Lasix 80 mg IV bid today.    - Continue Entresto 24/26 bid.   - Continue sprionolactone 25 mg daily.  - Continue Toprol XL 12.5 daily.  - Continue Jardiance.  - Cardiac MRI to assess for myocarditis/infiltrative disease.  2. Type 2 diabetes: New diagnosis, per  primary service.  3. Hypothyroidism: New diagnosis, per primary service.  4. Hyperlipidemia: started on statin. TGs high, consider Vascepa.  5. Atrial tachycardia: Noted on 6/2 with HR in 140s.  Now back in NSR on amiodarone gtt.  - Transition amiodarone to po.  - Would avoid sending home on amiodarone, will stop amiodarone at discharge and increase Toprol XL.   Loralie Champagne 02/17/2021 9:11 AM

## 2021-02-17 NOTE — Progress Notes (Signed)
FPTS Interim Progress Note  S:  Patient resting comfortably.  O: BP 106/71 (BP Location: Left Wrist)   Pulse 96   Temp 97.8 F (36.6 C) (Oral)   Resp 18   Ht 5' (1.524 m)   Wt 126.8 kg Comment: scale a  SpO2 95%   BMI 54.59 kg/m     A/P: Patient no longer tachycardic and BP stable. - Continue Amiodarone drip per Cards  Delora Fuel, MD 02/17/2021, 11:19 PM PGY-1, [facility] Service pager [phone]

## 2021-02-17 NOTE — Progress Notes (Signed)
Family Medicine Teaching Service Daily Progress Note Intern Pager: [phone]  Patient name: Vickie Graham Medical record number: [id number] Date of birth: [birth year] Age: 45 y.o. Gender: female  Primary Care Provider: Pcp, No Consultants: Cardiology Code Status: Full  Pt Overview and Major Events to Date:  5/30: admitted 5/31: PICC placed in RUE 6/2: right & left heart cath  Assessment and Plan:  Vickie Graham is a 45yo female presenting withnew onset systolic heart failure. PMH significant for obesity.  New Onset HFrEF Echo on admission showed EF 20-25% and diffuse hypokinesis. Right and left heart cath performed yesterday demonstrated preserved cardiac output and no significant coronary disease. Etiology diabetes vs ?prior viral myocarditis. Has responded well to diuresis with IV Lasix. 2.7L UOP over past 24h, net negative 10.5L since admission. Current weight 126kg (-10kg from admission). -Cardiology following, appreciate recommendations -Diuresis per cardiology (IV Lasix 80mg  BID today) -Cardiac MRI -Continue Entresto, Spironolactone, and Jardiance per cardiology -Strict I/Os, daily weights -Daily BMP -Needs outpatient sleep study  Atrial Tachycardia Patient with episode of atrial tachycardia yesterday afternoon. Started on amio gtt. This morning NSR with HR 90s. -Switch amio to PO per cardiology -Tele monitoring  T2DM Newly diagnosed, A1c 11% on admission. Am CBG 177. Received 8u Lantus and 8u SAI over past 24h. -Jardiance 10mg  daily -Lantus 8u daily -mSSI -Plan to transition to Metformin and GLP-1 on discharge  Hypertriglyceridemia/HLD TG 383, HDL 27, VLDL 77 on admission. -Rosuvastatin 20mg  daily -If persistently elevated in the future, could consider Vascepa or fenofibrate. Would hold off on additional medication at this time.   Hypothyroidism TSH 13.415, free T4 wnl at 0.78 -Repeat TSH in 6 weeks as outpatient  Obesity BMI 55. -PCP follow-up -Tentative  plan to initiate GLP-1 and Metformin on discharge   FEN/GI: carb modified/heart healthy diet PPx: Lovenox   Status is: Inpatient Remains inpatient appropriate because:Ongoing diagnostic testing needed not appropriate for outpatient work up and IV treatments appropriate due to intensity of illness or inability to take PO   Dispo:  Patient From: Home  Planned Disposition: Home  Medically stable for discharge: No      Subjective:  No acute events overnight. Patient feels well this morning. No SOB or other complaints. States she is urinating less and feels like she might have some fluid in her abdomen again.  Objective: Temp:  [97.7 F (36.5 C)-98.1 F (36.7 C)] 97.9 F (36.6 C) (06/03 0400) Pulse Rate:  [95-145] 95 (06/03 0400) Resp:  [0-32] 19 (06/03 0400) BP: (95-149)/(60-127) 119/87 (06/03 0400) SpO2:  [87 %-99 %] 94 % (06/03 0400) Weight:  [126.8 kg] 126.8 kg (06/03 0400) Physical Exam: General: alert, well-appearing, NAD Cardiovascular: RRR, normal S1/S2 without m/r/g, no JVD appreciated Respiratory: normal WOB, trace bibasilar crackles Abdomen: soft, nontender Extremities: no BLE edema  Laboratory: Recent Labs  Lab 02/13/21 1200 02/14/21 0337 02/15/21 0424 02/16/21 1059 02/16/21 1100  WBC 12.5* 12.1* 12.8*  --   --   HGB 13.8 13.9 14.3 16.3* 16.3*  HCT 45.4 45.7 47.3* 48.0* 48.0*  PLT 283 260 291  --   --    Recent Labs  Lab 02/13/21 1200 02/13/21 1931 02/14/21 0337 02/15/21 0424 02/16/21 0500 02/16/21 1059 02/16/21 1100 02/17/21 0447  NA 134*   < > 136 140 141 142 142 138  K 3.9   < > 3.6 3.4* 3.9 4.2 4.2 3.9  CL 103   < > 98 95* 99  --   --  96*  CO2 21*   < >  24 32 30  --   --  28  BUN 14   < > 14 16 18   --   --  15  CREATININE 0.83   < > 0.95 1.04* 1.07*  --   --  1.14*  CALCIUM 8.8*   < > 8.8* 9.2 9.2  --   --  8.8*  PROT 5.9*  --  6.0* 6.5  --   --   --   --   BILITOT 0.9  --  0.9 1.3*  --   --   --   --   ALKPHOS 89  --  88 94  --   --    --   --   ALT 57*  --  52* 48*  --   --   --   --   AST 49*  --  46* 42*  --   --   --   --   GLUCOSE 202*   < > 194* 152* 155*  --   --  273*   < > = values in this interval not displayed.    Imaging/Diagnostic Tests: CARDIAC CATHETERIZATION  Result Date: 02/16/2021 1. PCWP and LVEDP remain elevated. 2. Pulmonary venous hypertension. 3. Preserved cardiac output. 4. No significant coronary disease, nonischemic cardiomyopathy.     Alcus Dad, MD 02/17/2021, 6:21 AM PGY-1, [place] Intern pager: [phone], text pages welcome

## 2021-02-17 NOTE — Progress Notes (Signed)
FPTS Interim Progress Note  Patient sleeping and resting comfortably. Easily to arouse.  Denies any chest pain or heart palpitations.  Right radial sheath site without hematoma or erythema. Left upper arm sheath site dressing remains dry and intact.  Distal pulses present bilaterally.  Irregular rhythm on monitor cannot distinguish pwaves. Had received Amiodarone 150 mg bolus and then started on Amiodarone drip protocol.  Has PICC in RUA. Rounded with primary RN.  No concerns voiced.  No orders required.  Appreciated nightly round.  Today's Vitals   02/16/21 1800 02/16/21 1815 02/16/21 1924 02/16/21 2342  BP: 116/71  116/77 100/60  Pulse:   (!) 103 96  Resp: (!) 32 18 18 18   Temp:   97.8 F (36.6 C) 97.7 F (36.5 C)  TempSrc:   Oral Oral  SpO2: 91% 95% 95% 93%  Weight:      Height:      PainSc:   0-No pain    Repeat ECG in am BMP/Mag in am PICC removal when appropriate  Carollee Leitz, MD Family Medicine Residency

## 2021-02-17 NOTE — Consult Note (Addendum)
ELECTROPHYSIOLOGY CONSULT NOTE    Patient ID: Vickie Graham MRN: [id number], DOB/AGE: August 06, 1976 45 y.o.  Admit date: 02/13/2021 Date of Consult: 02/17/2021  Primary Physician: Alcus Dad, MD Primary Cardiologist: New to Dr. Aundra Dubin  Electrophysiologist: New  Referring Provider: Dr. Aundra Dubin  Patient Profile: [name] is a 45 y.o. female with a history of new acute systolic CHF, obesity, ADHD, anxiety, depression, hypothyroidism, GERD, and PACs who is being seen today for the evaluation of SVT at the request of Dr. Aundra Dubin.  HPI:  Vickie Graham is a 45 y.o. female with medical history as above.   Admitted 5/30 with complaints of 3 week h/o progressive DOE, LEE and abdominal fullness. Found to be in acute CHF. BNP 1388. CXR w/ mild congestive CHF w/ interstitial edema. EKG showed sinus tach 120 bpm. HS trop 48>>50.  TSH elevated at 13.416. Free T4 WNL at 0.78. WBC 12.5, AST 49, ALT 57. SCr 0.83. K 3.9. Na 134. Chest CT negative for PE.   Echo 02/14/2021 showed LVEF 20-25% which is new diagnosis for her. Also with new diagnosis of DM2. Hgb A1c of 11.   [facility] 02/16/2021 showed elevated filling pressures with pulmonary venous hypertension, preserved cardiac output, No significant coronary disease -> NICM  Pt noted to have intermittent atrial tachycardia on 6/2 with HRs in the 140s. EP asked to see for consideration of ablation.   She is feeling OK currently. No SOB at rest. She is worried she hasn't had as much UOP as she expected.   Past Medical History:  Diagnosis Date  . ADHD (attention deficit hyperactivity disorder)    diagnosed at [facility] clinic.  Marland Kitchen Allergy   . Anxiety    Social anxiety, generalized anxiety disorder.  . Depression      Surgical History:  Past Surgical History:  Procedure Laterality Date  . RIGHT/LEFT HEART CATH AND CORONARY ANGIOGRAPHY N/A 02/16/2021   Procedure: RIGHT/LEFT HEART CATH AND CORONARY ANGIOGRAPHY;  Surgeon: Larey Dresser, MD;  Location: [facility];  Service: Cardiovascular;  Laterality: N/A;     Medications Prior to Admission  Medication Sig Dispense Refill Last Dose  . pantoprazole (PROTONIX) 40 MG tablet Take 1 tablet (40 mg total) by mouth daily. 30 tablet 0 02/13/2021 at Unknown time  . sucralfate (CARAFATE) 1 g tablet Take 1 tablet (1 g total) by mouth 4 (four) times daily -  with meals and at bedtime. Dissolve 1 tablet into a glass of water and drink 120 tablet 0 02/12/2021 at Unknown time    Inpatient Medications:  . amiodarone  150 mg Intravenous Once  . Chlorhexidine Gluconate Cloth  6 each Topical Daily  . empagliflozin  10 mg Oral Daily  . [START ON 02/18/2021] enoxaparin (LOVENOX) injection  60 mg Subcutaneous Q24H  . furosemide  80 mg Intravenous BID  . insulin aspart  0-15 Units Subcutaneous TID WC  . insulin glargine  8 Units Subcutaneous Daily  . metoprolol succinate  12.5 mg Oral Daily  . rosuvastatin  20 mg Oral Daily  . sacubitril-valsartan  1 tablet Oral BID  . sodium chloride flush  10-40 mL Intracatheter Q12H  . sodium chloride flush  3 mL Intravenous Q12H  . spironolactone  25 mg Oral Daily    Allergies:  Allergies  Allergen Reactions  . Sulfa Antibiotics     Social History   Socioeconomic History  . Marital status: Divorced    Spouse name: Not on file  . Number  of children: Not on file  . Years of education: Not on file  . Highest education level: Not on file  Occupational History  . Not on file  Tobacco Use  . Smoking status: Former Smoker    Quit date: 01/12/2010    Years since quitting: 11.1  . Smokeless tobacco: Never Used  Vaping Use  . Vaping Use: Never used  Substance and Sexual Activity  . Alcohol use: No  . Drug use: No  . Sexual activity: Not on file  Other Topics Concern  . Not on file  Social History Narrative   Marital status: divorced and engaged since 2011.     Children: 2 children      Lives with fiance and two children (97, 10)      Employment:  childcare early development; 3s and 4s. Surveyor, quantity      Tobacco: none      Alcohol: none      Drugs; None      Exercise: sporadic.           Social Determinants of Health   Financial Resource Strain: Medium Risk  . Difficulty of Paying Living Expenses: Somewhat hard  Food Insecurity: No Food Insecurity  . Worried About Charity fundraiser in the Last Year: Never true  . Ran Out of Food in the Last Year: Never true  Transportation Needs: No Transportation Needs  . Lack of Transportation (Medical): No  . Lack of Transportation (Non-Medical): No  Physical Activity: Not on file  Stress: Not on file  Social Connections: Not on file  Intimate Partner Violence: Not on file     History reviewed. No pertinent family history.   Review of Systems: All other systems reviewed and are otherwise negative except as noted above.  Physical Exam: Vitals:   02/16/21 1924 02/16/21 2342 02/17/21 0400 02/17/21 1135  BP: 116/77 100/60 119/87 106/71  Pulse: (!) 103 96 95 97  Resp: 18 18 19 18   Temp: 97.8 F (36.6 C) 97.7 F (36.5 C) 97.9 F (36.6 C) (!) 97.5 F (36.4 C)  TempSrc: Oral Oral Oral Oral  SpO2: 95% 93% 94% 93%  Weight:   126.8 kg   Height:        GEN- The patient is well appearing, alert and oriented x 3 today.   HEENT: normocephalic, atraumatic; sclera clear, conjunctiva pink; hearing intact; oropharynx clear; neck supple Lungs- Clear to ausculation bilaterally, normal work of breathing.  No wheezes, rales, rhonchi Heart- Rapid and regular rate and rhythm, no murmurs, rubs or gallops GI- Obese, soft, non-tender, non-distended, bowel sounds present Extremities- no clubbing, cyanosis, or edema; DP/PT/radial pulses 2+ bilaterally MS- no significant deformity or atrophy Skin- warm and dry, no rash or lesion Psych- euthymic mood, full affect Neuro- strength and sensation are intact  Labs:   Lab Results  Component Value Date   WBC 12.8 (H) 02/15/2021   HGB 16.3 (H)  02/16/2021   HCT 48.0 (H) 02/16/2021   MCV 81.7 02/15/2021   PLT 291 02/15/2021    Recent Labs  Lab 02/15/21 0424 02/16/21 0500 02/17/21 0447  NA 140   < > 138  K 3.4*   < > 3.9  CL 95*   < > 96*  CO2 32   < > 28  BUN 16   < > 15  CREATININE 1.04*   < > 1.14*  CALCIUM 9.2   < > 8.8*  PROT 6.5  --   --  BILITOT 1.3*  --   --   ALKPHOS 94  --   --   ALT 48*  --   --   AST 42*  --   --   GLUCOSE 152*   < > 273*   < > = values in this interval not displayed.      Radiology/Studies: CT ABDOMEN PELVIS WO CONTRAST  Result Date: 02/08/2021 CLINICAL DATA:  Abdominal distension, constipation. EXAM: CT ABDOMEN AND PELVIS WITHOUT CONTRAST TECHNIQUE: Multidetector CT imaging of the abdomen and pelvis was performed following the standard protocol without IV contrast. COMPARISON:  October 19, 2018. FINDINGS: Lower chest: No acute abnormality. Hepatobiliary: No gallstones or biliary dilatation is noted. Hepatic steatosis is noted. Pancreas: Unremarkable. No pancreatic ductal dilatation or surrounding inflammatory changes. Spleen: Normal in size without focal abnormality. Adrenals/Urinary Tract: Small nonobstructive calculus is noted in lower pole collecting system of left kidney. Adrenal glands appear normal. No hydronephrosis or renal obstruction is noted. Urinary bladder is decompressed. Stomach/Bowel: Stomach is within normal limits. Appendix appears normal. No evidence of bowel wall thickening, distention, or inflammatory changes. Vascular/Lymphatic: No significant vascular findings are present. No enlarged abdominal or pelvic lymph nodes. Reproductive: Uterus and bilateral adnexa are unremarkable. Other: No abdominal wall hernia or abnormality. No abdominopelvic ascites. Musculoskeletal: No acute or significant osseous findings. IMPRESSION: Small nonobstructive left renal calculus. No hydronephrosis or renal obstruction is noted. Hepatic steatosis. Electronically Signed   By: Marijo Conception M.D.    On: 02/08/2021 14:32   CT Angio Chest PE W/Cm &/Or Wo Cm  Result Date: 02/08/2021 CLINICAL DATA:  Dyspnea with exertion.  Positive D-dimer. EXAM: CT ANGIOGRAPHY CHEST WITH CONTRAST TECHNIQUE: Multidetector CT imaging of the chest was performed using the standard protocol during bolus administration of intravenous contrast. Multiplanar CT image reconstructions and MIPs were obtained to evaluate the vascular anatomy. CONTRAST:  171mL OMNIPAQUE IOHEXOL 350 MG/ML SOLN COMPARISON:  Radiograph same day. FINDINGS: Cardiovascular: Satisfactory opacification of the pulmonary arteries to the segmental level. No evidence of pulmonary embolism. Normal heart size. No pericardial effusion. Mediastinum/Nodes: No enlarged mediastinal, hilar, or axillary lymph nodes. Thyroid gland, trachea, and esophagus demonstrate no significant findings. Lungs/Pleura: Lungs are clear. No pleural effusion or pneumothorax. Upper Abdomen: No acute abnormality. Musculoskeletal: No chest wall abnormality. No acute or significant osseous findings. Review of the MIP images confirms the above findings. IMPRESSION: No definite evidence of pulmonary embolus. No acute abnormality seen in the chest. Electronically Signed   By: Marijo Conception M.D.   On: 02/08/2021 18:12   CARDIAC CATHETERIZATION  Result Date: 02/16/2021 1. PCWP and LVEDP remain elevated. 2. Pulmonary venous hypertension. 3. Preserved cardiac output. 4. No significant coronary disease, nonischemic cardiomyopathy.   DG Chest Portable 1 View  Result Date: 02/13/2021 CLINICAL DATA:  SOB 3 weeks, comes and goes, no headaches, no vomiting, no lightheadedness, No cough, swelling in the upper abdomen. EXAM: PORTABLE CHEST 1 VIEW COMPARISON:  02/08/2021. FINDINGS: Mild enlargement of the cardiopericardial silhouette, stable. No mediastinal or hilar masses. Vascular congestion and interstitial thickening bilaterally. No lung consolidation. No visualized pleural effusion. No pneumothorax.  Skeletal structures grossly intact. IMPRESSION: 1. Findings consistent with mild congestive heart failure with interstitial edema. Findings similar to the prior chest radiograph. Electronically Signed   By: Lajean Manes M.D.   On: 02/13/2021 12:12   DG Chest Portable 1 View  Result Date: 02/08/2021 CLINICAL DATA:  Shortness of breath and abdominal swelling. EXAM: PORTABLE CHEST 1 VIEW COMPARISON:  PA  and lateral chest 10/16/2018. FINDINGS: Lungs are clear. Heart size is normal. No pneumothorax or pleural fluid. No acute or focal bony abnormality. IMPRESSION: No acute disease. Electronically Signed   By: Inge Rise M.D.   On: 02/08/2021 14:01   ECHOCARDIOGRAM COMPLETE  Result Date: 02/14/2021    ECHOCARDIOGRAM REPORT   Patient Name:   LACARA DUNSWORTH Date of Exam: 02/14/2021 Medical Rec #:  [id number]     Height:       60.0 in Accession #:    [id number]    Weight:       295.2 lb Date of Birth:  Jan 16, 1976      BSA:          2.202 m Patient Age:    74 years      BP:           122/79 mmHg Patient Gender: F             HR:           106 bpm. Exam Location:  Inpatient Procedure: 2D Echo, Cardiac Doppler, Color Doppler and Intracardiac            Opacification Agent  Results communicated to Dr Arby Barrette at 11:58am on 02/14/21. Indications:    R07.9* Chest pain, unspecified  History:        Patient has no prior history of Echocardiogram examinations.                 CHF, Signs/Symptoms:Shortness of Breath; Risk Factors:Morbid                 obesity. Edema.  Sonographer:    Merrie Roof RDCS Referring Phys: [id number] [place]  1. Left ventricular ejection fraction, by estimation, is 20 to 25%. The left ventricle has severely decreased function. The left ventricle demonstrates global hypokinesis. The left ventricular internal cavity size was moderately dilated. There is mild left ventricular hypertrophy. Left ventricular diastolic parameters are indeterminate.  2. Right ventricular systolic function is  moderately reduced. The right ventricular size is normal. There is mildly elevated pulmonary artery systolic pressure. The estimated right ventricular systolic pressure is 70.9 mmHg.  3. Left atrial size was mildly dilated.  4. The mitral valve is normal in structure. Trivial mitral valve regurgitation.  5. The aortic valve is tricuspid. Aortic valve regurgitation is not visualized. No aortic stenosis is present.  6. The inferior vena cava is dilated in size with >50% respiratory variability, suggesting right atrial pressure of 8 mmHg. FINDINGS  Left Ventricle: Left ventricular ejection fraction, by estimation, is 20 to 25%. The left ventricle has severely decreased function. The left ventricle demonstrates global hypokinesis. Definity contrast agent was given IV to delineate the left ventricular endocardial borders. The left ventricular internal cavity size was moderately dilated. There is mild left ventricular hypertrophy. Left ventricular diastolic parameters are indeterminate. Right Ventricle: The right ventricular size is normal. Right vetricular wall thickness was not well visualized. Right ventricular systolic function is moderately reduced. There is mildly elevated pulmonary artery systolic pressure. The tricuspid regurgitant velocity is 2.65 m/s, and with an assumed right atrial pressure of 8 mmHg, the estimated right ventricular systolic pressure is 62.8 mmHg. Left Atrium: Left atrial size was mildly dilated. Right Atrium: Right atrial size was normal in size. Pericardium: There is no evidence of pericardial effusion. Mitral Valve: The mitral valve is normal in structure. Trivial mitral valve regurgitation. Tricuspid Valve: The tricuspid valve is normal in structure. Tricuspid valve regurgitation  is trivial. Aortic Valve: The aortic valve is tricuspid. Aortic valve regurgitation is not visualized. No aortic stenosis is present. Aortic valve mean gradient measures 5.0 mmHg. Aortic valve peak gradient  measures 8.2 mmHg. Aortic valve area, by VTI measures 1.37 cm. Pulmonic Valve: The pulmonic valve was not well visualized. Pulmonic valve regurgitation is not visualized. Aorta: The aortic root and ascending aorta are structurally normal, with no evidence of dilitation. Venous: The inferior vena cava is dilated in size with greater than 50% respiratory variability, suggesting right atrial pressure of 8 mmHg. IAS/Shunts: The interatrial septum was not well visualized.  LEFT VENTRICLE PLAX 2D LVIDd:         6.10 cm LVIDs:         4.50 cm LV PW:         1.00 cm LV IVS:        0.90 cm LVOT diam:     2.00 cm LV SV:         30 LV SV Index:   13 LVOT Area:     3.14 cm  RIGHT VENTRICLE            IVC RV Basal diam:  3.80 cm    IVC diam: 2.10 cm RV S prime:     9.90 cm/s TAPSE (M-mode): 2.1 cm LEFT ATRIUM             Index       RIGHT ATRIUM           Index LA diam:        4.50 cm 2.04 cm/m  RA Area:     17.60 cm LA Vol (A2C):   82.2 ml 37.32 ml/m RA Volume:   47.70 ml  21.66 ml/m LA Vol (A4C):   90.4 ml 41.04 ml/m LA Biplane Vol: 89.8 ml 40.77 ml/m  AORTIC VALVE AV Area (Vmax):    1.32 cm AV Area (Vmean):   1.33 cm AV Area (VTI):     1.37 cm AV Vmax:           143.00 cm/s AV Vmean:          107.000 cm/s AV VTI:            0.215 m AV Peak Grad:      8.2 mmHg AV Mean Grad:      5.0 mmHg LVOT Vmax:         60.20 cm/s LVOT Vmean:        45.400 cm/s LVOT VTI:          0.094 m LVOT/AV VTI ratio: 0.44  AORTA Ao Root diam: 2.60 cm Ao Asc diam:  2.70 cm TRICUSPID VALVE TV Peak grad:   26.6 mmHg TV Vmax:        2.58 m/s TR Peak grad:   28.1 mmHg TR Vmax:        265.00 cm/s  SHUNTS Systemic VTI:  0.09 m Systemic Diam: 2.00 cm Oswaldo Milian MD Electronically signed by Oswaldo Milian MD Signature Date/Time: 02/14/2021/11:58:52 AM    Final    [place] EKG SITE RITE  Result Date: 02/14/2021 If Site Rite image not attached, placement could not be confirmed due to current cardiac rhythm.  US Abdomen Limited RUQ  (LIVER/GB)  Result Date: 02/08/2021 CLINICAL DATA:  Upper abdominal pain EXAM: ULTRASOUND ABDOMEN LIMITED RIGHT UPPER QUADRANT COMPARISON:  CT from earlier in the same day. FINDINGS: Gallbladder: Gallbladder is decompressed with wall echo shadow sign consistent with cholelithiasis. No pericholecystic  fluid is noted. Negative sonographic Murphy's sign is elicited. Common bile duct: Diameter: 7 mm. Liver: Increased in echogenicity consistent with fatty infiltration. Hypoechoic lesion is noted in the left lobe measuring 1.4 cm. This was not well appreciated on the prior CT examination. Portal vein is patent on color Doppler imaging with normal direction of blood flow towards the liver. Other: None. IMPRESSION: Changes consistent with a decompressed gallbladder full of gallstones. No other complicating factors are noted. Hypoechoic lesion within the left lobe of the liver measuring 1.4 cm. This may simply represent a complex cyst although incompletely evaluated on this exam. Short-term follow-up ultrasound in 6 months is recommended. Electronically Signed   By: Inez Catalina M.D.   On: 02/08/2021 15:56    EKG: 6/2 shows narrow complex tachycardia at 143 bpm (personally reviewed) EKG on arrival 5/30 shows sinus tachycardia at 119 bpm (personally reviewed) EKG 10/16/2018 shows NSR with atrial ectopy  TELEMETRY: Sinus tachycardia 110-120s, appears to have atrial tachycardia in 130s (personally reviewed)  Assessment/Plan: 1.  SVT -> Appears to be atrial tachycardia Appears to be atrial tachycardia in the 130s  Hopefully this Tyara Dassow continue to improve as her cardiomyopathy is corrected and she is diuresed. Unclear if this would be primary to her cardiomyopathy Weight 280 lbs, Body mass index is 54.59 kg/m which would make approach quite difficult  2. Acute systolic CHF NICM by cath this admission L/RHC with preserved cardiac output and no significant CAD Echo with LVEF 20-25% Continues to diurese per CHF  team GDMT titration ongoing cMRI pending  3. Hypothyroidism New diagnosis as well  TSH 13.416, Free T4 WNL  4. DM2 New diagnosis with HgbA1c 11.0.  Dr. Curt Bears has seen. Marckus Hanover plan medication optimization and outpatient follow up to further discuss ablation candidacy. Ideally Abrianna Sidman have a good deal of weight loss in that time frame to improve both safety and success rate of ablation.   For questions or updates, please contact [place] Please consult [email or web address] for contact info under Cardiology/STEMI.  Signed, Shirley Friar, PA-C  02/17/2021 2:22 PM   I have seen and examined this patient with Oda Kilts.  Agree with above, note added to reflect my findings.  On exam, tachycardic, regular. Patient presented to the hospital with CHF, found also to have DM2 and hypothyroidism. Has been in and out of SVT since admission. Appears to be atrial tachycardia. It is certainly possible that she has a myopathy partly due to tachycardia. Would plan for HF medication optimization and hopefully can get her off of amiodarone. She has a BMI of 54. Ideally, medical management would control her arrhythmia and allow for her to lose weight as her BMI affects safety and efficacy of ablation.  Malayasia Mirkin M. Eaden Hettinger MD 02/17/2021 3:44 PM

## 2021-02-17 NOTE — TOC Progression Note (Signed)
Transition of Care (TOC) - Progression Note  Heart Failure  Patient Details  Name: Vickie Graham MRN: [id number] Date of Birth: 1975-10-14  Transition of Care [facility]) CM/SW [place], [place] Phone Number: 02/17/2021, 12:18 PM  Clinical Narrative:    CSW spoke with the patient at bedside and provided her with information regarding the [facility] Women's support group located at the [facility] outpatient clinic. CSW also provided [name] with a PCP follow up appointment however [name] asked for CSW to cancel the appointment and that another doctor in the hospital offered to provide her primary care instead. CSW cancelled the hospital follow up appointment for [name] per her request.  TOC to continue to follow for discharge plans.    Expected Discharge Plan: Home/Self Care Barriers to Discharge: Continued Medical Work up  Expected Discharge Plan and Services Expected Discharge Plan: Home/Self Care In-house Referral: Clinical Social Work     Living arrangements for the past 2 months: Apartment                                       Social Determinants of Health (SDOH) Interventions Food Insecurity Interventions: Intervention Not Indicated Financial Strain Interventions: Intervention Not Indicated Housing Interventions: Intervention Not Indicated Transportation Interventions: Intervention Not Indicated  Readmission Risk Interventions No flowsheet data found.  Anuoluwapo Mefferd, MSW, [place] Heart Failure Social Worker

## 2021-02-17 NOTE — TOC Benefit Eligibility Note (Signed)
Patient Teacher, English as a foreign language completed.    The patient is currently admitted and upon discharge could be taking Trulicity 3.12 OF/1.8 ml.  Prior Authorization Required   The patient is currently admitted and upon discharge could be taking Ozempic 0.25 mg.  Prior Authorization Required   The patient is insured through [place]  The patient has Pharmacist, community, would encourage  Lyndel Safe, [place] Patient Advocate Specialist [place] Team Direct Number: [phone]  Fax: [phone]

## 2021-02-18 DIAGNOSIS — I471 Supraventricular tachycardia: Secondary | ICD-10-CM

## 2021-02-18 LAB — MAGNESIUM: Magnesium: 2.2 mg/dL (ref 1.7–2.4)

## 2021-02-18 LAB — GLUCOSE, CAPILLARY
Glucose-Capillary: 175 mg/dL — ABNORMAL HIGH (ref 70–99)
Glucose-Capillary: 216 mg/dL — ABNORMAL HIGH (ref 70–99)
Glucose-Capillary: 220 mg/dL — ABNORMAL HIGH (ref 70–99)
Glucose-Capillary: 234 mg/dL — ABNORMAL HIGH (ref 70–99)

## 2021-02-18 LAB — BASIC METABOLIC PANEL
Anion gap: 14 (ref 5–15)
BUN: 24 mg/dL — ABNORMAL HIGH (ref 6–20)
CO2: 25 mmol/L (ref 22–32)
Calcium: 9.2 mg/dL (ref 8.9–10.3)
Chloride: 99 mmol/L (ref 98–111)
Creatinine, Ser: 1.43 mg/dL — ABNORMAL HIGH (ref 0.44–1.00)
GFR, Estimated: 46 mL/min — ABNORMAL LOW (ref >60)
Glucose, Bld: 230 mg/dL — ABNORMAL HIGH (ref 70–99)
Potassium: 4 mmol/L (ref 3.5–5.1)
Sodium: 138 mmol/L (ref 135–145)

## 2021-02-18 LAB — COOXEMETRY PANEL
Carboxyhemoglobin: 1.6 % — ABNORMAL HIGH (ref 0.5–1.5)
Methemoglobin: 0.7 % (ref 0.0–1.5)
O2 Saturation: 63.2 %
Total hemoglobin: 16.2 g/dL — ABNORMAL HIGH (ref 12.0–16.0)

## 2021-02-18 MED ORDER — METOPROLOL SUCCINATE ER 25 MG PO TB24
12.5000 mg | ORAL_TABLET | Freq: Once | ORAL | Status: AC
  Administered 2021-02-18: 12.5 mg via ORAL
  Filled 2021-02-18: qty 1

## 2021-02-18 MED ORDER — ALTEPLASE 2 MG IJ SOLR
2.0000 mg | Freq: Once | INTRAMUSCULAR | Status: AC
Start: 2021-02-18 — End: 2021-02-18
  Administered 2021-02-18: 2 mg

## 2021-02-18 MED ORDER — METOPROLOL SUCCINATE ER 25 MG PO TB24
25.0000 mg | ORAL_TABLET | Freq: Every day | ORAL | Status: DC
  Administered 2021-02-19 – 2021-02-20 (×2): 25 mg via ORAL
  Filled 2021-02-18 (×2): qty 1

## 2021-02-18 NOTE — Progress Notes (Signed)
Patient Morning lab was draw by Lab Tech.

## 2021-02-18 NOTE — Progress Notes (Signed)
[place] drawn and CMP has resulted.

## 2021-02-18 NOTE — Plan of Care (Addendum)
  Problem: Education: Goal: Knowledge of General Education information will improve Description: Including pain rating scale, medication(s)/side effects and non-pharmacologic comfort measures Outcome: Progressing   Problem: Health Behavior/Discharge Planning: Goal: Ability to manage health-related needs will improve Outcome: Progressing   Problem: Clinical Measurements: Goal: Ability to maintain clinical measurements within normal limits will improve Outcome: Progressing Goal: Will remain free from infection Outcome: Progressing Goal: Diagnostic test results will improve Outcome: Progressing Held IV Lasix Per MD orders Goal: Respiratory complications will improve Outcome: Progressing Goal: Cardiovascular complication will be avoided Outcome: Progressing   Problem: Activity: Goal: Risk for activity intolerance will decrease Outcome: Progressing   Problem: Nutrition: Goal: Adequate nutrition will be maintained Outcome: Progressing   Problem: Coping: Goal: Level of anxiety will decrease Outcome: Progressing   Problem: Elimination: Goal: Will not experience complications related to bowel motility Outcome: Progressing Goal: Will not experience complications related to urinary retention Outcome: Progressing   Problem: Pain Managment: Goal: General experience of comfort will improve Outcome: Progressing   Problem: Safety: Goal: Ability to remain free from injury will improve Outcome: Progressing   Problem: Skin Integrity: Goal: Risk for impaired skin integrity will decrease Outcome: Progressing   Problem: Education: Goal: Ability to demonstrate management of disease process will improve Outcome: Progressing Goal: Ability to verbalize understanding of medication therapies will improve Outcome: Progressing Goal: Individualized Educational Video(s) Outcome: Progressing   Problem: Activity: Goal: Capacity to carry out activities will improve Outcome: Progressing    Problem: Cardiac: Goal: Ability to achieve and maintain adequate cardiopulmonary perfusion will improve Outcome: Progressing   Problem: Education: Goal: Knowledge of General Education information will improve Description: Including pain rating scale, medication(s)/side effects and non-pharmacologic comfort measures Outcome: Progressing   Problem: Health Behavior/Discharge Planning: Goal: Ability to manage health-related needs will improve Outcome: Progressing   Problem: Clinical Measurements: Goal: Ability to maintain clinical measurements within normal limits will improve Outcome: Progressing Goal: Will remain free from infection Outcome: Progressing Goal: Diagnostic test results will improve Outcome: Progressing Goal: Respiratory complications will improve Outcome: Progressing Goal: Cardiovascular complication will be avoided Outcome: Progressing   Problem: Activity: Goal: Risk for activity intolerance will decrease Outcome: Progressing   Problem: Nutrition: Goal: Adequate nutrition will be maintained Outcome: Progressing   Problem: Coping: Goal: Level of anxiety will decrease Outcome: Progressing   Problem: Elimination: Goal: Will not experience complications related to bowel motility Outcome: Progressing Goal: Will not experience complications related to urinary retention Outcome: Progressing   Problem: Pain Managment: Goal: General experience of comfort will improve Outcome: Progressing   Problem: Safety: Goal: Ability to remain free from injury will improve Outcome: Progressing   Problem: Skin Integrity: Goal: Risk for impaired skin integrity will decrease Outcome: Progressing

## 2021-02-18 NOTE — Progress Notes (Signed)
RN unable to draw AM labs due to lack of blood return from PICC. Both lumens troubleshooted. IV team Cx placed.

## 2021-02-18 NOTE — Progress Notes (Addendum)
Family Medicine Teaching Service Daily Progress Note Intern Pager: [phone]  Patient name: Vickie Graham Medical record number: [id number] Date of birth: Jul 03, 1976 Age: 45 y.o. Gender: female  Primary Care Provider: Alcus Dad, MD Consultants: Cardiology Code Status: Full  Pt Overview and Major Events to Date:  5/30: admitted 5/31: PICC placed in RUE 6/2: right & left heart cath  Assessment and Plan:  Vickie Graham is a 45yo female presenting withnew onset systolic heart failure. PMH significant for obesity.  New Onset HFrEF Patient continues to feel well overall. Cardiac MRI yesterday with EF 21% and diffuse hypokinesis. Diuresis slowing a bit. 1.6L UOP over past 24h, net negative 11.7L since admission. Current weight 125kg (-11kg from admission). Am BMP not yet collected due to issues with PICC. -Cardiology following, greatly appreciate their management -Diuresis per cardiology -Continue Entresto, Spironolactone, Toprol and Jardiance per cardiology -Strict I/Os, daily weights -Daily BMP -Needs outpatient sleep study  Atrial Tachycardia In normal sinus rhythm this morning with HR 90-95. -Appreciate management from cardiology -Continue amio gtt for now -Outpatient cards f/u for possible ablation. Currently not a good candidate due to BMI. -Tele monitoring  T2DM Newly diagnosed, A1c 11% on admission. Am CBG 175. Received 8u Lantus and 29u SAI over past 24h. Vickie Graham 10mg  daily -Increase Lantus to 15u daily -mSSI -Plan to transition to Metformin and GLP-1 on discharge -Had long discussion about lifestyle modifications this morning. Patient is very motivated. States she did well with paleo diet in the past and wants to pursue something similar  Hypertriglyceridemia/HLD TG 383, HDL 27, VLDL 77 on admission. -Rosuvastatin 20mg  daily -If persistently elevated in the future, could consider Vascepa or fenofibrate. Would hold off on additional medication at this time.   -Lifestyle modifications  Hypothyroidism TSH 13.415, free T4 wnl at 0.78 -Repeat TSH in 6 weeks as outpatient  Obesity BMI 55. -Had long discussion about lifestyle modifications this morning. Patient is very motivated. States she did well with paleo diet in the past. -Close PCP follow-up -Tentative plan to initiate GLP-1 and Metformin on discharge   FEN/GI: carb modified/heart healthy diet PPx: Lovenox   Status is: Inpatient Remains inpatient appropriate because:IV treatments appropriate due to intensity of illness or inability to take PO   Dispo:  Patient From: Home  Planned Disposition: Home  Medically stable for discharge: No      Subjective:  No acute events overnight. Patient feels well this morning. She reports 1 episode of diarrhea yesterday and feels like she may have more this morning. Wonders if it's related to the increased Lasix dose. Otherwise has no complaints.   Objective: Temp:  [97.5 F (36.4 C)-98 F (36.7 C)] 98 F (36.7 C) (06/04 0430) Pulse Rate:  [88-98] 88 (06/04 0430) Resp:  [18-19] 18 (06/04 0430) BP: (106-113)/(68-81) 109/72 (06/04 0430) SpO2:  [92 %-96 %] 96 % (06/04 0430) Weight:  [125.1 kg] 125.1 kg (06/04 0430) Physical Exam: General: alert, well-appearing, NAD Cardiovascular: RRR, normal S1/S2 without m/r/g, no JVD appreciated Respiratory: normal WOB, lungs CTAB Abdomen: soft, nontender Extremities: no BLE edema  Laboratory: Recent Labs  Lab 02/13/21 1200 02/14/21 0337 02/15/21 0424 02/16/21 1059 02/16/21 1100  WBC 12.5* 12.1* 12.8*  --   --   HGB 13.8 13.9 14.3 16.3* 16.3*  HCT 45.4 45.7 47.3* 48.0* 48.0*  PLT 283 260 291  --   --    Recent Labs  Lab 02/13/21 1200 02/13/21 1931 02/14/21 0337 02/15/21 0424 02/16/21 0500 02/16/21 1059 02/16/21 1100 02/17/21 0447  NA 134*   < > 136 140 141 142 142 138  K 3.9   < > 3.6 3.4* 3.9 4.2 4.2 3.9  CL 103   < > 98 95* 99  --   --  96*  CO2 21*   < > 24 32 30  --   --   28  BUN 14   < > 14 16 18   --   --  15  CREATININE 0.83   < > 0.95 1.04* 1.07*  --   --  1.14*  CALCIUM 8.8*   < > 8.8* 9.2 9.2  --   --  8.8*  PROT 5.9*  --  6.0* 6.5  --   --   --   --   BILITOT 0.9  --  0.9 1.3*  --   --   --   --   ALKPHOS 89  --  88 94  --   --   --   --   ALT 57*  --  52* 48*  --   --   --   --   AST 49*  --  46* 42*  --   --   --   --   GLUCOSE 202*   < > 194* 152* 155*  --   --  273*   < > = values in this interval not displayed.    Imaging/Diagnostic Tests: MR CARDIAC MORPHOLOGY W WO CONTRAST Result Date: 02/17/2021 IMPRESSION: 1.  Mildly dilated LV with EF 21%, diffuse hypokinesis. 2.  Normal RV size and systolic function, EF 03%. 3.  No definite myocardial LGE (though difficult images). Vickie Graham Electronically Signed   By: Loralie Champagne M.D.   On: 02/17/2021 16:55     Alcus Dad, MD 02/18/2021, 6:54 AM PGY-1, [place] Intern pager: [phone], text pages welcome

## 2021-02-18 NOTE — Progress Notes (Signed)
Patient ID: Vickie Graham, female   DOB: [birth year], 45 y.o.   MRN: [id number]     Advanced Heart Failure Rounding Note  PCP-Cardiologist: Dr. Aundra Dubin ([facility])  Subjective:    Weight down, not sure I/Os accurate.  She is back in NSR on amiodarone gtt.   No CVP or labs, PICC line apparently is clotted.    Cardiac MRI:  1.  Mildly dilated LV with EF 21%, diffuse hypokinesis. 2.  Normal RV size with EF 30%. 3.  No definite myocardial LGE (though difficult images).  LHC/RHC: Coronary Findings   Diagnostic Dominance: Right  Left Main  Vessel was injected. Vessel is normal in caliber. Vessel is angiographically normal.  Left Anterior Descending  No significant coronary disease but relatively small vessel.  Left Circumflex  Vessel was injected. Vessel is normal in caliber. Vessel is angiographically normal.  Right Coronary Artery  Large vessel wraps around apex, no significant coronary disease.   Intervention   No interventions have been documented.  Right Heart  Right Heart Pressures RHC Procedural Findings: Hemodynamics (mmHg) RA mean 6 RV 47/11 PA 53/20, mean 30 PCWP mean 20 LV 105/26 AO 104/79  Oxygen saturations: PA 66% AO 91%  Cardiac Output (Fick) 5.93  Cardiac Index (Fick) 2.74  PVR 1.7 WU  Cardiac Output (Thermodilution) 6.93  Cardiac Index (Thermodilution) 3.2 PVR 1.4 WU     Objective:   Weight Range: 125.1 kg Body mass index is 53.86 kg/m.   Vital Signs:   Temp:  [97.5 F (36.4 C)-98 F (36.7 C)] 97.6 F (36.4 C) (06/04 0733) Pulse Rate:  [88-98] 89 (06/04 0733) Resp:  [18-19] 19 (06/04 0733) BP: (106-115)/(68-81) 115/68 (06/04 0733) SpO2:  [92 %-96 %] 96 % (06/04 0733) Weight:  [125.1 kg] 125.1 kg (06/04 0430) Last BM Date: 02/17/21  Weight change: Filed Weights   02/16/21 0257 02/17/21 0400 02/18/21 0430  Weight: 128.9 kg 126.8 kg 125.1 kg    Intake/Output:   Intake/Output Summary (Last 24 hours) at 02/18/2021 1022 Last data  filed at 02/18/2021 0933 Gross per 24 hour  Intake 432.83 ml  Output 1900 ml  Net -1467.17 ml      Physical Exam    General: NAD, obese.  Neck: Thick, JVP difficult but appears elevated still, no thyromegaly or thyroid nodule.  Lungs: Clear to auscultation bilaterally with normal respiratory effort. CV: Nondisplaced PMI.  Heart regular S1/S2, no S3/S4, no murmur.  No peripheral edema.   Abdomen: Soft, nontender, no hepatosplenomegaly, no distention.  Skin: Intact without lesions or rashes.  Neurologic: Alert and oriented x 3.  Psych: Normal affect. Extremities: No clubbing or cyanosis.  HEENT: Normal.    Telemetry   NSR 80s-90s (personally reviewed)  EKG    No new EKG to review   Labs    CBC Recent Labs    02/16/21 1059 02/16/21 1100  HGB 16.3* 16.3*  HCT 48.0* 95.6*   Basic Metabolic Panel Recent Labs    02/16/21 0500 02/16/21 1059 02/16/21 1100 02/17/21 0447  NA 141   < > 142 138  K 3.9   < > 4.2 3.9  CL 99  --   --  96*  CO2 30  --   --  28  GLUCOSE 155*  --   --  273*  BUN 18  --   --  15  CREATININE 1.07*  --   --  1.14*  CALCIUM 9.2  --   --  8.8*  MG  --   --   --  2.2   < > = values in this interval not displayed.   Liver Function Tests No results for input(s): AST, ALT, ALKPHOS, BILITOT, PROT, ALBUMIN in the last 72 hours. No results for input(s): LIPASE, AMYLASE in the last 72 hours. Cardiac Enzymes No results for input(s): CKTOTAL, CKMB, CKMBINDEX, TROPONINI in the last 72 hours.  BNP: BNP (last 3 results) Recent Labs    02/13/21 1217  BNP 1,388.9*    ProBNP (last 3 results) No results for input(s): PROBNP in the last 8760 hours.   D-Dimer No results for input(s): DDIMER in the last 72 hours. Hemoglobin A1C No results for input(s): HGBA1C in the last 72 hours. Fasting Lipid Panel No results for input(s): CHOL, HDL, LDLCALC, TRIG, CHOLHDL, LDLDIRECT in the last 72 hours. Thyroid Function Tests No results for input(s): TSH,  T4TOTAL, T3FREE, THYROIDAB in the last 72 hours.  Invalid input(s): FREET3  Other results:   Imaging    MR CARDIAC MORPHOLOGY W WO CONTRAST  Result Date: 02/17/2021 CLINICAL DATA:  Cardiomyopathy of uncertain etiology EXAM: CARDIAC MRI TECHNIQUE: The patient was scanned on a 1.5 Tesla GE magnet. A dedicated cardiac coil was used. Functional imaging was done using Fiesta sequences. 2,3, and 4 chamber views were done to assess for RWMA's. Modified Simpson's rule using a short axis stack was used to calculate an ejection fraction on a dedicated work Conservation officer, nature. The patient received 8 cc of Gadavist. After 10 minutes inversion recovery sequences were used to assess for infiltration and scar tissue. FINDINGS: Limited images of the lung fields showed no gross abnormalities. Mildly dilated left ventricle with normal wall thickness. No evidence for LV noncompaction. Global hypokinesis, EF 21%. Normal right ventricular size, moderate systolic dysfunction with EF 30%. Mild left atrial enlargement, normal right atrial size. Trileaflet aortic valve, no significant regurgitation or stenosis. Probably mild mitral and tricuspid regurgitation. Delayed enhancement images are very difficult. I do not think that there is definite late gadolinium enhancement (LGE) present. Measurements: LVEDV 257 mL LVSV 54 mL LVEF 21% RVEDV 187 mL RVSV 57 mL RVEF 30% IMPRESSION: 1.  Mildly dilated LV with EF 21%, diffuse hypokinesis. 2.  Normal RV size and systolic function, EF 91%. 3.  No definite myocardial LGE (though difficult images). Curlee Bogan Electronically Signed   By: Loralie Champagne M.D.   On: 02/17/2021 16:55     Medications:     Scheduled Medications: . Chlorhexidine Gluconate Cloth  6 each Topical Daily  . empagliflozin  10 mg Oral Daily  . enoxaparin (LOVENOX) injection  60 mg Subcutaneous Q24H  . furosemide  80 mg Intravenous BID  . insulin aspart  0-15 Units Subcutaneous TID WC  . insulin  glargine  15 Units Subcutaneous Daily  . metoprolol succinate  12.5 mg Oral Daily  . rosuvastatin  20 mg Oral Daily  . sacubitril-valsartan  1 tablet Oral BID  . sodium chloride flush  10-40 mL Intracatheter Q12H  . sodium chloride flush  3 mL Intravenous Q12H  . spironolactone  25 mg Oral Daily    Infusions: . sodium chloride    . amiodarone 30 mg/hr (02/18/21 0443)    PRN Medications: sodium chloride, acetaminophen, nitroGLYCERIN, ondansetron (ZOFRAN) IV, sodium chloride flush, sodium chloride flush   Assessment/Plan   1. Acute on chronic systolic CHF:  New onset cardiomyopathy of uncertain etiology. Echo this admission with EF 20-25%, moderately decreased RV systolic function.  LHC/RHC with preserved cardiac output, no significant coronary disease (nonischemic cardiomyopathy).  Cardiac MRI with LV EF 21%, RV EF 30%, no myocardial LGE.  No ETOH, no drugs, no FH of CAD or CMP. Consider prior viral myocarditis, effects of diabetes itself, or newly-diagnosed hypothyroidism.  However, patient also has had frequent episodes of rapid atrial tachycardia in the hospital and says that her HR has often been up to 150 bpm at home on her Apple Watch. I wonder if this is not a tachycardia-mediated CMP.  Still volume overloaded today but weight coming down, CVP not able to be done due to PICC malfunction.   - Lasix 80 mg IV bid again today.    - Continue Entresto 24/26 bid.   - Continue sprionolactone 25 mg daily.  - Increase Toprol XL to 25 mg daily.  - Continue Jardiance.  - Need to maintain NSR.  2. Type 2 diabetes: New diagnosis, per primary service.  3. Hypothyroidism: New diagnosis, per primary service.  4. Hyperlipidemia: started on statin. TGs high, consider Vascepa.  5. Atrial tachycardia: Frequent episodes, recurred when amiodarone stopped.  On questioning, her HR has frequently been elevated as high as 150s at home though she does not feel palpitations.  I am concerned that she could have  a tachy-mediated CMP from AT.  Now back in NSR on amiodarone gtt.  - Continue IV amiodarone gtt for now.  - EP has seen, think that her high BMI makes ablation less likely to be successful, recommend continue amiodarone.   Loralie Champagne 02/18/2021 10:22 AM

## 2021-02-18 NOTE — Progress Notes (Addendum)
FPTS Interim Progress Note  Patient sleeping and resting comfortably.  Rounded with primary RN. Remains on Amiodarone drip 0.63mcg/min. Now in sinus rhythm.  CBG's today elevated to >300.  Received Lantus 8 units and additional 29units of Novolog.  Last CBG 177 at 2000. No concerns voiced.  No orders required.  Appreciated nightly round.  Today's Vitals   02/17/21 1620 02/17/21 1839 02/17/21 2000 02/18/21 0000  BP: 106/72 113/68 106/71 110/81  Pulse: 98  96 96  Resp: 18  18 19   Temp:   97.8 F (36.6 C) 98 F (36.7 C)  TempSrc:   Oral Oral  SpO2: 92%  95%   Weight:      Height:      PainSc:   0-No pain    Atrial Tachycardia Cardiology following and appreciate recommendations,  Continue Amiodarone drip per cards BMP/Mag in am  DM Type 2 Increase Lantus to 15 units in am  Continue to monitor CBG  Carollee Leitz, MD Family Medicine Residency

## 2021-02-18 NOTE — Progress Notes (Signed)
After declotting PICC Line CVP Readings show 2 and 3. Questioning previous Results. Will Make heart team aware.

## 2021-02-19 LAB — BASIC METABOLIC PANEL
Anion gap: 14 (ref 5–15)
BUN: 28 mg/dL — ABNORMAL HIGH (ref 6–20)
CO2: 27 mmol/L (ref 22–32)
Calcium: 9.3 mg/dL (ref 8.9–10.3)
Chloride: 94 mmol/L — ABNORMAL LOW (ref 98–111)
Creatinine, Ser: 1.34 mg/dL — ABNORMAL HIGH (ref 0.44–1.00)
GFR, Estimated: 50 mL/min — ABNORMAL LOW (ref >60)
Glucose, Bld: 225 mg/dL — ABNORMAL HIGH (ref 70–99)
Potassium: 3.4 mmol/L — ABNORMAL LOW (ref 3.5–5.1)
Sodium: 135 mmol/L (ref 135–145)

## 2021-02-19 LAB — COOXEMETRY PANEL
Carboxyhemoglobin: 1.5 % (ref 0.5–1.5)
Methemoglobin: 0.6 % (ref 0.0–1.5)
O2 Saturation: 65.3 %
Total hemoglobin: 15.8 g/dL (ref 12.0–16.0)

## 2021-02-19 LAB — GLUCOSE, CAPILLARY
Glucose-Capillary: 183 mg/dL — ABNORMAL HIGH (ref 70–99)
Glucose-Capillary: 226 mg/dL — ABNORMAL HIGH (ref 70–99)
Glucose-Capillary: 211 mg/dL — ABNORMAL HIGH (ref 70–99)
Glucose-Capillary: 268 mg/dL — ABNORMAL HIGH (ref 70–99)

## 2021-02-19 MED ORDER — FLUCONAZOLE 150 MG PO TABS
150.0000 mg | ORAL_TABLET | Freq: Once | ORAL | Status: AC
  Administered 2021-02-19: 150 mg via ORAL
  Filled 2021-02-19: qty 1

## 2021-02-19 MED ORDER — AMIODARONE HCL 200 MG PO TABS
400.0000 mg | ORAL_TABLET | Freq: Two times a day (BID) | ORAL | Status: DC
  Administered 2021-02-19 – 2021-02-20 (×3): 400 mg via ORAL
  Filled 2021-02-19 (×3): qty 2

## 2021-02-19 MED ORDER — POTASSIUM CHLORIDE CRYS ER 20 MEQ PO TBCR
40.0000 meq | EXTENDED_RELEASE_TABLET | Freq: Once | ORAL | Status: AC
  Administered 2021-02-19: 40 meq via ORAL
  Filled 2021-02-19: qty 2

## 2021-02-19 NOTE — Progress Notes (Signed)
FPTS Interim Progress Note  Patient sleeping and resting comfortably.  Rounded with primary RN. Currently in SR on monitor.  No concerns voiced.  No orders required.  Appreciated nightly round.  Today's Vitals   02/19/21 1300 02/19/21 1545 02/19/21 1911 02/19/21 2300  BP: 112/72 118/69 114/81   Pulse: 94 89    Resp: 20 19 19    Temp: 97.6 F (36.4 C) 97.8 F (36.6 C) 97.8 F (36.6 C)   TempSrc: Oral Oral Oral   SpO2: 92% 96% 95%   Weight:      Height:      PainSc:    0-No pain    Carollee Leitz, MD Family Medicine Residency

## 2021-02-19 NOTE — Progress Notes (Signed)
Family Medicine Teaching Service Daily Progress Note Intern Pager: [phone]  Patient name: TALITHA DICARLO Medical record number: [id number] Date of birth: 11-27-1975 Age: 45 y.o. Gender: female  Primary Care Provider: Alcus Dad, MD Consultants: Cardiology Code Status: Full  Pt Overview and Major Events to Date:  5/30: admitted 5/31: PICC placed in RUE 6/2: right & left heart cath  Assessment and Plan:  Tumeka Chimenti is a 45yo female presenting withnew onset systolic heart failure. PMH significant for obesity.  New Onset HFrEF Patient continues to feel well overall. Creatinine slightly elevated at 1.34 this morning and diuresis slowing a bit. 1.6L UOP over past 24h, net negative 12.5L since admission. Current weight 125kg (-11kg from admission).  -Cardiology following, greatly appreciate their management -Diuresis per cardiology -Continue Entresto, Spironolactone, Toprol and Jardiance per cardiology -Strict I/Os, daily weights -Daily BMP -Needs outpatient sleep study  AKI Mild AKI. Creatinine stable at 1.34 this morning, from 1.43 yesterday and baseline ~0.9. -Plan to hold diuresis today and start PO Lasix tomorrow per cardiology   Hypokalemia K 3.4 this morning. -Replete with 40 mEq PO potassium x1 -Daily BMP  Atrial Tachycardia In normal sinus rhythm this morning with HR 70-80s. -Appreciate management from cardiology -PO amiodarone 400mg  BID -Outpatient cards f/u for possible ablation. Currently not a good candidate due to BMI. -Tele monitoring  T2DM Newly diagnosed, A1c 11% on admission. Am CBG 183. Received 15u Lantus and 13u SAI over past 24h. -Jardiance 10mg  daily -Lantus 15u daily -mSSI -Plan to transition to Metformin and GLP-1 on discharge  Hypertriglyceridemia/HLD TG 383, HDL 27, VLDL 77 on admission. -Rosuvastatin 20mg  daily -If persistently elevated in the future, could consider Vascepa or fenofibrate. Would hold off on additional medication at this  time.   Hypothyroidism TSH 13.415, free T4 wnl at 0.78 -Repeat TSH in 6 weeks as outpatient  Obesity BMI 55. -Close PCP follow-up -Tentative plan to initiate GLP-1 and Metformin on discharge   FEN/GI: carb modified/heart healthy diet PPx: Lovenox   Status is: Inpatient Remains inpatient appropriate because:Ongoing diagnostic testing needed not appropriate for outpatient work up   Dispo:  Patient From: Home  Planned Disposition: Home  Medically stable for discharge: No      Subjective:  No acute events overnight. Patient reports some fatigue yesterday and this morning, but otherwise denies complaints.   Objective: Temp:  [97.6 F (36.4 C)-97.8 F (36.6 C)] 97.7 F (36.5 C) (06/05 0714) Pulse Rate:  [78-133] 79 (06/05 0714) Resp:  [17-21] 17 (06/05 0714) BP: (97-108)/(54-78) 108/71 (06/05 0714) SpO2:  [92 %-97 %] 92 % (06/05 0714) Weight:  [125.7 kg] 125.7 kg (06/05 0348) Physical Exam: General: alert, well-appearing, NAD Cardiovascular: RRR, normal S1/S2 without m/r/g, no JVD appreciated Respiratory: normal WOB, lungs CTAB Abdomen: soft, nontender Extremities: no BLE edema  Laboratory: Recent Labs  Lab 02/13/21 1200 02/14/21 0337 02/15/21 0424 02/16/21 1059 02/16/21 1100  WBC 12.5* 12.1* 12.8*  --   --   HGB 13.8 13.9 14.3 16.3* 16.3*  HCT 45.4 45.7 47.3* 48.0* 48.0*  PLT 283 260 291  --   --    Recent Labs  Lab 02/13/21 1200 02/13/21 1931 02/14/21 0337 02/15/21 0424 02/16/21 0500 02/17/21 0447 02/18/21 0957 02/19/21 0448  NA 134*   < > 136 140   < > 138 138 135  K 3.9   < > 3.6 3.4*   < > 3.9 4.0 3.4*  CL 103   < > 98 95*   < > 96*  99 94*  CO2 21*   < > 24 32   < > 28 25 27   BUN 14   < > 14 16   < > 15 24* 28*  CREATININE 0.83   < > 0.95 1.04*   < > 1.14* 1.43* 1.34*  CALCIUM 8.8*   < > 8.8* 9.2   < > 8.8* 9.2 9.3  PROT 5.9*  --  6.0* 6.5  --   --   --   --   BILITOT 0.9  --  0.9 1.3*  --   --   --   --   ALKPHOS 89  --  88 94  --    --   --   --   ALT 57*  --  52* 48*  --   --   --   --   AST 49*  --  46* 42*  --   --   --   --   GLUCOSE 202*   < > 194* 152*   < > 273* 230* 225*   < > = values in this interval not displayed.    Imaging/Diagnostic Tests: No new imaging/diagnostic tests over past 24h.   Alcus Dad, MD 02/19/2021, 10:11 AM PGY-1, [place] Intern pager: [phone], text pages welcome

## 2021-02-19 NOTE — Progress Notes (Signed)
Patient ID: Vickie Graham, female   DOB: September 18, 1975, 45 y.o.   MRN: [id number]     Advanced Heart Failure Rounding Note  PCP-Cardiologist: Dr. Aundra Dubin ([place])  Subjective:    She is in NSR on amiodarone gtt.   No accurate CVP, PICC not functioning.  Nurse got CVP 2-3 yesterday pm.   Cardiac MRI:  1.  Mildly dilated LV with EF 21%, diffuse hypokinesis. 2.  Normal RV size with EF 30%. 3.  No definite myocardial LGE (though difficult images).  LHC/RHC: Coronary Findings   Diagnostic Dominance: Right  Left Main  Vessel was injected. Vessel is normal in caliber. Vessel is angiographically normal.  Left Anterior Descending  No significant coronary disease but relatively small vessel.  Left Circumflex  Vessel was injected. Vessel is normal in caliber. Vessel is angiographically normal.  Right Coronary Artery  Large vessel wraps around apex, no significant coronary disease.   Intervention   No interventions have been documented.  Right Heart  Right Heart Pressures RHC Procedural Findings: Hemodynamics (mmHg) RA mean 6 RV 47/11 PA 53/20, mean 30 PCWP mean 20 LV 105/26 AO 104/79  Oxygen saturations: PA 66% AO 91%  Cardiac Output (Fick) 5.93  Cardiac Index (Fick) 2.74  PVR 1.7 WU  Cardiac Output (Thermodilution) 6.93  Cardiac Index (Thermodilution) 3.2 PVR 1.4 WU     Objective:   Weight Range: 125.7 kg Body mass index is 54.12 kg/m.   Vital Signs:   Temp:  [97.6 F (36.4 C)-97.8 F (36.6 C)] 97.7 F (36.5 C) (06/05 0714) Pulse Rate:  [78-133] 79 (06/05 0714) Resp:  [17-21] 17 (06/05 0714) BP: (97-108)/(54-78) 108/71 (06/05 0714) SpO2:  [92 %-97 %] 92 % (06/05 0714) Weight:  [125.7 kg] 125.7 kg (06/05 0348) Last BM Date: 02/17/21  Weight change: Filed Weights   02/17/21 0400 02/18/21 0430 02/19/21 0348  Weight: 126.8 kg 125.1 kg 125.7 kg    Intake/Output:   Intake/Output Summary (Last 24 hours) at 02/19/2021 0959 Last data filed at 02/18/2021  1900 Gross per 24 hour  Intake 768.46 ml  Output 1300 ml  Net -531.54 ml      Physical Exam    General: NAD, obese.  Neck: No JVD, no thyromegaly or thyroid nodule.  Lungs: Clear to auscultation bilaterally with normal respiratory effort. CV: Nondisplaced PMI.  Heart regular S1/S2, no S3/S4, no murmur.  No peripheral edema.   Abdomen: Soft, nontender, no hepatosplenomegaly, no distention.  Skin: Intact without lesions or rashes.  Neurologic: Alert and oriented x 3.  Psych: Normal affect. Extremities: No clubbing or cyanosis.  HEENT: Normal.    Telemetry   NSR 80s-90s (personally reviewed)  EKG    No new EKG to review   Labs    CBC Recent Labs    02/16/21 1059 02/16/21 1100  HGB 16.3* 16.3*  HCT 48.0* 32.2*   Basic Metabolic Panel Recent Labs    02/17/21 0447 02/18/21 0957 02/19/21 0448  NA 138 138 135  K 3.9 4.0 3.4*  CL 96* 99 94*  CO2 28 25 27   GLUCOSE 273* 230* 225*  BUN 15 24* 28*  CREATININE 1.14* 1.43* 1.34*  CALCIUM 8.8* 9.2 9.3  MG 2.2 2.2  --    Liver Function Tests No results for input(s): AST, ALT, ALKPHOS, BILITOT, PROT, ALBUMIN in the last 72 hours. No results for input(s): LIPASE, AMYLASE in the last 72 hours. Cardiac Enzymes No results for input(s): CKTOTAL, CKMB, CKMBINDEX, TROPONINI in the last 72 hours.  BNP: BNP (last 3 results) Recent Labs    02/13/21 1217  BNP 1,388.9*    ProBNP (last 3 results) No results for input(s): PROBNP in the last 8760 hours.   D-Dimer No results for input(s): DDIMER in the last 72 hours. Hemoglobin A1C No results for input(s): HGBA1C in the last 72 hours. Fasting Lipid Panel No results for input(s): CHOL, HDL, LDLCALC, TRIG, CHOLHDL, LDLDIRECT in the last 72 hours. Thyroid Function Tests No results for input(s): TSH, T4TOTAL, T3FREE, THYROIDAB in the last 72 hours.  Invalid input(s): FREET3  Other results:   Imaging    No results found.   Medications:     Scheduled  Medications: . amiodarone  400 mg Oral BID  . Chlorhexidine Gluconate Cloth  6 each Topical Daily  . empagliflozin  10 mg Oral Daily  . enoxaparin (LOVENOX) injection  60 mg Subcutaneous Q24H  . insulin aspart  0-15 Units Subcutaneous TID WC  . insulin glargine  15 Units Subcutaneous Daily  . metoprolol succinate  25 mg Oral Daily  . potassium chloride  40 mEq Oral Once  . rosuvastatin  20 mg Oral Daily  . sacubitril-valsartan  1 tablet Oral BID  . sodium chloride flush  10-40 mL Intracatheter Q12H  . sodium chloride flush  3 mL Intravenous Q12H  . spironolactone  25 mg Oral Daily    Infusions: . sodium chloride      PRN Medications: sodium chloride, acetaminophen, nitroGLYCERIN, ondansetron (ZOFRAN) IV, sodium chloride flush, sodium chloride flush   Assessment/Plan   1. Acute on chronic systolic CHF:  New onset cardiomyopathy of uncertain etiology. Echo this admission with EF 20-25%, moderately decreased RV systolic function.  LHC/RHC with preserved cardiac output, no significant coronary disease (nonischemic cardiomyopathy). Cardiac MRI with LV EF 21%, RV EF 30%, no myocardial LGE.  No ETOH, no drugs, no FH of CAD or CMP. Consider prior viral myocarditis, effects of diabetes itself, or newly-diagnosed hypothyroidism.  However, patient also has had frequent episodes of rapid atrial tachycardia in the hospital and says that her HR has often been up to 150 bpm at home on her Apple Watch. I wonder if this is not a tachycardia-mediated CMP.  I do not think she is volume overloaded now, unable to get CVP reading.   - No IV Lasix today, start po tomorrow.    - Continue Entresto 24/26 bid.   - Continue sprionolactone 25 mg daily.  - Continue Toprol XL 25 mg daily.  - Continue Jardiance.  - Need to maintain NSR.  2. Type 2 diabetes: New diagnosis, per primary service.  3. Hypothyroidism: New diagnosis, per primary service.  4. Hyperlipidemia: started on statin. TGs high, consider Vascepa.   5. Atrial tachycardia: Frequent episodes, recurred when amiodarone stopped.  On questioning, her HR has frequently been elevated as high as 150s at home though she does not feel palpitations.  I am concerned that she could have a tachy-mediated CMP from AT.  Now back in NSR on amiodarone gtt.  - transition to po amiodarone 400 mg bid with taper to 200 mg daily over time.  - EP has seen, think that her high BMI makes ablation less likely to be successful, recommend continue amiodarone.  6. AKI: Creatinine up to 1.43 yesterday, 1.34 today (likely over-diuresis).   Possibly home tomorrow if maintains NSR on po amiodarone.   Loralie Champagne 02/19/2021 9:59 AM

## 2021-02-20 ENCOUNTER — Other Ambulatory Visit (HOSPITAL_COMMUNITY): Payer: Self-pay

## 2021-02-20 LAB — BASIC METABOLIC PANEL
Anion gap: 12 (ref 5–15)
BUN: 28 mg/dL — ABNORMAL HIGH (ref 6–20)
CO2: 26 mmol/L (ref 22–32)
Calcium: 9.1 mg/dL (ref 8.9–10.3)
Chloride: 100 mmol/L (ref 98–111)
Creatinine, Ser: 1.32 mg/dL — ABNORMAL HIGH (ref 0.44–1.00)
GFR, Estimated: 51 mL/min — ABNORMAL LOW (ref >60)
Glucose, Bld: 168 mg/dL — ABNORMAL HIGH (ref 70–99)
Potassium: 4 mmol/L (ref 3.5–5.1)
Sodium: 138 mmol/L (ref 135–145)

## 2021-02-20 LAB — GLUCOSE, CAPILLARY
Glucose-Capillary: 166 mg/dL — ABNORMAL HIGH (ref 70–99)
Glucose-Capillary: 200 mg/dL — ABNORMAL HIGH (ref 70–99)

## 2021-02-20 LAB — COOXEMETRY PANEL
Carboxyhemoglobin: 1.5 % (ref 0.5–1.5)
Methemoglobin: 0.6 % (ref 0.0–1.5)
O2 Saturation: 61.6 %
Total hemoglobin: 15.8 g/dL (ref 12.0–16.0)

## 2021-02-20 LAB — MAGNESIUM: Magnesium: 2.3 mg/dL (ref 1.7–2.4)

## 2021-02-20 MED ORDER — METOPROLOL SUCCINATE ER 25 MG PO TB24
25.0000 mg | ORAL_TABLET | Freq: Every day | ORAL | 0 refills | Status: DC
  Filled 2021-02-20: qty 30, 30d supply, fill #0

## 2021-02-20 MED ORDER — EMPAGLIFLOZIN 10 MG PO TABS
10.0000 mg | ORAL_TABLET | Freq: Every day | ORAL | 0 refills | Status: DC
  Filled 2021-02-20: qty 30, 30d supply, fill #0

## 2021-02-20 MED ORDER — OZEMPIC (0.25 OR 0.5 MG/DOSE) 2 MG/1.5ML ~~LOC~~ SOPN
0.2500 mg | PEN_INJECTOR | SUBCUTANEOUS | 0 refills | Status: DC
  Filled 2021-02-20: qty 1.5, 56d supply, fill #0

## 2021-02-20 MED ORDER — SACUBITRIL-VALSARTAN 24-26 MG PO TABS
1.0000 | ORAL_TABLET | Freq: Two times a day (BID) | ORAL | 0 refills | Status: DC
  Filled 2021-02-20: qty 60, 30d supply, fill #0

## 2021-02-20 MED ORDER — OZEMPIC (0.25 OR 0.5 MG/DOSE) 2 MG/1.5ML ~~LOC~~ SOPN: 0.2500 mg | PEN_INJECTOR | SUBCUTANEOUS | 0 refills | Status: DC

## 2021-02-20 MED ORDER — POTASSIUM CHLORIDE CRYS ER 20 MEQ PO TBCR
20.0000 meq | EXTENDED_RELEASE_TABLET | Freq: Every day | ORAL | 0 refills | Status: DC
  Filled 2021-02-20: qty 30, 30d supply, fill #0

## 2021-02-20 MED ORDER — ROSUVASTATIN CALCIUM 20 MG PO TABS
20.0000 mg | ORAL_TABLET | Freq: Every day | ORAL | 0 refills | Status: DC
  Filled 2021-02-20: qty 30, 30d supply, fill #0
  Filled 2021-03-12: qty 30, 30d supply, fill #1

## 2021-02-20 MED ORDER — ACETAMINOPHEN 325 MG PO TABS: 650.0000 mg | ORAL_TABLET | Freq: Four times a day (QID) | ORAL | Status: AC | PRN

## 2021-02-20 MED ORDER — SPIRONOLACTONE 25 MG PO TABS
25.0000 mg | ORAL_TABLET | Freq: Every day | ORAL | 0 refills | Status: DC
  Filled 2021-02-20: qty 30, 30d supply, fill #0

## 2021-02-20 MED ORDER — AMIODARONE HCL 200 MG PO TABS
ORAL_TABLET | ORAL | 0 refills | Status: DC
  Filled 2021-02-20: qty 102, 74d supply, fill #0

## 2021-02-20 MED ORDER — FUROSEMIDE 40 MG PO TABS
40.0000 mg | ORAL_TABLET | Freq: Every day | ORAL | 0 refills | Status: DC
  Filled 2021-02-20: qty 30, 30d supply, fill #0

## 2021-02-20 MED ORDER — METFORMIN HCL ER 500 MG PO TB24
500.0000 mg | ORAL_TABLET | Freq: Every day | ORAL | 0 refills | Status: DC
  Filled 2021-02-20: qty 30, 30d supply, fill #0

## 2021-02-20 NOTE — Plan of Care (Signed)

## 2021-02-20 NOTE — Progress Notes (Addendum)
Patient ID: Vickie Graham, female   DOB: 1976-05-28, 45 y.o.   MRN: [id number]     Advanced Heart Failure Rounding Note  PCP-Cardiologist: Dr. Aundra Dubin ([place])  Subjective:    Volume status good. Wt stable off diuretics. CVP 6. SCr 1.4>>1.3.   NSR w/ PACs on tele, HR 80s.   Feels well. No complaints.    Cardiac MRI:  1.  Mildly dilated LV with EF 21%, diffuse hypokinesis. 2.  Normal RV size with EF 30%. 3.  No definite myocardial LGE (though difficult images).  LHC/RHC: Coronary Findings   Diagnostic Dominance: Right  Left Main  Vessel was injected. Vessel is normal in caliber. Vessel is angiographically normal.  Left Anterior Descending  No significant coronary disease but relatively small vessel.  Left Circumflex  Vessel was injected. Vessel is normal in caliber. Vessel is angiographically normal.  Right Coronary Artery  Large vessel wraps around apex, no significant coronary disease.   Intervention   No interventions have been documented.  Right Heart  Right Heart Pressures RHC Procedural Findings: Hemodynamics (mmHg) RA mean 6 RV 47/11 PA 53/20, mean 30 PCWP mean 20 LV 105/26 AO 104/79  Oxygen saturations: PA 66% AO 91%  Cardiac Output (Fick) 5.93  Cardiac Index (Fick) 2.74  PVR 1.7 WU  Cardiac Output (Thermodilution) 6.93  Cardiac Index (Thermodilution) 3.2 PVR 1.4 WU     Objective:   Weight Range: 125.6 kg Body mass index is 54.1 kg/m.   Vital Signs:   Temp:  [97.6 F (36.4 C)-98 F (36.7 C)] 98 F (36.7 C) (06/06 0315) Pulse Rate:  [79-94] 89 (06/05 1545) Resp:  [14-20] 14 (06/06 0315) BP: (108-118)/(69-86) 110/86 (06/06 0315) SpO2:  [92 %-97 %] 97 % (06/06 0315) Weight:  [125.6 kg] 125.6 kg (06/06 0315) Last BM Date: 02/19/21  Weight change: Filed Weights   02/18/21 0430 02/19/21 0348 02/20/21 0315  Weight: 125.1 kg 125.7 kg 125.6 kg    Intake/Output:   Intake/Output Summary (Last 24 hours) at 02/20/2021 0709 Last data filed  at 02/19/2021 2300 Gross per 24 hour  Intake 480 ml  Output 300 ml  Net 180 ml      Physical Exam    PHYSICAL EXAM: CVP 6  General:  Well appearing, obese. No respiratory difficulty HEENT: normal Neck: supple. JVD not elevated. Carotids 2+ bilat; no bruits. No lymphadenopathy or thyromegaly appreciated. Cor: PMI nondisplaced. Regular rate & rhythm. No rubs, gallops or murmurs. Lungs: clear Abdomen: obese soft, nontender, nondistended. No hepatosplenomegaly. No bruits or masses. Good bowel sounds. Extremities: no cyanosis, clubbing, rash, edema Neuro: alert & oriented x 3, cranial nerves grossly intact. moves all 4 extremities w/o difficulty. Affect pleasant.    Telemetry   NSR w/ PACs 80s (personally reviewed)  EKG    No new EKG to review   Labs    CBC No results for input(s): WBC, NEUTROABS, HGB, HCT, MCV, PLT in the last 72 hours. Basic Metabolic Panel Recent Labs    02/18/21 0957 02/19/21 0448 02/20/21 0345  NA 138 135 138  K 4.0 3.4* 4.0  CL 99 94* 100  CO2 25 27 26   GLUCOSE 230* 225* 168*  BUN 24* 28* 28*  CREATININE 1.43* 1.34* 1.32*  CALCIUM 9.2 9.3 9.1  MG 2.2  --  2.3   Liver Function Tests No results for input(s): AST, ALT, ALKPHOS, BILITOT, PROT, ALBUMIN in the last 72 hours. No results for input(s): LIPASE, AMYLASE in the last 72 hours. Cardiac Enzymes No  results for input(s): CKTOTAL, CKMB, CKMBINDEX, TROPONINI in the last 72 hours.  BNP: BNP (last 3 results) Recent Labs    02/13/21 1217  BNP 1,388.9*    ProBNP (last 3 results) No results for input(s): PROBNP in the last 8760 hours.   D-Dimer No results for input(s): DDIMER in the last 72 hours. Hemoglobin A1C No results for input(s): HGBA1C in the last 72 hours. Fasting Lipid Panel No results for input(s): CHOL, HDL, LDLCALC, TRIG, CHOLHDL, LDLDIRECT in the last 72 hours. Thyroid Function Tests No results for input(s): TSH, T4TOTAL, T3FREE, THYROIDAB in the last 72  hours.  Invalid input(s): FREET3  Other results:   Imaging    No results found.   Medications:     Scheduled Medications: . amiodarone  400 mg Oral BID  . Chlorhexidine Gluconate Cloth  6 each Topical Daily  . empagliflozin  10 mg Oral Daily  . enoxaparin (LOVENOX) injection  60 mg Subcutaneous Q24H  . insulin aspart  0-15 Units Subcutaneous TID WC  . insulin glargine  15 Units Subcutaneous Daily  . metoprolol succinate  25 mg Oral Daily  . rosuvastatin  20 mg Oral Daily  . sacubitril-valsartan  1 tablet Oral BID  . sodium chloride flush  10-40 mL Intracatheter Q12H  . sodium chloride flush  3 mL Intravenous Q12H  . spironolactone  25 mg Oral Daily    Infusions: . sodium chloride      PRN Medications: sodium chloride, acetaminophen, nitroGLYCERIN, ondansetron (ZOFRAN) IV, sodium chloride flush, sodium chloride flush   Assessment/Plan   1. Acute on chronic systolic CHF:  New onset cardiomyopathy of uncertain etiology. Echo this admission with EF 20-25%, moderately decreased RV systolic function.  LHC/RHC with preserved cardiac output, no significant coronary disease (nonischemic cardiomyopathy). Cardiac MRI with LV EF 21%, RV EF 30%, no myocardial LGE.  No ETOH, no drugs, no FH of CAD or CMP. Consider prior viral myocarditis, effects of diabetes itself, or newly-diagnosed hypothyroidism.  However, patient also has had frequent episodes of rapid atrial tachycardia in the hospital and says that her HR has often been up to 150 bpm at home on her Apple Watch. I wonder if this is not a tachycardia-mediated CMP.  Volume status good. CVP 6  - Start PO lasix, 40 mg daily  - Continue Entresto 24/26 bid.   - Continue sprionolactone 25 mg daily.  - Continue Toprol XL 25 mg daily.  - Continue Jardiance 10 mg daily  - Need to maintain NSR.  2. Type 2 diabetes: New diagnosis, per primary service.  3. Hypothyroidism: New diagnosis, per primary service.  4. Hyperlipidemia: started on  statin. TGs high, consider Vascepa.  5. Atrial tachycardia: Frequent episodes, recurred when amiodarone stopped.  On questioning, her HR has frequently been elevated as high as 150s at home though she does not feel palpitations.  I am concerned that she could have a tachy-mediated CMP from AT.  Now back in NSR on amiodarone gtt.  - home on po amiodarone 400 mg bid with taper to 200 mg daily over time.  - EP has seen, think that her high BMI makes ablation less likely to be successful, recommend continue amiodarone.  6. AKI: Creatinine up to 1.43, down to 1.32 today (likely over-diuresis).  - resume PO diuretics today.   [place] for d/c home today from HF standpoint.   Cardiac Meds for Discharge  Amiodarone 400 mg bid x 1 week then 200 mg bid x 1 week then 200  mg daily after that.  Jardiance 10 mg daily  Toprol XL 25 mg daily  Crestor 20 mg daily  Entresto 24-26 mg bid  Spironolactone 25 mg daily  Lasix 40 mg daily  KCl 20 mEq daily   Will arrange post hospital f/u in the [facility] and will place appt info in AVS     [place], PA-C  02/20/2021 7:09 AM  Patient seen with PA, agree with the above note.   She has had some brief AT runs but primarily remains in NSR now.  Feels good, no complaints.  Looks euvolemic.   She can go home today on the above medications.  Followup in CHF clinic.   Loralie Champagne 02/20/2021 8:12 AM

## 2021-02-20 NOTE — Discharge Summary (Signed)
[facility] Discharge Summary  Patient name: Vickie Graham Medical record number: [id number] Date of birth: 07-06-1976 Age: 45 y.o. Gender: female Date of Admission: 02/13/2021  Date of Discharge: 02/20/2021 Admitting Physician: Martyn Malay, MD  Primary Care Provider: Alcus Dad, MD Consultants: Cardiology  Indication for Hospitalization: New onset HFrEF  Discharge Diagnoses/Problem List:  HFrEF T2DM Atrial Tachycardia Dyslipidemia Hypothyroidism Obesity  Disposition: Home  Discharge Condition: Improved, stable  Discharge Exam:  Gen: alert, well-appearing, NAD HEENT: moist mucous membranes CV: RRR, intermittently tachycardic up to 130, normal S1/S2 without m/r/g Resp: normal effort, lungs CTAB Ext: WWP, no peripheral edema  Brief Hospital Course:  Vickie Graham is a 45yo female who presented with dyspnea. PMH significant for obesity.   New onset HFrEF Patient presented with worsening dyspnea on exertion, LE edema, and upper abdominal pressure. She was found to have new onset HFrEF with echo demonstrating EF 20-25% and global LV hypokinesis. BNP 1388.9. Right and left heart cath without significant coronary disease. Cardiac MRI showed EF 21% and diffuse hypokinesis. Cardiology followed the patient throughout her hospitalization. She diuresed well and was started on GDMT (Entresto, Toprol, Spironolactone, Jardiance). Dry weight at discharge 277lbs. Etiology of her CHF unclear- may be related to her atrial tachycardia or perhaps her diabetes. There was also question of possible prior viral myocarditis. Less likely related to her hypothyroidism.  Atrial Tachycardia Patient intermittently in atrial tachycardia. She was treated with amiodarone gtt and transitioned to PO amio at discharge. EP consulted and recommended medical management. Could consider ablation in future but currently poor candidate due to BMI.  Type 2 Diabetes Patient with newly  diagnosed T2DM. A1c 11%. While admitted she was maintained on insulin (lantus and SSI) as well as Jardiance 10mg  daily. At discharge she was started on Metformin and Ozempic (in addition to the [place]).  Hypothyroidism  TSH 13.415, free T4 0.78. Recommend repeat TSH in 6 weeks.  Hypertriglyceridemia/ Hyperlipidemia  Triglycerides 383. VLDL 77, HDL 27. Encouraged exercise and weight loss, as well as started patient on Crestor 20mg . If persistently elevated in the future could consider Vascepa.   Issues for Follow Up:  1. Needs outpatient sleep study 2. Repeat TSH mid-July 2022 3. Ongoing weight management counseling  Significant Procedures:  6/2: Right & Left Heart Cath  Significant Labs and Imaging:  Recent Labs  Lab 02/13/21 1200 02/14/21 0337 02/15/21 0424 02/16/21 1059 02/16/21 1100  WBC 12.5* 12.1* 12.8*  --   --   HGB 13.8 13.9 14.3 16.3* 16.3*  HCT 45.4 45.7 47.3* 48.0* 48.0*  PLT 283 260 291  --   --    Recent Labs  Lab 02/13/21 1200 02/13/21 1931 02/14/21 0337 02/15/21 0424 02/16/21 0500 02/16/21 1059 02/16/21 1100 02/17/21 0447 02/18/21 0957 02/19/21 0448 02/20/21 0345  NA 134*   < > 136 140 141   < > 142 138 138 135 138  K 3.9   < > 3.6 3.4* 3.9   < > 4.2 3.9 4.0 3.4* 4.0  CL 103   < > 98 95* 99  --   --  96* 99 94* 100  CO2 21*   < > 24 32 30  --   --  28 25 27 26   GLUCOSE 202*   < > 194* 152* 155*  --   --  273* 230* 225* 168*  BUN 14   < > 14 16 18   --   --  15 24* 28* 28*  CREATININE 0.83   < > 0.95 1.04* 1.07*  --   --  1.14* 1.43* 1.34* 1.32*  CALCIUM 8.8*   < > 8.8* 9.2 9.2  --   --  8.8* 9.2 9.3 9.1  MG  --   --   --  1.8  --   --   --  2.2 2.2  --  2.3  ALKPHOS 89  --  88 94  --   --   --   --   --   --   --   AST 49*  --  46* 42*  --   --   --   --   --   --   --   ALT 57*  --  52* 48*  --   --   --   --   --   --   --   ALBUMIN 3.0*  --  3.1* 3.2*  --   --   --   --   --   --   --    < > = values in this interval not displayed.     Results/Tests Pending at Time of Discharge: None  Discharge Medications:  Allergies as of 02/20/2021      Reactions   Sulfa Antibiotics       Medication List    TAKE these medications   acetaminophen 325 MG tablet Commonly known as: TYLENOL Take 2 tablets (650 mg total) by mouth every 6 (six) hours as needed for mild pain.   amiodarone 200 MG tablet Commonly known as: Pacerone Take 2 tablets (400 mg total) by mouth 2 (two) times daily for 7 days, THEN 1 tablet (200 mg total) 2 (two) times daily for 7 days, THEN 1 tablet (200 mg total) daily. Start taking on: February 20, 2021   empagliflozin 10 MG Tabs tablet Commonly known as: JARDIANCE Take 1 tablet (10 mg total) by mouth daily.   furosemide 40 MG tablet Commonly known as: Lasix Take 1 tablet (40 mg total) by mouth daily.   metFORMIN 500 MG 24 hr tablet Commonly known as: Glucophage XR Take 1 tablet (500 mg total) by mouth daily with breakfast.   metoprolol succinate 25 MG 24 hr tablet Commonly known as: TOPROL-XL Take 1 tablet (25 mg total) by mouth daily.   Ozempic (0.25 or 0.5 MG/DOSE) 2 MG/1.5ML Sopn Generic drug: Semaglutide(0.25 or 0.5MG /DOS) Inject 0.25 mg into the skin once a week.   pantoprazole 40 MG tablet Commonly known as: PROTONIX Take 1 tablet (40 mg total) by mouth daily.   potassium chloride SA 20 MEQ tablet Commonly known as: KLOR-CON Take 1 tablet (20 mEq total) by mouth daily.   rosuvastatin 20 MG tablet Commonly known as: CRESTOR Take 1 tablet (20 mg total) by mouth daily.   sacubitril-valsartan 24-26 MG Commonly known as: ENTRESTO Take 1 tablet by mouth 2 (two) times daily.   spironolactone 25 MG tablet Commonly known as: ALDACTONE Take 1 tablet (25 mg total) by mouth daily.   sucralfate 1 g tablet Commonly known as: Carafate Take 1 tablet (1 g total) by mouth 4 (four) times daily -  with meals and at bedtime. Dissolve 1 tablet into a glass of water and drink       Discharge  Instructions: Please refer to Patient Instructions section of EMR for full details.  Patient was counseled important signs and symptoms that should prompt return to medical care, changes in medications, dietary instructions, activity restrictions,  and follow up appointments.   Follow-Up Appointments:  Follow-up Information    [facility] Follow up.   Specialty: Cardiology Why: March 13, 2021 at 10:30 am at [facility] at [facility], [place] Code [phone] Contact information: [address] [id number] [place]       Alcus Dad, MD. Go on 03/02/2021.   Specialty: Family Medicine Why: @3 :45pm Contact information: [place] [address] [phone]               Alcus Dad, MD 02/20/2021, 11:46 AM PGY-1, [place]

## 2021-02-20 NOTE — TOC Transition Note (Signed)
Transition of Care [facility]) - CM/SW Discharge Note Heart Failure   Patient Details  Name: Vickie Graham MRN: [id number] Date of Birth: 1976/07/07  [facility]) CM/SW Contact:  [place], [place] Phone Number: 02/20/2021, 11:49 AM   Clinical Narrative:    CSW spoke with the patient at bedside to bring them an appointment card for the [facility] outpatient clinic and encouraged them to follow up and to attend the appointment and bring their medications and if anything changes to please reach out so that CSW/HV clinic team can provide support. The patient reported that she does have transportation home for discharge.  CSW will sign off for now as social work intervention is no longer needed. Please consult [place] again if new needs arise.   Final next level of care: Home/Self Care Barriers to Discharge: No Barriers Identified   Patient Goals and CMS Choice        Discharge Placement                       Discharge Plan and Services In-house Referral: Clinical Social Work                                   Social Determinants of Health (SDOH) Interventions Food Insecurity Interventions: Intervention Not Indicated Financial Strain Interventions: Intervention Not Indicated Housing Interventions: Intervention Not Indicated Transportation Interventions: Intervention Not Indicated   Readmission Risk Interventions No flowsheet data found.  Vickie Graham, MSW, [place] Heart Failure Social Worker

## 2021-02-20 NOTE — Progress Notes (Signed)
D/C instructions given and reviewed. PICC line in place, will ask primary RN about removal.

## 2021-02-22 ENCOUNTER — Encounter: Payer: Self-pay | Admitting: Emergency Medicine

## 2021-02-22 ENCOUNTER — Other Ambulatory Visit: Payer: Self-pay

## 2021-02-22 ENCOUNTER — Ambulatory Visit
Admission: EM | Admit: 2021-02-22 | Discharge: 2021-02-22 | Disposition: A | Discharge status: Home / Self Care | Payer: Medicaid Other | Attending: Student | Admitting: Student

## 2021-02-22 DIAGNOSIS — J0111 Acute recurrent frontal sinusitis: Secondary | ICD-10-CM | POA: Diagnosis not present

## 2021-02-22 DIAGNOSIS — J301 Allergic rhinitis due to pollen: Secondary | ICD-10-CM | POA: Diagnosis not present

## 2021-02-22 MED ORDER — DOXYCYCLINE HYCLATE 100 MG PO CAPS: 100.0000 mg | ORAL_CAPSULE | Freq: Two times a day (BID) | ORAL | 0 refills | Status: AC

## 2021-02-22 NOTE — Discharge Instructions (Addendum)
-  Start the antibiotic, doxycycline twice daily for 7 days.  Make sure to wear sunscreen while spending long periods of time outside while on this medication as it can increase your chance of sunburn.  Try taking this about 1 hour after taking your other medications. -Seek additional medical attention if your symptoms worsen or persist, or new symptoms like chest pain, shortness of breath, dizziness, weakness. -Continue claritin for allergic rhinitis component.

## 2021-02-22 NOTE — ED Provider Notes (Signed)
[facility]    CSN: [id number] Arrival date & time: 02/22/21  1219      History   Chief Complaint Chief Complaint  Patient presents with  . Facial Pain    HPI Vickie Graham is a 45 y.o. female presenting with facial pain. Medical history allergic rhinitis currently fairly well controlled on claritin. Negative covid test 1 week ago. States she was hospitalized for CHF one week ago but is doing much better today, denies chest pain, shortness of breath, dizziness, weakness.  Endorses about 10 days of nasal congestion and progressively worsening facial pain.  States the pain is worse behind her eyes.  Thick yellow nasal congestion.  Denies fever/chills, cough, body aches, nausea/vomiting/diarrhea.  Denies recent URI.  HPI  Past Medical History:  Diagnosis Date  . ADHD (attention deficit hyperactivity disorder)    diagnosed at [facility] clinic.  Marland Kitchen Allergy   . Anxiety    Social anxiety, generalized anxiety disorder.  . Depression     Patient Active Problem List   Diagnosis Date Noted  . Atrial tachycardia ([place])   . Acute systolic CHF (congestive heart failure) ([place])   . Acute congestive heart failure ([place])   . Abdominal pressure   . Hypothyroidism   . Chest pain 02/13/2021  . Morbid obesity ([place]) 12/13/2013    Past Surgical History:  Procedure Laterality Date  . RIGHT/LEFT HEART CATH AND CORONARY ANGIOGRAPHY N/A 02/16/2021   Procedure: RIGHT/LEFT HEART CATH AND CORONARY ANGIOGRAPHY;  Surgeon: Larey Dresser, MD;  Location: [facility];  Service: Cardiovascular;  Laterality: N/A;    OB History   No obstetric history on file.      Home Medications    Prior to Admission medications   Medication Sig Start Date End Date Taking? Authorizing Provider  doxycycline (VIBRAMYCIN) 100 MG capsule Take 1 capsule (100 mg total) by mouth 2 (two) times daily for 7 days. 02/22/21 03/01/21 Yes Hazel Sams, PA-C  acetaminophen (TYLENOL) 325 MG tablet Take 2 tablets  (650 mg total) by mouth every 6 (six) hours as needed for mild pain. 02/20/21   Autry-Lott, Naaman Plummer, DO  amiodarone (PACERONE) 200 MG tablet Take 2 tablets (400 mg total) by mouth 2 (two) times daily for 7 days, THEN 1 tablet (200 mg total) 2 (two) times daily for 7 days, THEN 1 tablet (200 mg total) daily. 02/20/21 05/05/21  Autry-Lott, Naaman Plummer, DO  empagliflozin (JARDIANCE) 10 MG TABS tablet Take 1 tablet (10 mg total) by mouth daily. 02/20/21   Autry-Lott, Naaman Plummer, DO  furosemide (LASIX) 40 MG tablet Take 1 tablet (40 mg total) by mouth daily. 02/20/21 03/22/21  Autry-Lott, Naaman Plummer, DO  metFORMIN (GLUCOPHAGE XR) 500 MG 24 hr tablet Take 1 tablet (500 mg total) by mouth daily with breakfast. 02/20/21 03/22/21  Autry-Lott, Naaman Plummer, DO  metoprolol succinate (TOPROL-XL) 25 MG 24 hr tablet Take 1 tablet (25 mg total) by mouth daily. 02/20/21   Autry-Lott, Naaman Plummer, DO  pantoprazole (PROTONIX) 40 MG tablet Take 1 tablet (40 mg total) by mouth daily. 02/08/21   Truddie Hidden, MD  potassium chloride SA (KLOR-CON) 20 MEQ tablet Take 1 tablet (20 mEq total) by mouth daily. 02/20/21   Autry-Lott, Naaman Plummer, DO  rosuvastatin (CRESTOR) 20 MG tablet Take 1 tablet (20 mg total) by mouth daily. 02/20/21   Autry-Lott, Naaman Plummer, DO  sacubitril-valsartan (ENTRESTO) 24-26 MG Take 1 tablet by mouth 2 (two) times daily. 02/20/21   Autry-Lott, Naaman Plummer, DO  Semaglutide,0.25 or 0.5MG /DOS, (OZEMPIC, 0.25  OR 0.5 MG/DOSE,) 2 MG/1.5ML SOPN Inject 0.25 mg into the skin once a week. 02/20/21   Alcus Dad, MD  spironolactone (ALDACTONE) 25 MG tablet Take 1 tablet (25 mg total) by mouth daily. 02/20/21   Autry-Lott, Naaman Plummer, DO  sucralfate (CARAFATE) 1 g tablet Take 1 tablet (1 g total) by mouth 4 (four) times daily -  with meals and at bedtime. Dissolve 1 tablet into a glass of water and drink 02/09/21   Volney American, PA-C    Family History History reviewed. No pertinent family history.  Social History Social History   Tobacco Use  . Smoking  status: Former Smoker    Quit date: 01/12/2010    Years since quitting: 11.1  . Smokeless tobacco: Never Used  Vaping Use  . Vaping Use: Never used  Substance Use Topics  . Alcohol use: No  . Drug use: No     Allergies   Sulfa antibiotics   Review of Systems Review of Systems  Constitutional: Negative for appetite change, chills and fever.  HENT: Positive for congestion and sinus pressure. Negative for ear pain, rhinorrhea, sinus pain and sore throat.   Eyes: Negative for redness and visual disturbance.  Respiratory: Negative for cough, chest tightness, shortness of breath and wheezing.   Cardiovascular: Negative for chest pain and palpitations.  Gastrointestinal: Negative for abdominal pain, constipation, diarrhea, nausea and vomiting.  Genitourinary: Negative for dysuria, frequency and urgency.  Musculoskeletal: Negative for myalgias.  Neurological: Negative for dizziness, weakness and headaches.  Psychiatric/Behavioral: Negative for confusion.  All other systems reviewed and are negative.    Physical Exam Triage Vital Signs ED Triage Vitals [02/22/21 1433]  Enc Vitals Group     BP 107/67     Pulse Rate 92     Resp 16     Temp 97.7 F (36.5 C)     Temp Source Oral     SpO2 95 %     Weight      Height      Head Circumference      Peak Flow      Pain Score      Pain Loc      Pain Edu?      Excl. in [place]?    No data found.  Updated Vital Signs BP 107/67 (BP Location: Right Arm)   Pulse 92   Temp 97.7 F (36.5 C) (Oral)   Resp 16   SpO2 95%   Visual Acuity Right Eye Distance:   Left Eye Distance:   Bilateral Distance:    Right Eye Near:   Left Eye Near:    Bilateral Near:     Physical Exam Vitals reviewed.  Constitutional:      General: She is not in acute distress.    Appearance: Normal appearance. She is not ill-appearing.  HENT:     Head: Normocephalic and atraumatic.     Right Ear: Hearing, tympanic membrane, ear canal and external ear  normal. No swelling or tenderness. There is no impacted cerumen. No mastoid tenderness. Tympanic membrane is not perforated, erythematous, retracted or bulging.     Left Ear: Hearing, tympanic membrane, ear canal and external ear normal. No swelling or tenderness. There is no impacted cerumen. No mastoid tenderness. Tympanic membrane is not perforated, erythematous, retracted or bulging.     Nose:     Right Sinus: Maxillary sinus tenderness and frontal sinus tenderness present.     Left Sinus: Maxillary sinus tenderness and frontal sinus tenderness  present.     Mouth/Throat:     Mouth: Mucous membranes are moist.     Pharynx: Uvula midline. No oropharyngeal exudate or posterior oropharyngeal erythema.     Tonsils: No tonsillar exudate.  Cardiovascular:     Rate and Rhythm: Normal rate and regular rhythm.     Heart sounds: Normal heart sounds.  Pulmonary:     Breath sounds: Normal breath sounds and air entry. No wheezing, rhonchi or rales.  Chest:     Chest wall: No tenderness.  Abdominal:     General: Abdomen is flat. Bowel sounds are normal.     Tenderness: There is no abdominal tenderness. There is no guarding or rebound.  Lymphadenopathy:     Cervical: No cervical adenopathy.  Neurological:     General: No focal deficit present.     Mental Status: She is alert and oriented to person, place, and time.  Psychiatric:        Attention and Perception: Attention and perception normal.        Mood and Affect: Mood and affect normal.        Behavior: Behavior normal. Behavior is cooperative.        Thought Content: Thought content normal.        Judgment: Judgment normal.      UC Treatments / Results  Labs (all labs ordered are listed, but only abnormal results are displayed) Labs Reviewed - No data to display  EKG   Radiology No results found.  Procedures Procedures (including critical care time)  Medications Ordered in UC Medications - No data to display  Initial  Impression / Assessment and Plan / UC Course  I have reviewed the triage vital signs and the nursing notes.  Pertinent labs & imaging results that were available during my care of the patient were reviewed by me and considered in my medical decision making (see chart for details).     This patient is a 45 year old female presenting with acute sinusitis. Today this pt is afebrile nontachycardic nontachypneic, oxygenating well on room air, no wheezes rhonchi or rales. Negative covid test 1 week ago.  Doxycycline sent as below. Continue claritin.   ED return precautions discussed.  Final Clinical Impressions(s) / UC Diagnoses   Final diagnoses:  Acute recurrent frontal sinusitis  Seasonal allergic rhinitis due to pollen     Discharge Instructions     -Start the antibiotic, doxycycline twice daily for 7 days.  Make sure to wear sunscreen while spending long periods of time outside while on this medication as it can increase your chance of sunburn.  Try taking this about 1 hour after taking your other medications. -Seek additional medical attention if your symptoms worsen or persist, or new symptoms like chest pain, shortness of breath, dizziness, weakness. -Continue claritin for allergic rhinitis component.     ED Prescriptions    Medication Sig Dispense Auth. Provider   doxycycline (VIBRAMYCIN) 100 MG capsule Take 1 capsule (100 mg total) by mouth 2 (two) times daily for 7 days. 14 capsule Hazel Sams, PA-C     PDMP not reviewed this encounter.   Hazel Sams, PA-C 02/22/21 1606

## 2021-02-22 NOTE — ED Triage Notes (Signed)
Pt said pressure in face and above eye brow. Congestion feeling in her face. Tested last week bc was in hospital.

## 2021-02-24 ENCOUNTER — Telehealth: Payer: Self-pay

## 2021-02-24 NOTE — Telephone Encounter (Addendum)
   PA request for Ozempic was denied. Fax will be coming through with recommended next steps. However, it appears that patient would need to try and fail at least 2 Medicaid preferred medications before this medication would be approved.   See below for preferred medications:    Please advise.   Talbot Grumbling, RN

## 2021-02-24 NOTE — Telephone Encounter (Signed)
Received fax from pharmacy, PA needed on Ozempic. Clinical questions submitted via Cover My Meds.  Waiting on response, could take up to 72 hours.  Cover My Meds info: Key: [id number]  Talbot Grumbling, RN

## 2021-02-28 NOTE — Telephone Encounter (Signed)
Advanced Heart Failure Patient Advocate Encounter  Prior Authorization for [name] has been approved.    PA# [id number] Effective dates: 02/16/21 through 02/16/22  Prior Authorization for Delene Loll has been approved.    PA# [id number] Effective dates: 02/16/21 through 02/16/22  Charlann Boxer, CPhT

## 2021-03-01 NOTE — Progress Notes (Signed)
    SUBJECTIVE:   CHIEF COMPLAINT / HPI:   Hospital Follow Up- HFrEF Patient admitted to [facility] from 5/30-6/6 for new onset HFrEF and newly diagnosed diabetes. Has cardiology follow up scheduled for July 13th. Doing well since discharge. Weighs herself daily, has remained at her dry weight (~275lbs on her home scale). Admits to feeling very overwhelmed by the new diagnoses and all the new medications. She is interested in counseling.   T2DM, Weight Management A1c 11.0 on admission to the hospital. BMI 55. Currently taking Metformin XR 500mg  daily. Also on Jardiance 10mg  daily. Unfortunately, prior auth for ozempic was denied. Patient reports she is being more mindful of carb intake. Trying not to restrict, as she has a history of eating disorder. Also monitoring her daily steps and trying to be more active. Patient has the supplies to check her sugars at home but has not done so yet. Saw nutritionist this morning and has follow up arranged.   PERTINENT  PMH / PSH: HFrEF, T2DM  OBJECTIVE:   BP 110/60   Pulse 84   Ht 5' (1.524 m)   Wt 280 lb (127 kg)   LMP 11/30/2020   SpO2 96%   BMI 54.68 kg/m   General: NAD, pleasant, able to participate in exam Cardiac: RRR, S1 S2 present. normal heart sounds, no murmurs. Respiratory: CTAB, normal effort, No wheezes, rales or rhonchi Extremities: no edema or cyanosis. Skin: warm and dry, no rashes noted Neuro: alert, no obvious focal deficits Psych: Normal affect and mood   ASSESSMENT/PLAN:   Type 2 diabetes mellitus without complications ([place]) Newly diagnosed. A1c 11.0 on 02/13/2021. Patient starting to implement lifestyle changes. -Continue Metformin 500mg  daily and Jardiance 10mg  daily -Start Trulicity 0.75mg  weekly -Follow-up with nutritionist -Patient to keep home glucose log and bring to next appointment -Repeat A1c in 2 months   HFrEF Stable, doing well.  -Continue current meds -Has cardiology follow up in 1  month   Provided counseling resources in [place] as patient is feeling overwhelmed with new diagnoses. Consider SSRI at future appointments as needed.   Alcus Dad, MD [place]

## 2021-03-02 ENCOUNTER — Encounter: Payer: Medicaid Other | Attending: Family | Admitting: Registered"

## 2021-03-02 ENCOUNTER — Encounter: Payer: Self-pay | Admitting: Family Medicine

## 2021-03-02 ENCOUNTER — Other Ambulatory Visit: Payer: Self-pay

## 2021-03-02 ENCOUNTER — Ambulatory Visit: Payer: Medicaid Other | Admitting: Family Medicine

## 2021-03-02 ENCOUNTER — Encounter: Payer: Self-pay | Admitting: Registered"

## 2021-03-02 VITALS — BP 110/60 | HR 84 | Ht 60.0 in | Wt 280.0 lb

## 2021-03-02 DIAGNOSIS — I5020 Unspecified systolic (congestive) heart failure: Secondary | ICD-10-CM

## 2021-03-02 DIAGNOSIS — E119 Type 2 diabetes mellitus without complications: Secondary | ICD-10-CM

## 2021-03-02 MED ORDER — TRULICITY 0.75 MG/0.5ML ~~LOC~~ SOAJ: 0.7500 mg | SUBCUTANEOUS | 3 refills | Status: DC

## 2021-03-02 NOTE — Patient Instructions (Signed)
Goals: Follow Diabetes Meal Plan as instructed Eat 3 meals and 2 snacks, every 3-5 hrs Aim to have 1/2 plate of non-starchy vegetables + 1/4 plate of lean protein + 1/4 plate of carbohydrates with lunch and dinner.  Balance breakfast with carbohydrates and lean protein.  Add lean protein foods to snacks. Monitor glucose levels as instructed by your doctor. Check blood sugar 1-4 times a day. Check at least fasting daily.

## 2021-03-02 NOTE — Patient Instructions (Addendum)
It was great to see you!  Things we discussed at today's visit: -I have prescribed Trulicity (once weekly injection) to help with diabetes and weight management -Please call or send a mychart request if you need refills on your medications before your cardiology appointment -Keep working toward >7000 steps daily -Keep a written log if you decide to check your blood sugars -At your next appointment we will recheck your A1c, your thyroid function, and order a sleep study :)  Take care and seek immediate care sooner if you develop any concerns.  Dr. [facility]   Therapy and Counseling Resources Most providers on this list will take Medicaid.  [facility]:Psychiatry and Counseling [address] [facility] [place]. [place], [place] [address] [place], [place], [place] [address]      [facility] [address]  [place], [place] [address] [phone]  [place] [address] [place]  [place], [place] [address]       [phone]     [facility] (virtual only) [phone]  [facility] [address], [place], [place]  [facility]:  [place]. [place] [phone]  [facility]:  [place] [address] [place] [phone]  [facility] (under & uninsured) [address] [place] [phone]    [email or web address]    [place] [address]  [place]    [phone]  [facility] [place]     Phone:  [phone]     [place]  [place] [address]      [place], [place]: [place], [place], [place]   [place] ([place] therapist)  [email or web address]  [place] [address]   [place] [address]    [phone]    [facility]   [address],  [place], [place]   [place]  [place]  [facility]  [address] [place], [place]        [phone]  Open Access/Walk In Clinic under & uninsured  [facility]  [address] [place], [place] Crisis [phone]  [facility] [place],  ([place])   [place], [place]: [phone]) 8:30 - 12; 1 - 2:30  [facility] the [place],  [place], [place]    [phone]):8:30 - 12; 2 - 3PM  [facility] [employer],  [address],  [place]; [phone]):   Mon - Fri 8 AM - 5 PM  [facility] [place]  MWF 12:30 to 3:00 or call to schedule an appointment  [phone]  Specific Provider options Psychology Today  [email or web address] click on find a therapist  enter your zip code left side and select or tailor a therapist for your specific need.   [facility] Provider Directory [email or web address]  (Medicaid)   Follow all drop down to find a provider  [place]) [phone] or [email or web address], [place], [place] Recovery support and educational   24- Hour Availability:  [facility]  [place], [place] Crisis [phone]  [facility] [employer] [place]  [phone]   [place]  [phone] (after hours)  Therapeutic Alternative/Mobile Crisis   [phone]  [place] National Suicide Hotline  8012986731 Diamantina [place])  Call 911 or go to emergency room  [facility]  [phone]);  [place] and [employer]  [phone]); [place], [place], [place], [place], [place], [place], [place]

## 2021-03-02 NOTE — Progress Notes (Signed)
Diabetes Self-Management Education  Visit Type:  First/Initial  Appt. Start Time: 8:05 Appt. End Time: 9:16  03/02/2021  Ms. Vickie Graham, identified by name and date of birth, is a 45 y.o. female with a diagnosis of Diabetes: Type 2.   ASSESSMENT  States she didn't have a PCP prior to this year. Reports she has done Paleo diet for years until experiencing burnout about 5 years ago. States she would walk her dog 2 miles/day during that time. Reports h/o eating disorders - bulimia tat began during teenage years. States she was never officially diagnosed. Reports she was concerned when following Paleo diet and was concerned about orthorexia.   States her husband has diabetes. States her plan once being discharged recently from hospital was to follow Paleo plan. States she was using MyFitnessPal to track calories, sodium, weight, and carbohydrates (sometimes). States her plan is to eat more fruits and vegetables, less refined carbohydrates, manage sodium intake to recommended levels of less than 2000 mg a day, and fluid restriction of 2 liters a day.   States when pandemic started she ordered a lot of Door Dash and it was hard to break that habit. States she has been more focused on CHF diagnosis rather than diabetes. States she feels like if she improves her heart conditions, this will fix everything else. States she has a glucometer but has not checked since being diagnosed. States she was informed not to check unless she is curious about blood sugar numbers.   States she likes walking and hiking. States she is trying to increase step count per day since being discharged from hospital and gaining her energy.   Pt expectations: nutrition; wants to get a better picture of what to do for diabetes. Wants a plan.   There were no vitals taken for this visit. There is no height or weight on file to calculate BMI.    Diabetes Self-Management Education - 03/02/21 0816       Health Coping   How  would you rate your overall health? Good      Psychosocial Assessment   Patient Belief/Attitude about Diabetes Other (comment)   overwhelming   Self-care barriers None    Self-management support Doctor's office;Friends    Patient Concerns Nutrition/Meal planning    Special Needs None    Preferred Learning Style No preference indicated    Learning Readiness Ready      Complications   Last HgB A1C per patient/outside source 11 %    How often do you check your blood sugar? 0 times/day (not testing)    Number of hypoglycemic episodes per month 1    Can you tell when your blood sugar is low? Yes    What do you do if your blood sugar is low? ate fruit    Number of hyperglycemic episodes per week 0    Have you had a dilated eye exam in the past 12 months? No    Have you had a dental exam in the past 12 months? No    Are you checking your feet? No      Dietary Intake   Breakfast [employer] - biscuit + gravy + eggs + 2 slices of bacon + Kambucha    Snack (morning) peach    Lunch [employer] - salad (vegetables, cheese) + oil/vinegar + walnut/cranberry mix    Snack (afternoon) strawberries    Dinner grilled chicken + roasted vegetables + potatoes    Snack (evening) Drumstick ice cream cone  Beverage(s) Kambucha, water      Exercise   Exercise Type ADL's;Light (walking / raking leaves)    How many days per week to you exercise? 0    How many minutes per day do you exercise? 0    Total minutes per week of exercise 0      Patient Education   Previous Diabetes Education No    Disease state  Definition of diabetes, type 1 and 2, and the diagnosis of diabetes;Factors that contribute to the development of diabetes    Nutrition management  Role of diet in the treatment of diabetes and the relationship between the three main macronutrients and blood glucose level;Food label reading, portion sizes and measuring food.;Reviewed blood glucose goals for pre and post meals and how to evaluate the  patients' food intake on their blood glucose level.;Effects of alcohol on blood glucose and safety factors with consumption of alcohol.;Information on hints to eating out and maintain blood glucose control.    Physical activity and exercise  Role of exercise on diabetes management, blood pressure control and cardiac health.    Monitoring Purpose and frequency of SMBG.;Taught/discussed recording of test results and interpretation of SMBG.;Interpreting lab values - A1C, lipid, urine microalbumina.;Identified appropriate SMBG and/or A1C goals.;Daily foot exams;Yearly dilated eye exam    Acute complications Taught treatment of hypoglycemia - the 15 rule.;Discussed and identified patients' treatment of hyperglycemia.    Chronic complications Relationship between chronic complications and blood glucose control;Applicable immunizations;Lipid levels, blood glucose control and heart disease;Reviewed with patient heart disease, higher risk of, and prevention    Psychosocial adjustment Role of stress on diabetes;Identified and addressed patients feelings and concerns about diabetes      Individualized Goals (developed by patient)   Nutrition General guidelines for healthy choices and portions discussed;Follow meal plan discussed    Medications take my medication as prescribed    Monitoring  test my blood glucose as discussed    Reducing Risk do foot checks daily;treat hypoglycemia with 15 grams of carbs if blood glucose less than 70mg /dL;examine blood glucose patterns;increase portions of healthy fats;increase portions of nuts and seeds      Post-Education Assessment   Patient understands the diabetes disease and treatment process. Demonstrates understanding / competency    Patient understands incorporating nutritional management into lifestyle. Demonstrates understanding / competency    Patient undertands incorporating physical activity into lifestyle. Needs Review    Patient understands using medications  safely. Demonstrates understanding / competency    Patient understands monitoring blood glucose, interpreting and using results Demonstrates understanding / competency    Patient understands prevention, detection, and treatment of acute complications. Demonstrates understanding / competency    Patient understands prevention, detection, and treatment of chronic complications. Needs Review    Patient understands how to develop strategies to address psychosocial issues. Demonstrates understanding / competency    Patient understands how to develop strategies to promote health/change behavior. Demonstrates understanding / competency      Outcomes   Program Status Not Completed             Learning Objective:  Patient will have a greater understanding of diabetes self-management. Patient education plan is to attend individual and/or group sessions per assessed needs and concerns.   Plan:   Patient Instructions  Goals: Follow Diabetes Meal Plan as instructed Eat 3 meals and 2 snacks, every 3-5 hrs Aim to have 1/2 plate of non-starchy vegetables + 1/4 plate of lean protein + 1/4 plate of carbohydrates with  lunch and dinner.  Balance breakfast with carbohydrates and lean protein.  Add lean protein foods to snacks. Monitor glucose levels as instructed by your doctor. Check blood sugar 1-4 times a day. Check at least fasting daily.      Expected Outcomes:  Demonstrated interest in learning. Expect positive outcomes  Education material provided: ADA - How to Thrive: A Guide for Your Journey with Diabetes  If problems or questions, patient to contact team via:  Phone and Email  Future DSME appointment: - 4-6 wks

## 2021-03-03 DIAGNOSIS — I5020 Unspecified systolic (congestive) heart failure: Secondary | ICD-10-CM | POA: Insufficient documentation

## 2021-03-03 NOTE — Assessment & Plan Note (Addendum)
Newly diagnosed. A1c 11.0 on 02/13/2021. Patient starting to implement lifestyle changes. -Continue Metformin 500mg  daily and Jardiance 10mg  daily -Start Trulicity 0.75mg  weekly -Follow-up with nutritionist -Patient to keep home glucose log and bring to next appointment -Repeat A1c in 2 months

## 2021-03-12 ENCOUNTER — Other Ambulatory Visit: Payer: Self-pay | Admitting: Family Medicine

## 2021-03-13 ENCOUNTER — Other Ambulatory Visit (HOSPITAL_COMMUNITY): Payer: Self-pay

## 2021-03-13 ENCOUNTER — Encounter (HOSPITAL_COMMUNITY): Payer: Medicaid Other

## 2021-03-13 MED ORDER — FUROSEMIDE 40 MG PO TABS
40.0000 mg | ORAL_TABLET | Freq: Every day | ORAL | 0 refills | Status: DC
  Filled 2021-03-13: qty 30, 30d supply, fill #0

## 2021-03-13 MED ORDER — SPIRONOLACTONE 25 MG PO TABS
25.0000 mg | ORAL_TABLET | Freq: Every day | ORAL | 0 refills | Status: DC
  Filled 2021-03-13: qty 30, 30d supply, fill #0

## 2021-03-13 MED ORDER — ENTRESTO 24-26 MG PO TABS
1.0000 | ORAL_TABLET | Freq: Two times a day (BID) | ORAL | 0 refills | Status: DC
  Filled 2021-03-13: qty 60, 30d supply, fill #0

## 2021-03-13 MED ORDER — METFORMIN HCL ER 500 MG PO TB24
500.0000 mg | ORAL_TABLET | Freq: Every day | ORAL | 0 refills | Status: DC
  Filled 2021-03-13: qty 30, 30d supply, fill #0

## 2021-03-13 MED ORDER — EMPAGLIFLOZIN 10 MG PO TABS
10.0000 mg | ORAL_TABLET | Freq: Every day | ORAL | 0 refills | Status: DC
  Filled 2021-03-13: qty 30, 30d supply, fill #0

## 2021-03-13 MED ORDER — POTASSIUM CHLORIDE CRYS ER 20 MEQ PO TBCR
20.0000 meq | EXTENDED_RELEASE_TABLET | Freq: Every day | ORAL | 0 refills | Status: DC
  Filled 2021-03-13: qty 30, 30d supply, fill #0

## 2021-03-14 NOTE — Progress Notes (Signed)
    SUBJECTIVE:   CHIEF COMPLAINT / HPI:   Concern for sinus infection: 45 year old female present today for concern of sinus infection.  She states she was given doxycycline on 6/8-6/16 for a sinus infection, she had symptoms for about a week before that per her. She states that the sinus infection she had then fully went away but she started having symptoms again. Symptoms started back about 5 days ago and were the worst on Monday. She was having headache, sinus pressure, runny nose, fatigue. No fever. No shortness of breath. Her main symptoms are headache and sinus pressure which she states is worse this time.   PERTINENT  PMH / PSH: Recent sinus infection.  OBJECTIVE:   BP 100/64   Pulse 84   Temp 98.7 F (37.1 C) (Oral)   Ht 5' (1.524 m)   LMP 03/08/2021   SpO2 96%   BMI 54.68 kg/m    General: NAD, pleasant, able to participate in exam HEENT: No pharyngeal erythema Cardiac: RRR, no murmurs. Respiratory: CTAB, normal effort, No wheezes, rales or rhonchi Abdomen: Bowel sounds present, nontender Skin: warm and dry, no rashes noted  ASSESSMENT/PLAN:   Sinusitis Assessment: 45 y.o. female with symptoms consistent with a viral upper respiratory tract infection.  Patient does not have any concerning symptoms.  No shortness of breath.  She has been treated multiple times in recent past for sinus infections.  I discussed with her the possibility of getting a sinus CT which also discussed with Dr. Nori Riis.  We will order this test to evaluate if she is having chronic rhinosinusitis versus acute sinusitis versus other etiology.   Plan: -Sinus CT as above.  We will hold off antibiotics until it resolves. -Discussed return precautions -Discussed symptomatic treatment and the lack of need for antibiotics.    Vickie Graham, [place]

## 2021-03-14 NOTE — Patient Instructions (Addendum)
It was great to see you! Thank you for allowing me to participate in your care!  Our plans for today:  -We are going to get a CT of your sinuses to evaluate for an infection.  I will hold off on antibiotics until this returns. -I sent in a few of your refill request but some of them are may require additional testing before I can prescribe them so I recommend you make up appointment with your primary care doctor in the next 1 to 2 weeks.  For your Delene Loll recommend that you reach out to your cardiologist to feel that as it would be more appropriate coming from them. -If you develop any chest pains, trouble breathing, shortness of breath, vomiting to the extent that she cannot keep down fluids, or any other concerning symptoms I would like you to immediately let us know or go to the emergency department.  We are checking some labs today, I will call you if they are abnormal will send you a MyChart message or a letter if they are normal.  If you do not hear about your labs in the next 2 weeks please let us know.  Take care and seek immediate care sooner if you develop any concerns.   Dr. Lurline Del, [place]

## 2021-03-15 ENCOUNTER — Ambulatory Visit (INDEPENDENT_AMBULATORY_CARE_PROVIDER_SITE_OTHER): Payer: Medicaid Other | Admitting: Family Medicine

## 2021-03-15 ENCOUNTER — Other Ambulatory Visit: Payer: Self-pay

## 2021-03-15 ENCOUNTER — Encounter (HOSPITAL_COMMUNITY): Payer: Self-pay

## 2021-03-15 ENCOUNTER — Other Ambulatory Visit (HOSPITAL_COMMUNITY): Payer: Self-pay | Admitting: *Deleted

## 2021-03-15 ENCOUNTER — Telehealth (HOSPITAL_COMMUNITY): Payer: Self-pay | Admitting: *Deleted

## 2021-03-15 VITALS — BP 100/64 | HR 84 | Temp 98.7°F | Ht 60.0 in

## 2021-03-15 DIAGNOSIS — J329 Chronic sinusitis, unspecified: Secondary | ICD-10-CM

## 2021-03-15 DIAGNOSIS — J3489 Other specified disorders of nose and nasal sinuses: Secondary | ICD-10-CM

## 2021-03-15 MED ORDER — AMIODARONE HCL 200 MG PO TABS: 200.0000 mg | ORAL_TABLET | Freq: Every day | ORAL | 0 refills | Status: DC

## 2021-03-15 MED ORDER — EMPAGLIFLOZIN 10 MG PO TABS: 10.0000 mg | ORAL_TABLET | Freq: Every day | ORAL | 0 refills | Status: DC

## 2021-03-15 MED ORDER — POTASSIUM CHLORIDE CRYS ER 20 MEQ PO TBCR: 20.0000 meq | EXTENDED_RELEASE_TABLET | Freq: Every day | ORAL | 0 refills | Status: DC

## 2021-03-15 MED ORDER — FUROSEMIDE 40 MG PO TABS: 40.0000 mg | ORAL_TABLET | Freq: Every day | ORAL | 0 refills | Status: DC

## 2021-03-15 MED ORDER — ENTRESTO 24-26 MG PO TABS: 1.0000 | ORAL_TABLET | Freq: Two times a day (BID) | ORAL | 0 refills | Status: DC

## 2021-03-15 MED ORDER — ROSUVASTATIN CALCIUM 20 MG PO TABS: 20.0000 mg | ORAL_TABLET | Freq: Every day | ORAL | 0 refills | Status: DC

## 2021-03-15 MED ORDER — METOPROLOL SUCCINATE ER 25 MG PO TB24: 25.0000 mg | ORAL_TABLET | Freq: Every day | ORAL | 0 refills | Status: DC

## 2021-03-15 MED ORDER — SPIRONOLACTONE 25 MG PO TABS: 25.0000 mg | ORAL_TABLET | Freq: Every day | ORAL | 0 refills | Status: DC

## 2021-03-15 NOTE — Assessment & Plan Note (Signed)
Assessment: 45 y.o. female with symptoms consistent with a viral upper respiratory tract infection.  Patient does not have any concerning symptoms.  No shortness of breath.  She has been treated multiple times in recent past for sinus infections.  I discussed with her the possibility of getting a sinus CT which also discussed with Dr. Nori Riis.  We will order this test to evaluate if she is having chronic rhinosinusitis versus acute sinusitis versus other etiology.   Plan: -Sinus CT as above.  We will hold off antibiotics until it resolves. -Discussed return precautions -Discussed symptomatic treatment and the lack of need for antibiotics.

## 2021-03-15 NOTE — Telephone Encounter (Signed)
Pt left vm stating her weight is up 4lbs x 4days. Pt has not been seen in office yet but is scheduled for a hospital f/u 7/13. I called pt back to get more information. No answer/left vm.

## 2021-03-16 ENCOUNTER — Telehealth: Payer: Self-pay | Admitting: Licensed Clinical Social Worker

## 2021-03-16 NOTE — Telephone Encounter (Signed)
Reached pt at [phone] introduced self, role, reason for call. Invited pt to join [place] at [facility] which will be held 7/6 at 10 am at [place]. Encouraged her to call the clinic if any additional questions/concerns.   Westley Hummer, MSW, [place]  [phone]

## 2021-03-23 ENCOUNTER — Other Ambulatory Visit: Payer: Self-pay

## 2021-03-23 ENCOUNTER — Inpatient Hospital Stay (INDEPENDENT_AMBULATORY_CARE_PROVIDER_SITE_OTHER): Payer: Medicaid Other | Admitting: Primary Care

## 2021-03-24 MED ORDER — METFORMIN HCL ER 500 MG PO TB24: 500.0000 mg | ORAL_TABLET | Freq: Every day | ORAL | 0 refills | Status: DC

## 2021-03-28 NOTE — Progress Notes (Signed)
ADVANCED HF CLINIC NOTE  Primary Care: Dr. Alcus Dad Primary HF Cardiologist: Dr. Aundra Dubin  HPI: 45 y/o female w/ h/o obesity, ADHD, anxiety, depression, hypothyroidism, GERD, PACs, new diagnosis of DM2, and systolic HF.  Presented to ED on 5/30 w/ complaints of 3 week h/o progressive DOE, LEE and abdominal fullness. Found to be in acute CHF. BNP 1388. CXR w/ mild congestive CHF w/ interstitial edema. EKG showed ST 120 bpm. HS trop 48>>50.  TSH 13.416. Free T4 WNL at 0.78. Chest CT negative for PE.   She was admitted for CHF and started on IV Lasix. Echo showed biventricular dysfunction. LVEF severely reduced 20-25% w/ global HK. Mild LVH. RV moderately reduced w/ estimated RVSP 36.1 mmHg. Also found to have new diagnosis of T2D, Hgb A1c 11. She responded well to diuresis and was transitioned to oral Lasix and discharged on GDMT, weight down 5 lbs.   Today she returns for HF follow up. Overall feeling fine, energy is better. She has started walking at apartment complex. Denies increasing SOB, CP, dizziness, edema, or PND/Orthopnea. Appetite ok. No fever or chills. Weight at home 274 pounds. Taking all medications. 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  Cardiac Studies: - Echo (02/14/21): EF 20-25%, severe LV dysfunction, RV moderately reduced  - cMRI (5/22): LVEF 21%, diffuse hypokinesis, normal RV size with EF 30%, No definite myocardial LGE (though difficult images).   - L/RHC (6/22): No significant CAD, PCWP and LVEDP remain elevated, pulmonary venous hypertension, preserved CO, , nonischemic CM.  RHC Procedural Findings: Hemodynamics (mmHg) RA mean 6 RV 47/11 PA 53/20, mean 30 PCWP mean 20 LV 105/26 AO 104/79  Oxygen saturations: PA 66% AO 91%  Cardiac Output (Fick) 5.93  Cardiac Index (Fick) 2.74  PVR 1.7 WU  CO (Thermodilution) 6.93  CI (Thermodilution) 3.2 PVR 1.4 WU   Review of Systems: [y] = yes, [ ]  = no    General: Weight gain [ ] ; Weight loss [ ] ; Anorexia [ ] ; Fatigue [ ] ; Fever [ ] ; Chills [ ] ; Weakness [ ]   Cardiac: Chest pain/pressure [ ] ; Resting SOB [ ] ; Exertional SOB [ ] ; Orthopnea [ ] ; Pedal Edema [ ] ; Palpitations [ ] ; Syncope [ ] ; Presyncope [ ] ; Paroxysmal nocturnal dyspnea[ ]   Pulmonary: Cough [ ] ; Wheezing[ ] ; Hemoptysis[ ] ; Sputum [ ] ; Snoring [ ]   GI: Vomiting[ ] ; Dysphagia[ ] ; Melena[ ] ; Hematochezia [ ] ; Heartburn[ ] ; Abdominal pain [ ] ; Constipation [ ] ; Diarrhea [ ] ; BRBPR [ ]   GU: Hematuria[ ] ; Dysuria [ ] ; Nocturia[ ]   Vascular: Pain in legs with walking [ ] ; Pain in feet with lying flat [ ] ; Non-healing sores [ ] ; Stroke [ ] ; TIA [ ] ; Slurred speech [ ] ;  Neuro: Headaches[ ] ; Vertigo[ ] ; Seizures[ ] ; Paresthesias[ ] ;Blurred vision [ ] ; Diplopia [ ] ; Vision changes [ ]   Ortho/Skin: Arthritis [ ] ; Joint pain [ ] ; Muscle pain [ ] ; Joint swelling [ ] ; Back Pain [ ] ; Rash [ ]   Psych: Depression[ ] ; Anxiety[ ]   Heme: Bleeding problems [ ] ; Clotting disorders [ ] ; Anemia [ ]   Endocrine: Diabetes [ y]; Thyroid dysfunction[ y]  Past Medical History:  Diagnosis Date   ADHD (attention deficit hyperactivity disorder)    diagnosed at [facility] clinic.   Allergy    Anxiety    Social anxiety, generalized anxiety disorder.   Depression    Diabetes mellitus without complication ([place])  HFrEF (heart failure with reduced ejection fraction) (HCC)    Hypertension    Current Outpatient Medications  Medication Sig Dispense Refill   acetaminophen (TYLENOL) 325 MG tablet Take 2 tablets (650 mg total) by mouth every 6 (six) hours as needed for mild pain.     amiodarone (PACERONE) 200 MG tablet Take 1 tablet (200 mg total) by mouth daily. 30 tablet 0   empagliflozin (JARDIANCE) 10 MG TABS tablet Take 1 tablet (10 mg total) by mouth daily. 30 tablet 0   furosemide (LASIX) 40 MG tablet Take 1 tablet (40 mg total) by mouth daily. 30 tablet 0   metFORMIN (GLUCOPHAGE XR) 500 MG 24 hr tablet Take  1 tablet (500 mg total) by mouth daily with breakfast. 30 tablet 0   metoprolol succinate (TOPROL-XL) 25 MG 24 hr tablet Take 1 tablet (25 mg total) by mouth daily. 30 tablet 0   potassium chloride SA (KLOR-CON) 20 MEQ tablet Take 1 tablet (20 mEq total) by mouth daily. (Patient taking differently: Take 20 mEq by mouth. Patient takes differently every other day.) 30 tablet 0   rosuvastatin (CRESTOR) 20 MG tablet Take 1 tablet (20 mg total) by mouth daily. 90 tablet 0   sacubitril-valsartan (ENTRESTO) 24-26 MG Take 1 tablet by mouth 2 (two) times daily. 60 tablet 0   spironolactone (ALDACTONE) 25 MG tablet Take 1 tablet (25 mg total) by mouth daily. 30 tablet 0   No current facility-administered medications for this encounter.   Allergies  Allergen Reactions   Sulfa Antibiotics    Social History   Socioeconomic History   Marital status: Married    Spouse name: Not on file   Number of children: Not on file   Years of education: Not on file   Highest education level: Not on file  Occupational History   Not on file  Tobacco Use   Smoking status: Former    Pack years: 0.00    Types: Cigarettes    Quit date: 01/12/2010    Years since quitting: 11.2   Smokeless tobacco: Never  Vaping Use   Vaping Use: Never used  Substance and Sexual Activity   Alcohol use: No   Drug use: No   Sexual activity: Not on file  Other Topics Concern   Not on file  Social History Narrative   Marital status: divorced and engaged since 2011.     Children: 2 children      Lives with fiance and two children (68, 10)      Employment: childcare early development; 3s and 4s. Surveyor, quantity      Tobacco: none      Alcohol: none      Drugs; None      Exercise: sporadic.           Social Determinants of Health   Financial Resource Strain: Medium Risk   Difficulty of Paying Living Expenses: Somewhat hard  Food Insecurity: No Food Insecurity   Worried About Charity fundraiser in the Last Year: Never  true   Ran Out of Food in the Last Year: Never true  Transportation Needs: No Transportation Needs   Lack of Transportation (Medical): No   Lack of Transportation (Non-Medical): No  Physical Activity: Not on file  Stress: Not on file  Social Connections: Not on file  Intimate Partner Violence: Not on file   No family history of CHF or CAD.   BP 120/76   Pulse 84   Wt 126.9 kg (279  lb 12.8 oz)   LMP 03/08/2021   SpO2 93%   BMI 54.64 kg/m   Wt Readings from Last 3 Encounters:  03/29/21 126.9 kg (279 lb 12.8 oz)  03/02/21 127 kg (280 lb)  02/20/21 125.6 kg (277 lb)   PHYSICAL EXAM: General:  NAD. No resp difficulty HEENT: Normal Neck: Supple. JVP 6-7. Carotids 2+ bilat; no bruits. No lymphadenopathy or thryomegaly appreciated. Cor: PMI nondisplaced. Regular rate & rhythm. No rubs, gallops or murmurs. Lungs: Clear Abdomen: Obese, nontender, nondistended. No hepatosplenomegaly. No bruits or masses. Good bowel sounds. Extremities: No cyanosis, clubbing, rash, edema Neuro: Alert & oriented x 3, cranial nerves grossly intact. Moves all 4 extremities w/o difficulty. Affect pleasant.  ECG: SR 85 bpm (personally reviewed).  ASSESSMENT & PLAN:  1. Chronic systolic CHF:  New onset cardiomyopathy of uncertain etiology. Echo this admission with EF 20-25%, moderately decreased RV systolic function.  LHC/RHC with preserved cardiac output, no significant coronary disease (nonischemic cardiomyopathy). Cardiac MRI with LV EF 21%, RV EF 30%, no myocardial LGE.  No ETOH, no drugs, no FH of CAD or CMP. Consider prior viral myocarditis, effects of diabetes itself, or newly-diagnosed hypothyroidism.  However, patient also has had frequent episodes of rapid atrial tachycardia in the hospital and says that her HR has often been up to 150 bpm at home on her Apple Watch. I wonder if this is not a tachycardia-mediated CMP.  NYHA II today, she is not volume overloaded on exam. - Continue lasix 40 mg daily   - Increase Entresto to 49/51 mg bid.  BMET today, repeat 10 days. - Continue sprionolactone 25 mg daily. - Continue Toprol XL 25 mg daily. - Continue Jardiance 10 mg daily. No GU symptoms. - Need to maintain NSR. 2. Type 2 diabetes: New diagnosis, per PCP. A1c 11. On Jardiance. Watch for GU symptoms. 3. Hypothyroidism: New diagnosis, per PCP.  - Check TSH today. 4. Hyperlipidemia: On statin. Check Lipids today. If TGs remain high, consider Vascepa. - Advised her to continue statin, especially with her metabolic disease and ASCVD risk. 5. Atrial tachycardia: Frequent episodes, recurred when amiodarone stopped.  On questioning, her HR has frequently been elevated as high as 150s at home though she does not feel palpitations.  I am concerned that she could have a tachy-mediated CMP from AT.  Now back in NSR on amiodarone. - Continue amiodarone 200 mg daily. - EP has seen, think that her high BMI makes ablation less likely to be successful, recommend continue amiodarone. - She will need regular eye exams. 6. Renal Insufficiency: (likely over-diuresis). - BMET today. 7. Obesity: She is working on weight loss through dietary changes and increased activity. - Body mass index is 54.64 kg/m. 8. Snores: She says she snores. Sleep study would be helpful to evaluate for OSA. She said her PCP is going to order at her next appt.   Follow up in 4 weeks with APP.   - She would like to start taking herbal supplements. I have advised against this. Asked her to talk to provider or PharmD before starting any herbals/supplements, especially due to current HF medications and risk of potential reaction.  Allena Katz, FNP-BC 03/29/21

## 2021-03-29 ENCOUNTER — Ambulatory Visit (HOSPITAL_COMMUNITY)
Admission: RE | Admit: 2021-03-29 | Discharge: 2021-03-29 | Disposition: A | Discharge status: Home / Self Care | Payer: Medicaid Other | Source: Ambulatory Visit | Attending: Family Medicine | Admitting: Family Medicine

## 2021-03-29 ENCOUNTER — Other Ambulatory Visit: Payer: Medicaid Other

## 2021-03-29 ENCOUNTER — Encounter (HOSPITAL_COMMUNITY): Payer: Self-pay

## 2021-03-29 ENCOUNTER — Other Ambulatory Visit: Payer: Self-pay

## 2021-03-29 VITALS — BP 120/76 | HR 84 | Wt 279.8 lb

## 2021-03-29 DIAGNOSIS — E785 Hyperlipidemia, unspecified: Secondary | ICD-10-CM | POA: Diagnosis not present

## 2021-03-29 DIAGNOSIS — Z7984 Long term (current) use of oral hypoglycemic drugs: Secondary | ICD-10-CM | POA: Insufficient documentation

## 2021-03-29 DIAGNOSIS — Z6841 Body Mass Index (BMI) 40.0 and over, adult: Secondary | ICD-10-CM | POA: Diagnosis not present

## 2021-03-29 DIAGNOSIS — E669 Obesity, unspecified: Secondary | ICD-10-CM | POA: Diagnosis not present

## 2021-03-29 DIAGNOSIS — I5020 Unspecified systolic (congestive) heart failure: Secondary | ICD-10-CM | POA: Diagnosis not present

## 2021-03-29 DIAGNOSIS — N289 Disorder of kidney and ureter, unspecified: Secondary | ICD-10-CM

## 2021-03-29 DIAGNOSIS — I471 Supraventricular tachycardia: Secondary | ICD-10-CM | POA: Insufficient documentation

## 2021-03-29 DIAGNOSIS — I110 Hypertensive heart disease with heart failure: Secondary | ICD-10-CM | POA: Insufficient documentation

## 2021-03-29 DIAGNOSIS — Z79899 Other long term (current) drug therapy: Secondary | ICD-10-CM | POA: Diagnosis not present

## 2021-03-29 DIAGNOSIS — I5022 Chronic systolic (congestive) heart failure: Secondary | ICD-10-CM | POA: Diagnosis not present

## 2021-03-29 DIAGNOSIS — E039 Hypothyroidism, unspecified: Secondary | ICD-10-CM | POA: Diagnosis not present

## 2021-03-29 DIAGNOSIS — R0683 Snoring: Secondary | ICD-10-CM | POA: Diagnosis not present

## 2021-03-29 DIAGNOSIS — Z882 Allergy status to sulfonamides status: Secondary | ICD-10-CM | POA: Insufficient documentation

## 2021-03-29 DIAGNOSIS — E119 Type 2 diabetes mellitus without complications: Secondary | ICD-10-CM | POA: Diagnosis not present

## 2021-03-29 LAB — LIPID PANEL
Cholesterol: 143 mg/dL (ref 0–200)
HDL: 25 mg/dL — ABNORMAL LOW (ref >40)
LDL Cholesterol: UNDETERMINED mg/dL (ref 0–99)
Total CHOL/HDL Ratio: 5.7 RATIO
Triglycerides: 407 mg/dL — ABNORMAL HIGH (ref <150)
VLDL: UNDETERMINED mg/dL (ref 0–40)

## 2021-03-29 LAB — BASIC METABOLIC PANEL
Anion gap: 11 (ref 5–15)
BUN: 28 mg/dL — ABNORMAL HIGH (ref 6–20)
CO2: 22 mmol/L (ref 22–32)
Calcium: 9.4 mg/dL (ref 8.9–10.3)
Chloride: 101 mmol/L (ref 98–111)
Creatinine, Ser: 1.1 mg/dL — ABNORMAL HIGH (ref 0.44–1.00)
GFR, Estimated: >60 mL/min (ref >60)
Glucose, Bld: 126 mg/dL — ABNORMAL HIGH (ref 70–99)
Potassium: 4.7 mmol/L (ref 3.5–5.1)
Sodium: 134 mmol/L — ABNORMAL LOW (ref 135–145)

## 2021-03-29 LAB — LDL CHOLESTEROL, DIRECT: Direct LDL: 68 mg/dL (ref 0–99)

## 2021-03-29 LAB — TSH: TSH: 8.957 u[IU]/mL — ABNORMAL HIGH (ref 0.350–4.500)

## 2021-03-29 MED ORDER — ENTRESTO 49-51 MG PO TABS: 1.0000 | ORAL_TABLET | Freq: Two times a day (BID) | ORAL | 6 refills | Status: DC

## 2021-03-29 NOTE — Patient Instructions (Signed)
INCREASE Entresto to 49/51 mg, one tab twice a day  Labs today We will only contact you if something comes back abnormal or we need to make some changes. Otherwise no news is good news!  Labs needed in 7-14 days  Your physician recommends that you schedule a follow-up appointment in: 4 weeks  in the Advanced Practitioners (PA/NP) Clinic    Do the following things EVERYDAY: Weigh yourself in the morning before breakfast. Write it down and keep it in a log. Take your medicines as prescribed Eat low salt foods--Limit salt (sodium) to 2000 mg per day.  Stay as active as you can everyday Limit all fluids for the day to less than 2 liters  milAt the Advanced [facility] Clinic, you and your health needs are our priority. As part of our continuing mission to provide you with exceptional heart care, we have created designated Provider Care Teams. These Care Teams include your primary Cardiologist (physician) and Advanced Practice Providers (APPs- Physician Assistants and Nurse Practitioners) who all work together to provide you with the care you need, when you need it.   You may see any of the following providers on your designated Care Team at your next follow up: Dr Glori Bickers Dr Loralie Champagne Dr Patrice Paradise, NP Lyda Jester, [place] Ginnie Smart Audry Riles, PharmD   Please be sure to bring in all your medications bottles to every appointment.    If you have any questions or concerns before your next appointment please send [place] a message through [place] or call our office at [phone].    TO LEAVE A MESSAGE FOR THE NURSE SELECT OPTION 2, PLEASE LEAVE A MESSAGE INCLUDING: YOUR NAME DATE OF BIRTH CALL BACK NUMBER REASON FOR CALL**this is important as we prioritize the call backs  YOU WILL RECEIVE A CALL BACK THE SAME DAY AS LONG AS YOU CALL BEFORE 4:00 PM

## 2021-03-30 ENCOUNTER — Encounter (HOSPITAL_COMMUNITY): Payer: Self-pay

## 2021-03-31 ENCOUNTER — Other Ambulatory Visit (HOSPITAL_COMMUNITY): Payer: Self-pay

## 2021-04-10 ENCOUNTER — Other Ambulatory Visit: Payer: Self-pay

## 2021-04-10 ENCOUNTER — Ambulatory Visit: Payer: Medicaid Other | Admitting: Family Medicine

## 2021-04-10 ENCOUNTER — Ambulatory Visit (HOSPITAL_COMMUNITY)
Admission: RE | Admit: 2021-04-10 | Discharge: 2021-04-10 | Disposition: A | Discharge status: Home / Self Care | Payer: Medicaid Other | Source: Ambulatory Visit | Attending: Cardiology | Admitting: Cardiology

## 2021-04-10 ENCOUNTER — Encounter: Payer: Self-pay | Admitting: Family Medicine

## 2021-04-10 VITALS — BP 114/61 | HR 84 | Ht 60.0 in | Wt 278.8 lb

## 2021-04-10 DIAGNOSIS — E1169 Type 2 diabetes mellitus with other specified complication: Secondary | ICD-10-CM | POA: Diagnosis not present

## 2021-04-10 DIAGNOSIS — E785 Hyperlipidemia, unspecified: Secondary | ICD-10-CM

## 2021-04-10 DIAGNOSIS — E119 Type 2 diabetes mellitus without complications: Secondary | ICD-10-CM | POA: Diagnosis not present

## 2021-04-10 DIAGNOSIS — F419 Anxiety disorder, unspecified: Secondary | ICD-10-CM | POA: Diagnosis present

## 2021-04-10 DIAGNOSIS — I5020 Unspecified systolic (congestive) heart failure: Secondary | ICD-10-CM | POA: Diagnosis present

## 2021-04-10 DIAGNOSIS — F32A Depression, unspecified: Secondary | ICD-10-CM

## 2021-04-10 LAB — BASIC METABOLIC PANEL
Anion gap: 11 (ref 5–15)
BUN: 26 mg/dL — ABNORMAL HIGH (ref 6–20)
CO2: 21 mmol/L — ABNORMAL LOW (ref 22–32)
Calcium: 9.6 mg/dL (ref 8.9–10.3)
Chloride: 104 mmol/L (ref 98–111)
Creatinine, Ser: 1.22 mg/dL — ABNORMAL HIGH (ref 0.44–1.00)
GFR, Estimated: 56 mL/min — ABNORMAL LOW (ref >60)
Glucose, Bld: 173 mg/dL — ABNORMAL HIGH (ref 70–99)
Potassium: 4.6 mmol/L (ref 3.5–5.1)
Sodium: 136 mmol/L (ref 135–145)

## 2021-04-10 MED ORDER — FLUOXETINE HCL 20 MG PO TABS: 20.0000 mg | ORAL_TABLET | Freq: Every day | ORAL | 1 refills | Status: DC

## 2021-04-10 NOTE — Progress Notes (Signed)
    SUBJECTIVE:   CHIEF COMPLAINT / HPI:   Anxiety Patient endorses significant anxiety. States this has been a longstanding issue for her. It has been exacerbated by all her recent medical diagnoses. Patient feels like her anxiety wakes her up at night. Does not think she has sleep apnea and would prefer to hold off on a sleep study at this time. Denies snoring. States she has worked really hard to deal with stress over past week and has seen an improvement in her sleep as a result. Also noticed an improvement in her fasting sugars She has not gone to therapy but is interested in doing so. She is open to the idea of medication. Has never been on medication in the past for anxiety/depression.  PERTINENT  PMH / PSH: T2DM, HFrEF, atrial tachycardia, obesity, hypothyroidism  OBJECTIVE:   BP 114/61   Pulse 84   Ht 5' (1.524 m)   Wt 278 lb 12.8 oz (126.5 kg)   LMP 04/03/2021 (Exact Date)   SpO2 93%   BMI 54.45 kg/m   General: NAD, pleasant, able to participate in exam Cardiac: RRR, S1 S2 present. normal heart sounds, no murmurs. Respiratory: CTAB, normal effort, No wheezes, rales or rhonchi Abdomen: non-tender, non-distended, no hepatosplenomegaly Skin: warm and dry, no rashes noted Neuro: alert, no obvious focal deficits Psych: Normal affect and mood Feet: No deformities appreciated. Skin intact bilaterally without callous, ulcer, or other abnormality. Sensation intact to light tough and monofilament testing bilaterally.  ASSESSMENT/PLAN:   Anxiety Patient endorses longstanding hx of untreated anxiety. She is now open to the idea of medication and counseling. No concern for bipolar disorder, no hx of mania. PHQ-9 score of 8 today, no SI.  -Start Prozac '20mg'$  daily -Obtain GAD-7 at next visit -Therapy resources provided with AVS -Return precautions discussed -Follow up in 4 weeks  Hyperlipidemia associated with type 2 diabetes mellitus ([place]) Last lipid panel 03/29/2021 showed  cholesterol 143, HDL 25, triglycerides 407, unable to calculate LDL due to triglycerides >400. She was not fasting. -Check fasting lipid panel -Continue rosuvastatin '20mg'$  daily -If triglycerides elevated on fasting panel, will start Vascepa. Discussed this with patient and she is agreeable  Type 2 diabetes mellitus without complications ([place]) Foot exam performed today and was normal. -Continue current regimen (Metformin '500mg'$  daily, Jardiance '10mg'$  daily) -Return in 1 month for diabetes visit (recheck A1c at that time) -Needs diabetic eye exam. She already has an optometrist.    Morbid obesity ([place]) Patient working on lifestyle modifications. Denies snoring at today's visit. Discussed she will likely still need a sleep study to rule out OSA, but it would be reasonable to postpone this to a future visit. She wishes to address her anxiety first.   Alcus Dad, MD [place]

## 2021-04-10 NOTE — Patient Instructions (Addendum)
It was great to see you!  Things we discussed at today's visit: - We are starting a medication called Fluoxetine (Prozac) to help with your anxiety/mood. Please call the office if you experience any adverse effects (super energetic, not sleeping, talking rapidly, etc). Be aware it takes at least 4-6 weeks to notice a significant improvement - I would encourage you to start therapy as well. See resources below - We will schedule a lab visit to check a FASTING lipid panel. If your triglycerides are still high, you may need another cholesterol medication. - Continue taking all your medications as prescribed. Call your pharmacy if you need refills on anything. - We will recheck your A1c at your next visit in 1 month - Great job getting 7000 steps per day! Keep it up!  Take care and seek immediate care sooner if you develop any concerns.  Dr. [facility]

## 2021-04-11 ENCOUNTER — Telehealth: Payer: Self-pay

## 2021-04-11 DIAGNOSIS — E1169 Type 2 diabetes mellitus with other specified complication: Secondary | ICD-10-CM | POA: Insufficient documentation

## 2021-04-11 DIAGNOSIS — F419 Anxiety disorder, unspecified: Secondary | ICD-10-CM | POA: Insufficient documentation

## 2021-04-11 DIAGNOSIS — E785 Hyperlipidemia, unspecified: Secondary | ICD-10-CM | POA: Insufficient documentation

## 2021-04-11 MED ORDER — FLUOXETINE HCL 20 MG PO CAPS
20.0000 mg | ORAL_CAPSULE | Freq: Every day | ORAL | 1 refills | Status: DC
Start: 2021-04-11 — End: 2021-06-15

## 2021-04-11 NOTE — Assessment & Plan Note (Signed)
Last lipid panel 03/29/2021 showed cholesterol 143, HDL 25, triglycerides 407, unable to calculate LDL due to triglycerides >400. She was not fasting. -Check fasting lipid panel -Continue rosuvastatin '20mg'$  daily -If triglycerides elevated on fasting panel, will start Vascepa. Discussed this with patient and she is agreeable

## 2021-04-11 NOTE — Telephone Encounter (Signed)
Fax received from [facility]. Pharmacy would like to use capsules instead of tablets? Insurance prefers. Ottis Stain, CMA

## 2021-04-11 NOTE — Assessment & Plan Note (Signed)
Patient endorses longstanding hx of untreated anxiety. She is now open to the idea of medication and counseling. No concern for bipolar disorder, no hx of mania. PHQ-9 score of 8 today, no SI.  -Start Prozac '20mg'$  daily -Obtain GAD-7 at next visit -Therapy resources provided with AVS -Return precautions discussed -Follow up in 4 weeks

## 2021-04-11 NOTE — Addendum Note (Signed)
Addended by: Alcus Dad on: 04/11/2021 02:46 PM   Modules accepted: Orders

## 2021-04-11 NOTE — Telephone Encounter (Signed)
Rx sent for Fluoxetine '20mg'$  capsules instead of tablets

## 2021-04-11 NOTE — Assessment & Plan Note (Signed)
Patient working on lifestyle modifications. Denies snoring at today's visit. Discussed she will likely still need a sleep study to rule out OSA, but it would be reasonable to postpone this to a future visit. She wishes to address her anxiety first.

## 2021-04-11 NOTE — Assessment & Plan Note (Signed)
Foot exam performed today and was normal. -Continue current regimen (Metformin '500mg'$  daily, Jardiance '10mg'$  daily) -Return in 1 month for diabetes visit (recheck A1c at that time) -Needs diabetic eye exam. She already has an optometrist.

## 2021-04-13 ENCOUNTER — Ambulatory Visit: Payer: Medicaid Other | Admitting: Registered"

## 2021-04-16 ENCOUNTER — Other Ambulatory Visit: Payer: Self-pay | Admitting: Family Medicine

## 2021-04-16 ENCOUNTER — Other Ambulatory Visit (HOSPITAL_COMMUNITY): Payer: Self-pay | Admitting: Cardiology

## 2021-04-17 ENCOUNTER — Other Ambulatory Visit: Payer: Medicaid Other

## 2021-04-17 ENCOUNTER — Other Ambulatory Visit: Payer: Self-pay

## 2021-04-17 DIAGNOSIS — E1169 Type 2 diabetes mellitus with other specified complication: Secondary | ICD-10-CM

## 2021-04-18 LAB — LIPID PANEL
Chol/HDL Ratio: 5.1 ratio — ABNORMAL HIGH (ref 0.0–4.4)
Cholesterol, Total: 152 mg/dL (ref 100–199)
HDL: 30 mg/dL — ABNORMAL LOW (ref >39)
LDL Chol Calc (NIH): 77 mg/dL (ref 0–99)
Triglycerides: 272 mg/dL — ABNORMAL HIGH (ref 0–149)
VLDL Cholesterol Cal: 45 mg/dL — ABNORMAL HIGH (ref 5–40)

## 2021-04-25 NOTE — Progress Notes (Signed)
ADVANCED HF CLINIC NOTE  Primary Care: Dr. Alcus Dad Primary HF Cardiologist: Dr. Aundra Dubin  HPI: 45 y/o female w/ h/o obesity, ADHD, anxiety, depression, hypothyroidism, GERD, PACs, new diagnosis of DM2, and systolic HF.  Presented to ED on 5/30 w/ complaints of 3 week h/o progressive DOE, LEE and abdominal fullness. Found to be in acute CHF. BNP 1388. CXR w/ mild congestive CHF w/ interstitial edema. EKG showed ST 120 bpm. HS trop 48>>50.  TSH 13.416. Free T4 WNL at 0.78. Chest CT negative for PE.   She was admitted for CHF and started on IV Lasix. Echo showed biventricular dysfunction. LVEF severely reduced 20-25% w/ global HK. Mild LVH. RV moderately reduced w/ estimated RVSP 36.1 mmHg. Also found to have new diagnosis of T2D, Hgb A1c 11. She responded well to diuresis and was transitioned to oral Lasix and discharged on GDMT, weight down 5 lbs.   Today she returns for HF follow up. Overall feeling better. Recently went hiking and HR was stable. Some SOB with increased physical activity. Dizziness improved after changing lasix to as needed. Denies CP, edema, or PND/Orthopnea. Appetite ok. No fever or chills. Weight at home 275 pounds. Taking all medications. 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   Cardiac Studies: - Echo (02/14/21): EF 20-25%, severe LV dysfunction, RV moderately reduced  - cMRI (5/22): LVEF 21%, diffuse hypokinesis, normal RV size with EF 30%, No definite myocardial LGE (though difficult images).   - L/RHC (6/22): No significant CAD, PCWP and LVEDP remain elevated, pulmonary venous hypertension, preserved CO, , nonischemic CM.  RHC Procedural Findings: Hemodynamics (mmHg) RA mean 6 RV 47/11 PA 53/20, mean 30 PCWP mean 20 LV 105/26 AO 104/79  Oxygen saturations: PA 66% AO 91%  Cardiac Output (Fick) 5.93  Cardiac Index (Fick) 2.74  PVR 1.7 WU  CO (Thermodilution) 6.93  CI (Thermodilution) 3.2 PVR 1.4 WU   ROS: All systems reviewed and  negative except as per HPI.   Past Medical History:  Diagnosis Date   ADHD (attention deficit hyperactivity disorder)    diagnosed at [facility] clinic.   Allergy    Anxiety    Social anxiety, generalized anxiety disorder.   Depression    Diabetes mellitus without complication (HCC)    HFrEF (heart failure with reduced ejection fraction) (HCC)    Hypertension    Current Outpatient Medications  Medication Sig Dispense Refill   acetaminophen (TYLENOL) 325 MG tablet Take 2 tablets (650 mg total) by mouth every 6 (six) hours as needed for mild pain.     amiodarone (PACERONE) 200 MG tablet Take 1 tablet (200 mg total) by mouth daily. 30 tablet 0   FLUoxetine (PROZAC) 20 MG capsule Take 1 capsule (20 mg total) by mouth daily. 30 capsule 1   furosemide (LASIX) 40 MG tablet Take 1 tablet (40 mg total) by mouth daily. (Patient taking differently: Take 40 mg by mouth daily. As needed) 30 tablet 0   JARDIANCE 10 MG TABS tablet Take 1 tablet by mouth once daily 30 tablet 0   metFORMIN (GLUCOPHAGE-XR) 500 MG 24 hr tablet Take 1 tablet by mouth once daily with breakfast 30 tablet 0   metoprolol succinate (TOPROL-XL) 25 MG 24 hr tablet Take 1 tablet by mouth once daily 30 tablet 3   potassium chloride SA (KLOR-CON) 20 MEQ tablet Take 1 tablet (20 mEq total) by mouth daily. (Patient taking differently: Take 20 mEq by mouth. Patient takes differently once a week as  needed.) 30 tablet 0   rosuvastatin (CRESTOR) 20 MG tablet Take 1 tablet (20 mg total) by mouth daily. 90 tablet 0   sacubitril-valsartan (ENTRESTO) 49-51 MG Take 1 tablet by mouth 2 (two) times daily. 60 tablet 6   spironolactone (ALDACTONE) 25 MG tablet Take 1 tablet by mouth once daily 30 tablet 2   No current facility-administered medications for this encounter.   Allergies  Allergen Reactions   Sulfa Antibiotics    Social History   Socioeconomic History   Marital status: Married    Spouse name: Not on file   Number of children: Not  on file   Years of education: Not on file   Highest education level: Not on file  Occupational History   Not on file  Tobacco Use   Smoking status: Former    Types: Cigarettes    Quit date: 01/12/2010    Years since quitting: 11.2   Smokeless tobacco: Never  Vaping Use   Vaping Use: Never used  Substance and Sexual Activity   Alcohol use: No   Drug use: No   Sexual activity: Not on file  Other Topics Concern   Not on file  Social History Narrative   Marital status: divorced and engaged since 2011.     Children: 2 children      Lives with fiance and two children (67, 10)      Employment: childcare early development; 3s and 4s. Surveyor, quantity      Tobacco: none      Alcohol: none      Drugs; None      Exercise: sporadic.           Social Determinants of Health   Financial Resource Strain: Medium Risk   Difficulty of Paying Living Expenses: Somewhat hard  Food Insecurity: No Food Insecurity   Worried About Charity fundraiser in the Last Year: Never true   Ran Out of Food in the Last Year: Never true  Transportation Needs: No Transportation Needs   Lack of Transportation (Medical): No   Lack of Transportation (Non-Medical): No  Physical Activity: Not on file  Stress: Not on file  Social Connections: Not on file  Intimate Partner Violence: Not on file   No family history of CHF or CAD.   BP 102/69   Pulse 73   Wt 125.6 kg (277 lb)   LMP 04/03/2021 (Exact Date)   SpO2 93%   BMI 54.10 kg/m   Wt Readings from Last 3 Encounters:  04/26/21 125.6 kg (277 lb)  04/10/21 126.5 kg (278 lb 12.8 oz)  03/29/21 126.9 kg (279 lb 12.8 oz)   PHYSICAL EXAM: General:  NAD. No resp difficulty HEENT: Normal Neck: Supple. No JVD. Carotids 2+ bilat; no bruits. No lymphadenopathy or thryomegaly appreciated. Cor: PMI nondisplaced. Regular rate & rhythm. No rubs, gallops or murmurs. Lungs: Clear Abdomen: Obese, nontender, nondistended. No hepatosplenomegaly. No bruits or  masses. Good bowel sounds. Extremities: No cyanosis, clubbing, rash, edema Neuro: Alert & oriented x 3, cranial nerves grossly intact. Moves all 4 extremities w/o difficulty. Affect pleasant.  ASSESSMENT & PLAN:  1. Chronic systolic CHF:  New onset cardiomyopathy of uncertain etiology. Echo this admission with EF 20-25%, moderately decreased RV systolic function.  LHC/RHC with preserved cardiac output, no significant coronary disease (nonischemic cardiomyopathy). Cardiac MRI with LV EF 21%, RV EF 30%, no myocardial LGE.  No ETOH, no drugs, no FH of CAD or CMP. Consider prior viral myocarditis, effects of diabetes  itself, or newly-diagnosed hypothyroidism.  However, patient also has had frequent episodes of rapid atrial tachycardia in the hospital and says that her HR has often been up to 150 bpm at home on her Apple Watch. I wonder if this is not a tachycardia-mediated CMP.  NYHA II today, she is not volume overloaded on exam. - Continue lasix 40 mg PRN - Continue Entresto 49/51 mg bid.  No BP room to increase. - Continue sprionolactone 25 mg daily. BMET today. - Continue Toprol XL 25 mg daily. - Continue Jardiance 10 mg daily. No GU symptoms. - Needs to maintain NSR. 2. Type 2 diabetes: New diagnosis, per PCP. A1c 11. On Jardiance. Watch for GU symptoms. 3. Hypothyroidism: New diagnosis, per PCP. TSH 8.9 (7/22). 4. Hyperlipidemia: On statin. TG 272, LDL 77.  - Would like to add Vascepa. We discussed this today and she is open to it, but would like to think about it.  5. Atrial tachycardia: Frequent episodes, recurred when amiodarone stopped.  On questioning, her HR has frequently been elevated as high as 150s at home though she does not feel palpitations.  I am concerned that she could have a tachy-mediated CMP from AT.  Now back in NSR on amiodarone. HR on watch has been stable. - Continue amiodarone 200 mg daily. - EP has seen, thinks her high BMI makes ablation less likely to be successful,  recommend continue amiodarone. - She will need regular eye exams. 6. Obesity: She is working on weight loss through dietary changes and increased activity. - Body mass index is 54.1 kg/m. 7. Snores: She says she snores. Sleep study would be helpful to evaluate for OSA.  - She said her PCP is going to order at her next appt.   Follow up in 6- 8 weeks with Dr. Aundra Dubin w/ echo.   - She would like to start taking herbal supplements. I have advised against this. Asked her to talk to provider or PharmD before starting any herbals/supplements, especially due to current HF medications and risk of potential reaction.  Allena Katz, FNP-BC 04/26/21

## 2021-04-26 ENCOUNTER — Encounter (HOSPITAL_COMMUNITY): Payer: Self-pay

## 2021-04-26 ENCOUNTER — Ambulatory Visit (HOSPITAL_COMMUNITY)
Admission: RE | Admit: 2021-04-26 | Discharge: 2021-04-26 | Disposition: A | Discharge status: Home / Self Care | Payer: Medicaid Other | Source: Ambulatory Visit | Attending: Family Medicine | Admitting: Family Medicine

## 2021-04-26 ENCOUNTER — Other Ambulatory Visit: Payer: Self-pay

## 2021-04-26 ENCOUNTER — Other Ambulatory Visit (HOSPITAL_COMMUNITY): Payer: Self-pay

## 2021-04-26 VITALS — BP 102/69 | HR 73 | Wt 277.0 lb

## 2021-04-26 DIAGNOSIS — E039 Hypothyroidism, unspecified: Secondary | ICD-10-CM | POA: Insufficient documentation

## 2021-04-26 DIAGNOSIS — E119 Type 2 diabetes mellitus without complications: Secondary | ICD-10-CM | POA: Diagnosis not present

## 2021-04-26 DIAGNOSIS — I5020 Unspecified systolic (congestive) heart failure: Secondary | ICD-10-CM | POA: Diagnosis not present

## 2021-04-26 DIAGNOSIS — R0683 Snoring: Secondary | ICD-10-CM | POA: Diagnosis not present

## 2021-04-26 DIAGNOSIS — Z79899 Other long term (current) drug therapy: Secondary | ICD-10-CM | POA: Diagnosis not present

## 2021-04-26 DIAGNOSIS — F419 Anxiety disorder, unspecified: Secondary | ICD-10-CM | POA: Insufficient documentation

## 2021-04-26 DIAGNOSIS — I5022 Chronic systolic (congestive) heart failure: Secondary | ICD-10-CM | POA: Insufficient documentation

## 2021-04-26 DIAGNOSIS — E669 Obesity, unspecified: Secondary | ICD-10-CM | POA: Insufficient documentation

## 2021-04-26 DIAGNOSIS — Z7984 Long term (current) use of oral hypoglycemic drugs: Secondary | ICD-10-CM | POA: Insufficient documentation

## 2021-04-26 DIAGNOSIS — Z87891 Personal history of nicotine dependence: Secondary | ICD-10-CM | POA: Diagnosis not present

## 2021-04-26 DIAGNOSIS — E785 Hyperlipidemia, unspecified: Secondary | ICD-10-CM | POA: Insufficient documentation

## 2021-04-26 DIAGNOSIS — I110 Hypertensive heart disease with heart failure: Secondary | ICD-10-CM | POA: Diagnosis not present

## 2021-04-26 DIAGNOSIS — I429 Cardiomyopathy, unspecified: Secondary | ICD-10-CM | POA: Insufficient documentation

## 2021-04-26 DIAGNOSIS — I471 Supraventricular tachycardia: Secondary | ICD-10-CM | POA: Diagnosis not present

## 2021-04-26 DIAGNOSIS — Z6841 Body Mass Index (BMI) 40.0 and over, adult: Secondary | ICD-10-CM | POA: Diagnosis not present

## 2021-04-26 LAB — BASIC METABOLIC PANEL
Anion gap: 7 (ref 5–15)
BUN: 20 mg/dL (ref 6–20)
CO2: 23 mmol/L (ref 22–32)
Calcium: 9.3 mg/dL (ref 8.9–10.3)
Chloride: 105 mmol/L (ref 98–111)
Creatinine, Ser: 0.97 mg/dL (ref 0.44–1.00)
GFR, Estimated: >60 mL/min (ref >60)
Glucose, Bld: 120 mg/dL — ABNORMAL HIGH (ref 70–99)
Potassium: 4.5 mmol/L (ref 3.5–5.1)
Sodium: 135 mmol/L (ref 135–145)

## 2021-04-26 NOTE — Patient Instructions (Signed)
It was great to see you today! No medication changes are needed at this time.   Labs today We will only contact you if something comes back abnormal or we need to make some changes. Otherwise no news is good news!  Your physician recommends that you schedule a follow-up appointment in: 6-8 weeks with Dr Aundra Dubin and echo  Your physician has requested that you have an echocardiogram. Echocardiography is a painless test that uses sound waves to create images of your heart. It provides your doctor with information about the size and shape of your heart and how well your heart's chambers and valves are working. This procedure takes approximately one hour. There are no restrictions for this procedure.  Do the following things EVERYDAY: Weigh yourself in the morning before breakfast. Write it down and keep it in a log. Take your medicines as prescribed Eat low salt foods--Limit salt (sodium) to 2000 mg per day.  Stay as active as you can everyday Limit all fluids for the day to less than 2 liters  At the [facility], you and your health needs are our priority. As part of our continuing mission to provide you with exceptional heart care, we have created designated Provider Care Teams. These Care Teams include your primary Cardiologist (physician) and Advanced Practice Providers (APPs- Physician Assistants and Nurse Practitioners) who all work together to provide you with the care you need, when you need it.   You may see any of the following providers on your designated Care Team at your next follow up: Dr Glori Bickers Dr Loralie Champagne Dr Patrice Paradise, NP Lyda Jester, [place] Ginnie Smart Audry Riles, PharmD   Please be sure to bring in all your medications bottles to every appointment.   If you have any questions or concerns before your next appointment please send [place] a message through [place] or call our office at [phone].    TO LEAVE A  MESSAGE FOR THE NURSE SELECT OPTION 2, PLEASE LEAVE A MESSAGE INCLUDING: YOUR NAME DATE OF BIRTH CALL BACK NUMBER REASON FOR CALL**this is important as we prioritize the call backs  YOU WILL RECEIVE A CALL BACK THE SAME DAY AS LONG AS YOU CALL BEFORE 4:00 PM

## 2021-04-28 ENCOUNTER — Telehealth: Payer: Self-pay

## 2021-04-28 NOTE — Telephone Encounter (Signed)
Patient calls nurse line reporting insomnia with Fluoxetine. Patient reports she had insomnia "a little bit" prior, however is much worse with new medication. Patient reports she has been taking since 7/26. Patient denies any other symptoms of worsening anxiety. Patient has an apt with PCP on 8/29. Patient would like to transition to a different medication before then. Please advise.

## 2021-05-01 ENCOUNTER — Other Ambulatory Visit (HOSPITAL_COMMUNITY): Payer: Self-pay | Admitting: *Deleted

## 2021-05-01 ENCOUNTER — Encounter (HOSPITAL_COMMUNITY): Payer: Self-pay

## 2021-05-01 MED ORDER — AMIODARONE HCL 200 MG PO TABS: 200.0000 mg | ORAL_TABLET | Freq: Every day | ORAL | 3 refills | Status: DC

## 2021-05-01 NOTE — Telephone Encounter (Signed)
Patient calls nurse line reporting she does not want to change current Prozac regime. Patient reports she has been going through a stressful situation that has now resolved. Patient reports her sleep is back to normal. Patient will FU on 8/29 as scheduled.

## 2021-05-14 ENCOUNTER — Other Ambulatory Visit: Payer: Self-pay | Admitting: Family Medicine

## 2021-05-15 ENCOUNTER — Ambulatory Visit: Payer: Medicaid Other | Admitting: Family Medicine

## 2021-05-25 ENCOUNTER — Telehealth: Payer: Medicaid Other | Admitting: Physician Assistant

## 2021-05-25 DIAGNOSIS — J208 Acute bronchitis due to other specified organisms: Secondary | ICD-10-CM

## 2021-05-25 DIAGNOSIS — J019 Acute sinusitis, unspecified: Secondary | ICD-10-CM

## 2021-05-25 DIAGNOSIS — B9689 Other specified bacterial agents as the cause of diseases classified elsewhere: Secondary | ICD-10-CM

## 2021-05-25 MED ORDER — DOXYCYCLINE HYCLATE 100 MG PO TABS: 100.0000 mg | ORAL_TABLET | Freq: Two times a day (BID) | ORAL | 0 refills | Status: DC

## 2021-05-25 MED ORDER — BENZONATATE 100 MG PO CAPS: 100.0000 mg | ORAL_CAPSULE | Freq: Three times a day (TID) | ORAL | 0 refills | Status: DC | PRN

## 2021-05-25 NOTE — Progress Notes (Signed)
Virtual Visit Consent   Vickie Graham, you are scheduled for a virtual visit with a [facility] provider today.     Just as with appointments in the office, your consent must be obtained to participate.  Your consent will be active for this visit and any virtual visit you may have with one of our providers in the next 365 days.     If you have a MyChart account, a copy of this consent can be sent to you electronically.  All virtual visits are billed to your insurance company just like a traditional visit in the office.    As this is a virtual visit, video technology does not allow for your provider to perform a traditional examination.  This may limit your provider's ability to fully assess your condition.  If your provider identifies any concerns that need to be evaluated in person or the need to arrange testing (such as labs, EKG, etc.), we will make arrangements to do so.     Although advances in technology are sophisticated, we cannot ensure that it will always work on either your end or our end.  If the connection with a video visit is poor, the visit may have to be switched to a telephone visit.  With either a video or telephone visit, we are not always able to ensure that we have a secure connection.     I need to obtain your verbal consent now.   Are you willing to proceed with your visit today?    TASHAWN GRESSEL has provided verbal consent on 05/25/2021 for a virtual visit (video or telephone).   Mar Daring, PA-C   Date: 05/25/2021 10:27 AM   Virtual Visit via Video Note   I, Mar Daring, connected with  Vickie Graham  ([id number], 09-May-1976) on 05/25/21 at 10:15 AM EDT by a video-enabled telemedicine application and verified that I am speaking with the correct person using two identifiers.  Location: Patient: Virtual Visit Location Patient: Other: work, isolated Provider: Scientist, research (medical) Provider: Home Office   I discussed the limitations of evaluation and  management by telemedicine and the availability of in person appointments. The patient expressed understanding and agreed to proceed.    History of Present Illness: Vickie Graham is a 45 y.o. who identifies as a female who was assigned female at birth, and is being seen today for cough.  HPI: Cough This is a new problem. The current episode started 1 to 4 weeks ago (over 2 weeks). The problem has been gradually worsening. The problem occurs constantly. The cough is Productive of sputum. Associated symptoms include chills, a fever (at the very beginning), headaches, myalgias, nasal congestion, postnasal drip, rhinorrhea and a sore throat (last week). Pertinent negatives include no ear congestion, ear pain, shortness of breath or wheezing. Associated symptoms comments: Fatigue. Treatments tried: nyquil. The treatment provided no relief. Her past medical history is significant for bronchitis. There is no history of asthma or pneumonia.  Started to feel better over the weekend and through early this week, then last night started to feel worse again and today symptoms have continued to progress.  Covid negative   Problems:  Patient Active Problem List   Diagnosis Date Noted   Anxiety 04/11/2021   Hyperlipidemia associated with type 2 diabetes mellitus ([place]) 04/11/2021   Sinusitis 03/15/2021   HFrEF (heart failure with reduced ejection fraction) ([place]) 03/03/2021   Type 2 diabetes mellitus without complications ([place]) [id number]  Atrial tachycardia (HCC)    Acute systolic CHF (congestive heart failure) ([place])    Hypothyroidism    Morbid obesity ([place]) 12/13/2013    Allergies:  Allergies  Allergen Reactions   Sulfa Antibiotics    Medications:  Current Outpatient Medications:    benzonatate (TESSALON) 100 MG capsule, Take 1 capsule (100 mg total) by mouth 3 (three) times daily as needed., Disp: 30 capsule, Rfl: 0   doxycycline (VIBRA-TABS) 100 MG tablet, Take 1 tablet (100 mg total) by mouth 2  (two) times daily., Disp: 20 tablet, Rfl: 0   JARDIANCE 10 MG TABS tablet, Take 1 tablet by mouth once daily, Disp: 30 tablet, Rfl: 2   metFORMIN (GLUCOPHAGE-XR) 500 MG 24 hr tablet, Take 1 tablet by mouth once daily with breakfast, Disp: 30 tablet, Rfl: 2   acetaminophen (TYLENOL) 325 MG tablet, Take 2 tablets (650 mg total) by mouth every 6 (six) hours as needed for mild pain., Disp: , Rfl:    amiodarone (PACERONE) 200 MG tablet, Take 1 tablet (200 mg total) by mouth daily., Disp: 30 tablet, Rfl: 3   FLUoxetine (PROZAC) 20 MG capsule, Take 1 capsule (20 mg total) by mouth daily., Disp: 30 capsule, Rfl: 1   furosemide (LASIX) 40 MG tablet, Take 1 tablet (40 mg total) by mouth daily. (Patient taking differently: Take 40 mg by mouth daily. As needed), Disp: 30 tablet, Rfl: 0   metoprolol succinate (TOPROL-XL) 25 MG 24 hr tablet, Take 1 tablet by mouth once daily, Disp: 30 tablet, Rfl: 3   potassium chloride SA (KLOR-CON) 20 MEQ tablet, Take 1 tablet (20 mEq total) by mouth daily. (Patient taking differently: Take 20 mEq by mouth. Patient takes differently once a week as needed.), Disp: 30 tablet, Rfl: 0   rosuvastatin (CRESTOR) 20 MG tablet, Take 1 tablet (20 mg total) by mouth daily., Disp: 90 tablet, Rfl: 0   sacubitril-valsartan (ENTRESTO) 49-51 MG, Take 1 tablet by mouth 2 (two) times daily., Disp: 60 tablet, Rfl: 6   spironolactone (ALDACTONE) 25 MG tablet, Take 1 tablet by mouth once daily, Disp: 30 tablet, Rfl: 2  Observations/Objective: Patient is well-developed, well-nourished in no acute distress.  Resting comfortably at home.  Head is normocephalic, atraumatic.  No labored breathing.  Speech is clear and coherent with logical content.  Patient is alert and oriented at baseline.    Assessment and Plan: 1. Acute bacterial bronchitis - doxycycline (VIBRA-TABS) 100 MG tablet; Take 1 tablet (100 mg total) by mouth 2 (two) times daily.  Dispense: 20 tablet; Refill: 0 - benzonatate  (TESSALON) 100 MG capsule; Take 1 capsule (100 mg total) by mouth 3 (three) times daily as needed.  Dispense: 30 capsule; Refill: 0  2. Acute bacterial sinusitis - doxycycline (VIBRA-TABS) 100 MG tablet; Take 1 tablet (100 mg total) by mouth 2 (two) times daily.  Dispense: 20 tablet; Refill: 0 - benzonatate (TESSALON) 100 MG capsule; Take 1 capsule (100 mg total) by mouth 3 (three) times daily as needed.  Dispense: 30 capsule; Refill: 0  - Worsening symptoms consistent with second sickening - Will treat with Doxycycline and tessalon perles as above - Rest as allowed - Push fluids - Seek in person evaluation if symptoms continue to worsen or fail to improve  Follow Up Instructions: I discussed the assessment and treatment plan with the patient. The patient was provided an opportunity to ask questions and all were answered. The patient agreed with the plan and demonstrated an understanding of the instructions.  A  copy of instructions were sent to the patient via MyChart.  The patient was advised to call back or seek an in-person evaluation if the symptoms worsen or if the condition fails to improve as anticipated.  Time:  I spent 12 minutes with the patient via telehealth technology discussing the above problems/concerns.    Mar Daring, PA-C

## 2021-05-25 NOTE — Patient Instructions (Signed)
Vickie Graham, thank you for joining Mar Daring, PA-C for today's virtual visit.  While this provider is not your primary care provider (PCP), if your PCP is located in our provider database this encounter information will be shared with them immediately following your visit.  Consent: (Patient) Vickie Graham verbal consent for this virtual visit at the beginning of the encounter.  Current Medications:  Current Outpatient Medications:    benzonatate (TESSALON) 100 MG capsule, Take 1 capsule (100 mg total) by mouth 3 (three) times daily as needed., Disp: 30 capsule, Rfl: 0   doxycycline (VIBRA-TABS) 100 MG tablet, Take 1 tablet (100 mg total) by mouth 2 (two) times daily., Disp: 20 tablet, Rfl: 0   JARDIANCE 10 MG TABS tablet, Take 1 tablet by mouth once daily, Disp: 30 tablet, Rfl: 2   metFORMIN (GLUCOPHAGE-XR) 500 MG 24 hr tablet, Take 1 tablet by mouth once daily with breakfast, Disp: 30 tablet, Rfl: 2   acetaminophen (TYLENOL) 325 MG tablet, Take 2 tablets (650 mg total) by mouth every 6 (six) hours as needed for mild pain., Disp: , Rfl:    amiodarone (PACERONE) 200 MG tablet, Take 1 tablet (200 mg total) by mouth daily., Disp: 30 tablet, Rfl: 3   FLUoxetine (PROZAC) 20 MG capsule, Take 1 capsule (20 mg total) by mouth daily., Disp: 30 capsule, Rfl: 1   furosemide (LASIX) 40 MG tablet, Take 1 tablet (40 mg total) by mouth daily. (Patient taking differently: Take 40 mg by mouth daily. As needed), Disp: 30 tablet, Rfl: 0   metoprolol succinate (TOPROL-XL) 25 MG 24 hr tablet, Take 1 tablet by mouth once daily, Disp: 30 tablet, Rfl: 3   potassium chloride SA (KLOR-CON) 20 MEQ tablet, Take 1 tablet (20 mEq total) by mouth daily. (Patient taking differently: Take 20 mEq by mouth. Patient takes differently once a week as needed.), Disp: 30 tablet, Rfl: 0   rosuvastatin (CRESTOR) 20 MG tablet, Take 1 tablet (20 mg total) by mouth daily., Disp: 90 tablet, Rfl: 0    sacubitril-valsartan (ENTRESTO) 49-51 MG, Take 1 tablet by mouth 2 (two) times daily., Disp: 60 tablet, Rfl: 6   spironolactone (ALDACTONE) 25 MG tablet, Take 1 tablet by mouth once daily, Disp: 30 tablet, Rfl: 2   Medications ordered in this encounter:  Meds ordered this encounter  Medications   doxycycline (VIBRA-TABS) 100 MG tablet    Sig: Take 1 tablet (100 mg total) by mouth 2 (two) times daily.    Dispense:  20 tablet    Refill:  0    Order Specific Question:   Supervising Provider    Answer:   MILLER, BRIAN [[id number]]   benzonatate (TESSALON) 100 MG capsule    Sig: Take 1 capsule (100 mg total) by mouth 3 (three) times daily as needed.    Dispense:  30 capsule    Refill:  0    Order Specific Question:   Supervising Provider    Answer:   Sabra Heck, [place]     *If you need refills on other medications prior to your next appointment, please contact your pharmacy*  Follow-Up: Call back or seek an in-person evaluation if the symptoms worsen or if the condition fails to improve as anticipated.  Other Instructions Sinusitis, Adult Sinusitis is soreness and swelling (inflammation) of your sinuses. Sinuses are hollow spaces in the bones around your face. They are located: Around your eyes. In the middle of your forehead. Behind your nose. In your cheekbones.  Your sinuses and nasal passages are lined with a fluid called mucus. Mucus drains out of your sinuses. Swelling can trap mucus in your sinuses. This lets germs (bacteria, virus, or fungus) grow, which leads to infection. Most of the time, this condition is caused by a virus. What are the causes? This condition is caused by: Allergies. Asthma. Germs. Things that block your nose or sinuses. Growths in the nose (nasal polyps). Chemicals or irritants in the air. Fungus (rare). What increases the risk? You are more likely to develop this condition if: You have a weak body defense system (immune system). You do a lot of  swimming or diving. You use nasal sprays too much. You smoke. What are the signs or symptoms? The main symptoms of this condition are pain and a feeling of pressure around the sinuses. Other symptoms include: Stuffy nose (congestion). Runny nose (drainage). Swelling and warmth in the sinuses. Headache. Toothache. A cough that may get worse at night. Mucus that collects in the throat or the back of the nose (postnasal drip). Being unable to smell and taste. Being very tired (fatigue). A fever. Sore throat. Bad breath. How is this diagnosed? This condition is diagnosed based on: Your symptoms. Your medical history. A physical exam. Tests to find out if your condition is short-term (acute) or long-term (chronic). Your doctor may: Check your nose for growths (polyps). Check your sinuses using a tool that has a light (endoscope). Check for allergies or germs. Do imaging tests, such as an MRI or CT scan. How is this treated? Treatment for this condition depends on the cause and whether it is short-term or long-term. If caused by a virus, your symptoms should go away on their own within 10 days. You may be given medicines to relieve symptoms. They include: Medicines that shrink swollen tissue in the nose. Medicines that treat allergies (antihistamines). A spray that treats swelling of the nostrils.  Rinses that help get rid of thick mucus in your nose (nasal saline washes). If caused by bacteria, your doctor may wait to see if you will get better without treatment. You may be given antibiotic medicine if you have: A very bad infection. A weak body defense system. If caused by growths in the nose, you may need to have surgery. Follow these instructions at home: Medicines Take, use, or apply over-the-counter and prescription medicines only as told by your doctor. These may include nasal sprays. If you were prescribed an antibiotic medicine, take it as told by your doctor. Do not stop  taking the antibiotic even if you start to feel better. Hydrate and humidify  Drink enough water to keep your pee (urine) pale yellow. Use a cool mist humidifier to keep the humidity level in your home above 50%. Breathe in steam for 10-15 minutes, 3-4 times a day, or as told by your doctor. You can do this in the bathroom while a hot shower is running. Try not to spend time in cool or dry air. Rest Rest as much as you can. Sleep with your head raised (elevated). Make sure you get enough sleep each night. General instructions  Put a warm, moist washcloth on your face 3-4 times a day, or as often as told by your doctor. This will help with discomfort. Wash your hands often with soap and water. If there is no soap and water, use hand sanitizer. Do not smoke. Avoid being around people who are smoking (secondhand smoke). Keep all follow-up visits as told by your  doctor. This is important. Contact a doctor if: You have a fever. Your symptoms get worse. Your symptoms do not get better within 10 days. Get help right away if: You have a very bad headache. You cannot stop throwing up (vomiting). You have very bad pain or swelling around your face or eyes. You have trouble seeing. You feel confused. Your neck is stiff. You have trouble breathing. Summary Sinusitis is swelling of your sinuses. Sinuses are hollow spaces in the bones around your face. This condition is caused by tissues in your nose that become inflamed or swollen. This traps germs. These can lead to infection. If you were prescribed an antibiotic medicine, take it as told by your doctor. Do not stop taking it even if you start to feel better. Keep all follow-up visits as told by your doctor. This is important. This information is not intended to replace advice given to you by your health care provider. Make sure you discuss any questions you have with your health care provider. Document Revised: 02/03/2018 Document Reviewed:  02/03/2018 Elsevier Patient Education  2022 [employer].   Acute Bronchitis, Adult Acute bronchitis is when air tubes in the lungs (bronchi) suddenly get swollen. The condition can make it hard for you to breathe. In adults, acute bronchitis usually goes away within 2 weeks. A cough caused by bronchitis may last up to 3 weeks. Smoking, allergies, and asthma can make the condition worse. What are the causes? This condition is caused by: Cold and flu viruses. The most common cause of this condition is the virus that causes the common cold. Bacteria. Substances that irritate the lungs, including: Smoke from cigarettes and other types of tobacco. Dust and pollen. Fumes from chemicals, gases, or burned fuel. Other materials that pollute indoor or outdoor air. Close contact with someone who has acute bronchitis. What increases the risk? The following factors may make you more likely to develop this condition: A weak body's defense system. This is also called the immune system. Any condition that affects your lungs and breathing, such as asthma. What are the signs or symptoms? Symptoms of this condition include: A cough. Coughing up clear, yellow, or green mucus. Wheezing. Having too much mucus in your lungs (chest congestion). Shortness of breath. A fever. Chills. Body aches. A sore throat. How is this treated? Acute bronchitis may go away over time without treatment. Your doctor may recommend: Drinking more fluids. Using a device that gets medicine into your lungs (inhaler). Using a vaporizer or a humidifier. These are machines that add water or moisture to the air. This helps with coughing and poor breathing. Taking a medicine for fever. Taking a medicine that thins mucus and clears congestion. Taking a medicine that prevents or stops coughing. Follow these instructions at home: Activity Get a lot of rest. Return to your normal activities as told by your doctor. Ask your  doctor what activities are safe for you. Lifestyle  Drink enough fluid to keep your pee (urine) pale yellow. Do not drink alcohol. Do not use any products that contain nicotine or tobacco, such as cigarettes, e-cigarettes, and chewing tobacco. If you need help quitting, ask your doctor. Be aware that: Your bronchitis will get worse if you smoke or breathe in other people's smoke (secondhand smoke). Your lungs will heal faster if you quit smoking. General instructions Take over-the-counter and prescription medicines only as told by your doctor. Use an inhaler, cool mist vaporizer, or humidifier as told by your doctor. Rinse your  mouth often with salt water. To make salt water, dissolve -1 tsp (3-6 g) of salt in 1 cup (237 mL) of warm water. Take two teaspoons of honey at bedtime. This helps lessen your coughing at night. Keep all follow-up visits as told by your doctor. This is important. How is this prevented? To lower your risk of getting this condition again: Wash your hands often with soap and water. If you cannot use soap and water, use hand sanitizer. Avoid contact with people who have cold symptoms. Try not to touch your mouth, nose, or eyes with your hands. Make sure to get the flu shot every year. Contact a doctor if: Your symptoms do not get better in 2 weeks. You vomit more than once or twice. You have symptoms of loss of fluid from your body (dehydration). These include: Dark pee. Dry skin or eyes. Increased thirst. Headaches. Confusion. Muscle cramps. Get help right away if: You cough up blood. You have chest pain. You have very bad shortness of breath. You become dehydrated. You faint or keep feeling like you are going to faint. You have a very bad headache. Your fever or chills get worse. These symptoms may be an emergency. Get help right away. Call your local emergency services (911 in the [place] Do not wait to see if the symptoms will go away. Do not drive  yourself to the hospital. Summary Acute bronchitis is when air tubes in the lungs (bronchi) suddenly get swollen. In adults, acute bronchitis usually goes away within 2 weeks. Take over-the-counter and prescription medicines only as told by your doctor. Drink enough fluid to keep your pee (urine) pale yellow. Contact a doctor if your symptoms do not improve after 2 weeks of treatment. Get help right away if you cough up blood, faint, or have chest pain or shortness of breath. This information is not intended to replace advice given to you by your health care provider. Make sure you discuss any questions you have with your health care provider. Document Revised: 08/03/2020 Document Reviewed: 03/27/2019 Elsevier Patient Education  2022 [employer].    If you have been instructed to have an in-person evaluation today at a local Urgent Care facility, please use the link below. It will take you to a list of all of our available [facility], including address, phone number and hours of operation. Please do not delay care.  [facility]  If you or a family member do not have a primary care provider, use the link below to schedule a visit and establish care. When you choose a [facility] primary care physician or advanced practice provider, you gain a long-term partner in health. Find a Primary Care Provider  Learn more about [facility] in-office and virtual care options: [facility]

## 2021-05-27 ENCOUNTER — Other Ambulatory Visit (HOSPITAL_COMMUNITY): Payer: Self-pay | Admitting: Cardiology

## 2021-05-29 ENCOUNTER — Ambulatory Visit: Payer: Medicaid Other

## 2021-06-02 ENCOUNTER — Other Ambulatory Visit: Payer: Self-pay

## 2021-06-02 ENCOUNTER — Ambulatory Visit: Payer: Medicaid Other | Admitting: Family Medicine

## 2021-06-02 ENCOUNTER — Encounter: Payer: Self-pay | Admitting: Family Medicine

## 2021-06-02 VITALS — BP 111/62 | HR 65 | Ht 60.0 in | Wt 273.0 lb

## 2021-06-02 DIAGNOSIS — E119 Type 2 diabetes mellitus without complications: Secondary | ICD-10-CM | POA: Diagnosis present

## 2021-06-02 DIAGNOSIS — Z23 Encounter for immunization: Secondary | ICD-10-CM

## 2021-06-02 DIAGNOSIS — F419 Anxiety disorder, unspecified: Secondary | ICD-10-CM

## 2021-06-02 LAB — POCT GLYCOSYLATED HEMOGLOBIN (HGB A1C): HbA1c, POC (controlled diabetic range): 7.1 % — AB (ref 0.0–7.0)

## 2021-06-02 MED ORDER — METFORMIN HCL ER 500 MG PO TB24: 500.0000 mg | ORAL_TABLET | Freq: Two times a day (BID) | ORAL | 2 refills | Status: DC

## 2021-06-02 MED ORDER — FLUOXETINE HCL 40 MG PO CAPS: 40.0000 mg | ORAL_CAPSULE | Freq: Every day | ORAL | 1 refills | Status: DC

## 2021-06-02 NOTE — Patient Instructions (Addendum)
It was great to see you!  -Your A1c was 7.1% today, which is excellent! Your goal A1c is 7.0% or below. -We will increase your Metformin to twice daily. Some people experience upset stomach with an increased dose. This should subside in a week or so. If you're having issues beyond this, we can always go back to once daily -For your anxiety, we will increase your Prozac to '40mg'$  daily. You can take two of your '20mg'$  capsules until a new prescription is filled. -Today we did your pneumonia and flu vaccines -Be sure to schedule a diabetic eye exam with your eye doctor! This is your homework before your next visit.  Please schedule an appointment for a pap smear at your earliest convenience. Otherwise, I will see you again in 3 months to check in on your diabetes, anxiety, etc.  Take care and seek immediate care sooner if you develop any concerns.  Dr. [facility]

## 2021-06-02 NOTE — Progress Notes (Signed)
    SUBJECTIVE:   CHIEF COMPLAINT / HPI:   Type 2 Diabetes Diagnosed May 2022. A1c 11.0% at that time. Current diabetes meds: Metformin '500mg'$  daily, Jardiance '10mg'$  daily. Excellent medication compliance. Ozempic previously denied by insurance. Checks fasting sugars daily at home- 120s mostly, 140s-150s when she was sick, lowest has been 114  Due for eye exam-- states she has an eye doctor and will get this done  Anxiety Patient reports continued anxiety. Currently taking fluoxetine '20mg'$  daily. Only sees slight benefit. Interested in trying increased dose. Patient feels majority of her health issues are stress/anxiety related.   Obesity, HFrEF Patient needs sleep study. It has been deferred at prior visits. She declines again today and would like to wait to see what her repeat echo shows next month.   PERTINENT  PMH / PSH: HFrEF, atrial tachycardia, anxiety, HLD, obesity  OBJECTIVE:   BP 111/62   Pulse 65   Ht 5' (1.524 m)   Wt 273 lb (123.8 kg)   LMP 06/02/2021 (Exact Date)   SpO2 99%   BMI 53.32 kg/m   General: NAD, pleasant, able to participate in exam Cardiac: RRR, S1 S2 present. normal heart sounds, no murmurs. Respiratory: CTAB, normal effort, No wheezes, rales or rhonchi Extremities: no edema or cyanosis. Skin: warm and dry, no rashes noted Neuro: alert, no obvious focal deficits Psych: Normal affect and mood   ASSESSMENT/PLAN:   Type 2 diabetes mellitus without complications (HCC) [id number] today, which is excellent considering it was 11.0% four months ago. She would benefit from tight glycemic control given her young age and relatively new diagnosis. Goal A1c 6.5%. Ideally would be on Ozempic to promote weight loss, but this was denied by her insurance -Increase Metformin to '500mg'$  BID -Continue Jardiance '10mg'$  daily -Continue statin and ARB -Patient will schedule eye exam -UTD on foot exam -Ongoing lifestyle modifications, continue working toward healthier  BMI  Anxiety Mildly improved on Fluoxetine '20mg'$  daily, but still with persistent anxiety. -Increase fluoxetine to '40mg'$  daily -Encouraged therapy  Health Maintenance -Given PCV20 and flu vaccine today -Patient to make separate appt for her Pap  Will continue to address the need for sleep study and vascepa at future visits   Alcus Dad, MD [facility]

## 2021-06-03 NOTE — Assessment & Plan Note (Signed)
Mildly improved on Fluoxetine '20mg'$  daily, but still with persistent anxiety. -Increase fluoxetine to '40mg'$  daily -Encouraged therapy

## 2021-06-03 NOTE — Assessment & Plan Note (Addendum)
A1c 7.1% today, which is excellent considering it was 11.0% four months ago. She would benefit from tight glycemic control given her young age and relatively new diagnosis. Goal A1c 6.5%. Ideally would be on Ozempic to promote weight loss, but this was denied by her insurance -Increase Metformin to '500mg'$  BID -Continue Jardiance '10mg'$  daily -Continue statin and ARB -Patient will schedule eye exam -UTD on foot exam -Ongoing lifestyle modifications, continue working toward healthier BMI

## 2021-06-11 ENCOUNTER — Other Ambulatory Visit: Payer: Self-pay | Admitting: Family Medicine

## 2021-06-13 ENCOUNTER — Other Ambulatory Visit: Payer: Self-pay

## 2021-06-13 ENCOUNTER — Telehealth (HOSPITAL_COMMUNITY): Payer: Self-pay | Admitting: Cardiology

## 2021-06-13 ENCOUNTER — Ambulatory Visit (INDEPENDENT_AMBULATORY_CARE_PROVIDER_SITE_OTHER): Payer: Medicaid Other

## 2021-06-13 ENCOUNTER — Encounter: Payer: Self-pay | Admitting: Emergency Medicine

## 2021-06-13 ENCOUNTER — Ambulatory Visit
Admission: EM | Admit: 2021-06-13 | Discharge: 2021-06-13 | Disposition: A | Discharge status: Home / Self Care | Payer: Medicaid Other | Attending: Urgent Care | Admitting: Urgent Care

## 2021-06-13 DIAGNOSIS — R609 Edema, unspecified: Secondary | ICD-10-CM

## 2021-06-13 DIAGNOSIS — E119 Type 2 diabetes mellitus without complications: Secondary | ICD-10-CM

## 2021-06-13 DIAGNOSIS — Z8709 Personal history of other diseases of the respiratory system: Secondary | ICD-10-CM

## 2021-06-13 DIAGNOSIS — I471 Supraventricular tachycardia: Secondary | ICD-10-CM

## 2021-06-13 DIAGNOSIS — R5383 Other fatigue: Secondary | ICD-10-CM

## 2021-06-13 DIAGNOSIS — F419 Anxiety disorder, unspecified: Secondary | ICD-10-CM

## 2021-06-13 DIAGNOSIS — I5020 Unspecified systolic (congestive) heart failure: Secondary | ICD-10-CM

## 2021-06-13 DIAGNOSIS — I4719 Other supraventricular tachycardia: Secondary | ICD-10-CM

## 2021-06-13 MED ORDER — FUROSEMIDE 80 MG PO TABS: 80.0000 mg | ORAL_TABLET | Freq: Every day | ORAL | 0 refills | Status: DC

## 2021-06-13 NOTE — Telephone Encounter (Signed)
Patient left voice mail on triage line with concerns of increased fatigue, confusion, lack of focus.  At the return of the call patient was at the urgent care center- CXR pending  Patient reports weight has been going up and down. Weight today 264. Denied SOB, swelling or CP however did notice abdominal distention/swelling as she has had in the past.    Advised above symptoms dont sound 100% in nature however could review with provider-add on appt for 06/15/21, can cancel if not needed

## 2021-06-13 NOTE — ED Triage Notes (Signed)
New fatigue starting Saturday. Denies pain. A week prior to that had her metoformin and prozac dosages doubled. Also has a hx of CHF. States so tired she can barely stay awake to drive her car. Recently finished an antibiotic for a sinus infection.

## 2021-06-13 NOTE — ED Provider Notes (Signed)
[place]   MRN: [id number] DOB: Nov 20, 1975  Subjective:   Vickie Graham is a 45 y.o. female presenting for 3-day history of new onset fatigue or swelling she is more active.  Also feels more sleepy, feels like she is having a hard time staying awake.  Has a history of anxiety, heart failure with reduced ejection fraction.  Last echocardiogram showed 20 to 25% EF in May 2022.  She has a cardiology appointment coming up in October.  She is currently on Lasix of 40 mg once daily.  For anxiety she does take Prozac, was just doubled in her dose.  Reports that initially when this happened she felt some fatigue but improved up until this past weekend.  Denies fever, runny or stuffy nose, sore throat, chest pain, diaphoresis, nausea, vomiting, abdominal pain.  Patient took an at-home COVID test and was negative.  Does not want another one done here.  No current facility-administered medications for this encounter.  Current Outpatient Medications:    JARDIANCE 10 MG TABS tablet, Take 1 tablet by mouth once daily, Disp: 30 tablet, Rfl: 2   acetaminophen (TYLENOL) 325 MG tablet, Take 2 tablets (650 mg total) by mouth every 6 (six) hours as needed for mild pain., Disp: , Rfl:    amiodarone (PACERONE) 200 MG tablet, Take 1 tablet (200 mg total) by mouth daily., Disp: 30 tablet, Rfl: 3   benzonatate (TESSALON) 100 MG capsule, Take 1 capsule (100 mg total) by mouth 3 (three) times daily as needed., Disp: 30 capsule, Rfl: 0   doxycycline (VIBRA-TABS) 100 MG tablet, Take 1 tablet (100 mg total) by mouth 2 (two) times daily., Disp: 20 tablet, Rfl: 0   FLUoxetine (PROZAC) 20 MG capsule, Take 1 capsule (20 mg total) by mouth daily., Disp: 30 capsule, Rfl: 1   FLUoxetine (PROZAC) 40 MG capsule, Take 1 capsule (40 mg total) by mouth daily., Disp: 90 capsule, Rfl: 1   furosemide (LASIX) 40 MG tablet, Take 1 tablet (40 mg total) by mouth daily. As needed, Disp: 30 tablet, Rfl: 6   metFORMIN  (GLUCOPHAGE-XR) 500 MG 24 hr tablet, Take 1 tablet (500 mg total) by mouth 2 (two) times daily., Disp: 60 tablet, Rfl: 2   metoprolol succinate (TOPROL-XL) 25 MG 24 hr tablet, Take 1 tablet by mouth once daily, Disp: 30 tablet, Rfl: 3   potassium chloride SA (KLOR-CON) 20 MEQ tablet, Take 1 tablet (20 mEq total) by mouth daily. (Patient taking differently: Take 20 mEq by mouth. Patient takes differently once a week as needed.), Disp: 30 tablet, Rfl: 0   rosuvastatin (CRESTOR) 20 MG tablet, Take 1 tablet by mouth once daily, Disp: 90 tablet, Rfl: 0   sacubitril-valsartan (ENTRESTO) 49-51 MG, Take 1 tablet by mouth 2 (two) times daily., Disp: 60 tablet, Rfl: 6   spironolactone (ALDACTONE) 25 MG tablet, Take 1 tablet by mouth once daily, Disp: 30 tablet, Rfl: 2   Allergies  Allergen Reactions   Sulfa Antibiotics     Past Medical History:  Diagnosis Date   ADHD (attention deficit hyperactivity disorder)    diagnosed at [facility] clinic.   Allergy    Anxiety    Social anxiety, generalized anxiety disorder.   Depression    Diabetes mellitus without complication (HCC)    HFrEF (heart failure with reduced ejection fraction) ([place])    Hypertension      Past Surgical History:  Procedure Laterality Date   RIGHT/LEFT HEART CATH AND CORONARY ANGIOGRAPHY N/A 02/16/2021  Procedure: RIGHT/LEFT HEART CATH AND CORONARY ANGIOGRAPHY;  Surgeon: Larey Dresser, MD;  Location: [facility];  Service: Cardiovascular;  Laterality: N/A;    History reviewed. No pertinent family history.  Social History   Tobacco Use   Smoking status: Former    Types: Cigarettes    Quit date: 01/12/2010    Years since quitting: 11.4   Smokeless tobacco: Never  Vaping Use   Vaping Use: Never used  Substance Use Topics   Alcohol use: No   Drug use: No    ROS   Objective:   Vitals: BP 102/68 (BP Location: Left Arm)   Pulse 77   Temp 97.8 F (36.6 C) (Oral)   Resp 16   LMP 06/02/2021 (Exact Date)   SpO2  94%   Physical Exam Constitutional:      General: She is not in acute distress.    Appearance: Normal appearance. She is well-developed. She is not ill-appearing, toxic-appearing or diaphoretic.  HENT:     Head: Normocephalic and atraumatic.     Right Ear: Tympanic membrane and ear canal normal. No drainage or tenderness. No middle ear effusion. Tympanic membrane is not erythematous.     Left Ear: Tympanic membrane and ear canal normal. No drainage or tenderness.  No middle ear effusion. Tympanic membrane is not erythematous.     Nose: Nose normal. No congestion or rhinorrhea.     Mouth/Throat:     Mouth: Mucous membranes are moist. No oral lesions.     Pharynx: No pharyngeal swelling, oropharyngeal exudate, posterior oropharyngeal erythema or uvula swelling.     Tonsils: No tonsillar exudate or tonsillar abscesses.  Eyes:     General: No scleral icterus.       Right eye: No discharge.        Left eye: No discharge.     Extraocular Movements: Extraocular movements intact.     Right eye: Normal extraocular motion.     Left eye: Normal extraocular motion.     Conjunctiva/sclera: Conjunctivae normal.     Pupils: Pupils are equal, round, and reactive to light.  Cardiovascular:     Rate and Rhythm: Normal rate and regular rhythm.     Pulses: Normal pulses.     Heart sounds: Normal heart sounds. No murmur heard.   No friction rub. No gallop.  Pulmonary:     Effort: Pulmonary effort is normal. No respiratory distress.     Breath sounds: No stridor. Rales (mild, mid-lower lung fields) present. No wheezing or rhonchi.  Musculoskeletal:     Cervical back: Normal range of motion and neck supple.  Lymphadenopathy:     Cervical: No cervical adenopathy.  Skin:    General: Skin is warm and dry.     Findings: No rash.  Neurological:     General: No focal deficit present.     Mental Status: She is alert and oriented to person, place, and time.  Psychiatric:        Mood and Affect: Mood  normal.        Behavior: Behavior normal.        Thought Content: Thought content normal.        Judgment: Judgment normal.    DG Chest 2 View  Result Date: 06/13/2021 CLINICAL DATA:  History of CHF EXAM: CHEST - 2 VIEW COMPARISON:  None. FINDINGS: Normal mediastinum and cardiac silhouette. Chronic central bronchitic markings. Mild linear markings peripherally. Normal pulmonary vasculature. No effusion, infiltrate, or pneumothorax. IMPRESSION: Mild  interstitial edema.  Chronic bronchitic markings. Electronically Signed   By: Suzy Bouchard M.D.   On: 06/13/2021 13:06     Assessment and Plan :   PDMP not reviewed this encounter.  1. Other fatigue   2. Heart failure with reduced ejection fraction ([place])   3. Anxiety   4. Type 2 diabetes mellitus treated without insulin ([place])   5. Atrial tachycardia (HCC)   6. Edema, unspecified type     Recommended increasing her Lasix dose to 80 mg for the next 3 days.  Maintain all other medications.  Patient declined COVID-19 test.  Do not suspect that the interstitial edema is related to an infectious process.  Emphasized the need for close follow-up with her cardiologist. Counseled patient on potential for adverse effects with medications prescribed/recommended today, ER and return-to-clinic precautions discussed, patient verbalized understanding.    Jaynee Eagles, PA-C 06/13/21 1326

## 2021-06-14 NOTE — Telephone Encounter (Signed)
Pt aware and voiced understanding. Reports she is already feeling better with increased dose of Lasix. Aware to contact office if symptoms return and or worsen

## 2021-06-15 ENCOUNTER — Other Ambulatory Visit: Payer: Self-pay

## 2021-06-15 ENCOUNTER — Ambulatory Visit (HOSPITAL_COMMUNITY)
Admission: RE | Admit: 2021-06-15 | Discharge: 2021-06-15 | Disposition: A | Discharge status: Home / Self Care | Payer: Medicaid Other | Source: Ambulatory Visit | Attending: Physician Assistant | Admitting: Physician Assistant

## 2021-06-15 ENCOUNTER — Encounter (HOSPITAL_COMMUNITY): Payer: Medicaid Other

## 2021-06-15 ENCOUNTER — Telehealth (HOSPITAL_COMMUNITY): Payer: Self-pay

## 2021-06-15 ENCOUNTER — Encounter (HOSPITAL_COMMUNITY): Payer: Self-pay

## 2021-06-15 VITALS — BP 130/67 | HR 82 | Wt 269.8 lb

## 2021-06-15 DIAGNOSIS — E669 Obesity, unspecified: Secondary | ICD-10-CM | POA: Insufficient documentation

## 2021-06-15 DIAGNOSIS — Z882 Allergy status to sulfonamides status: Secondary | ICD-10-CM | POA: Diagnosis not present

## 2021-06-15 DIAGNOSIS — F909 Attention-deficit hyperactivity disorder, unspecified type: Secondary | ICD-10-CM | POA: Insufficient documentation

## 2021-06-15 DIAGNOSIS — Z6841 Body Mass Index (BMI) 40.0 and over, adult: Secondary | ICD-10-CM | POA: Diagnosis not present

## 2021-06-15 DIAGNOSIS — I110 Hypertensive heart disease with heart failure: Secondary | ICD-10-CM | POA: Insufficient documentation

## 2021-06-15 DIAGNOSIS — Z87891 Personal history of nicotine dependence: Secondary | ICD-10-CM | POA: Insufficient documentation

## 2021-06-15 DIAGNOSIS — F419 Anxiety disorder, unspecified: Secondary | ICD-10-CM | POA: Insufficient documentation

## 2021-06-15 DIAGNOSIS — E119 Type 2 diabetes mellitus without complications: Secondary | ICD-10-CM | POA: Insufficient documentation

## 2021-06-15 DIAGNOSIS — Z7984 Long term (current) use of oral hypoglycemic drugs: Secondary | ICD-10-CM | POA: Diagnosis not present

## 2021-06-15 DIAGNOSIS — Z596 Low income: Secondary | ICD-10-CM | POA: Diagnosis not present

## 2021-06-15 DIAGNOSIS — K219 Gastro-esophageal reflux disease without esophagitis: Secondary | ICD-10-CM | POA: Diagnosis not present

## 2021-06-15 DIAGNOSIS — R5383 Other fatigue: Secondary | ICD-10-CM

## 2021-06-15 DIAGNOSIS — E785 Hyperlipidemia, unspecified: Secondary | ICD-10-CM | POA: Insufficient documentation

## 2021-06-15 DIAGNOSIS — I5022 Chronic systolic (congestive) heart failure: Secondary | ICD-10-CM | POA: Diagnosis not present

## 2021-06-15 DIAGNOSIS — F32A Depression, unspecified: Secondary | ICD-10-CM | POA: Insufficient documentation

## 2021-06-15 DIAGNOSIS — E039 Hypothyroidism, unspecified: Secondary | ICD-10-CM | POA: Diagnosis not present

## 2021-06-15 DIAGNOSIS — I491 Atrial premature depolarization: Secondary | ICD-10-CM | POA: Insufficient documentation

## 2021-06-15 DIAGNOSIS — Z79899 Other long term (current) drug therapy: Secondary | ICD-10-CM | POA: Insufficient documentation

## 2021-06-15 DIAGNOSIS — I471 Supraventricular tachycardia: Secondary | ICD-10-CM | POA: Diagnosis not present

## 2021-06-15 DIAGNOSIS — R0683 Snoring: Secondary | ICD-10-CM | POA: Diagnosis not present

## 2021-06-15 LAB — CBC
HCT: 52.6 % — ABNORMAL HIGH (ref 36.0–46.0)
Hemoglobin: 16.4 g/dL — ABNORMAL HIGH (ref 12.0–15.0)
MCH: 26.4 pg (ref 26.0–34.0)
MCHC: 31.2 g/dL (ref 30.0–36.0)
MCV: 84.6 fL (ref 80.0–100.0)
Platelets: 212 K/uL (ref 150–400)
RBC: 6.22 MIL/uL — ABNORMAL HIGH (ref 3.87–5.11)
RDW: 18.2 % — ABNORMAL HIGH (ref 11.5–15.5)
WBC: 10.4 K/uL (ref 4.0–10.5)
nRBC: 0 % (ref 0.0–0.2)

## 2021-06-15 LAB — COMPREHENSIVE METABOLIC PANEL
ALT: 29 U/L (ref 0–44)
AST: 24 U/L (ref 15–41)
Albumin: 3.7 g/dL (ref 3.5–5.0)
Alkaline Phosphatase: 75 U/L (ref 38–126)
Anion gap: 13 (ref 5–15)
BUN: 17 mg/dL (ref 6–20)
CO2: 20 mmol/L — ABNORMAL LOW (ref 22–32)
Calcium: 9.2 mg/dL (ref 8.9–10.3)
Chloride: 101 mmol/L (ref 98–111)
Creatinine, Ser: 1.07 mg/dL — ABNORMAL HIGH (ref 0.44–1.00)
GFR, Estimated: >60 mL/min (ref >60)
Glucose, Bld: 199 mg/dL — ABNORMAL HIGH (ref 70–99)
Potassium: 4 mmol/L (ref 3.5–5.1)
Sodium: 134 mmol/L — ABNORMAL LOW (ref 135–145)
Total Bilirubin: 0.9 mg/dL (ref 0.3–1.2)
Total Protein: 7 g/dL (ref 6.5–8.1)

## 2021-06-15 LAB — TSH: TSH: 5.307 u[IU]/mL — ABNORMAL HIGH (ref 0.350–4.500)

## 2021-06-15 LAB — T4, FREE: Free T4: 0.92 ng/dL (ref 0.61–1.12)

## 2021-06-15 MED ORDER — FUROSEMIDE 40 MG PO TABS: 40.0000 mg | ORAL_TABLET | Freq: Every day | ORAL | 6 refills | Status: DC

## 2021-06-15 MED ORDER — POTASSIUM CHLORIDE CRYS ER 20 MEQ PO TBCR: 20.0000 meq | EXTENDED_RELEASE_TABLET | Freq: Every day | ORAL | 3 refills | Status: DC

## 2021-06-15 NOTE — Patient Instructions (Addendum)
EKG was performed today  Labs were done today, if any labs are abnormal the clinic will call you   You will be setup for Sleep Study, the sleep study clinic will call you to set up appointment  At the [facility], you and your health needs are our priority. As part of our continuing mission to provide you with exceptional heart care, we have created designated Provider Care Teams. These Care Teams include your primary Cardiologist (physician) and Advanced Practice Providers (APPs- Physician Assistants and Nurse Practitioners) who all work together to provide you with the care you need, when you need it.   You may see any of the following providers on your designated Care Team at your next follow up: Dr Glori Bickers Dr Loralie Champagne Dr Patrice Paradise, NP Lyda Jester, [place] Ginnie Smart Audry Riles, PharmD   Please be sure to bring in all your medications bottles to every appointment.    If you have any questions or concerns before your next appointment please send [place] a message through [place] or call our office at [phone].    TO LEAVE A MESSAGE FOR THE NURSE SELECT OPTION 2, PLEASE LEAVE A MESSAGE INCLUDING: YOUR NAME DATE OF BIRTH CALL BACK NUMBER REASON FOR CALL**this is important as we prioritize the call backs  YOU WILL RECEIVE A CALL BACK THE SAME DAY AS LONG AS YOU CALL BEFORE 4:00 PM

## 2021-06-15 NOTE — Progress Notes (Signed)
ADVANCED HF CLINIC NOTE  Primary Care: Dr. Alcus Dad Primary HF Cardiologist: Dr. Aundra Dubin  HPI: 45 y/o female w/ h/o systolic HF/nonischemic CM, obesity, ADHD, anxiety, depression, hypothyroidism, GERD, PACs, new diagnosis of DM2.  Presented to ED on 5/30 w/ complaints of 3 week h/o progressive DOE, LEE and abdominal fullness. Found to be in acute CHF. BNP 1388. CXR w/ mild congestive CHF w/ interstitial edema. EKG showed ST 120 bpm. HS trop 48>>50.  TSH 13.416. Free T4 WNL. Chest CT negative for PE. She was admitted for CHF and started on IV Lasix. Echo showed biventricular dysfunction. LVEF severely reduced 20-25% w/ global HK. Mild LVH. RV moderately reduced w/ estimated RVSP 36 mmHg. Also found to have new diagnosis of T2D, Hgb A1c 11. She responded well to diuresis and was transitioned to oral Lasix and discharged on GDMT.  Seen for HF f/u 04/26/21. Weight 275 lb.  Volume appeared stable.   Seen in ED on 09/27 with fatigue. Fluoxetine and metformin recently increased at PCP visit 09/16. Chest x-ray with evidence of mild interstitial edema. It was recommended to increase lasix to 80 mg daily for 3 days. Declined COVID test as had negative result at home. No labs or ECG done.    She called this morning due to persistent symptoms and was scheduled for urgent f/u.  Denies any dyspnea, orthopnea, PND or LE edema. Reports losing a few lb after first 80 mg dose of lasix, but weight up a few lb at home today. Has not taken furosemide this am. Clinic weight down 6 lb from last visit. Main complaint is significant fatigue for about a week. Some dizziness with positional changes but no presyncope or syncope. Adherent with medical therapy. 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   Cardiac Studies: - Echo (02/14/21): EF 20-25%, severe LV dysfunction, RV moderately reduced  - cMRI (5/22): LVEF 21%, diffuse hypokinesis, normal RV size with EF 30%, No definite myocardial LGE (though  difficult images).   - L/RHC (6/22): No significant CAD, PCWP and LVEDP remain elevated, pulmonary venous hypertension, preserved CO, , nonischemic CM.  RHC Procedural Findings: Hemodynamics (mmHg) RA mean 6 RV 47/11 PA 53/20, mean 30 PCWP mean 20 LV 105/26 AO 104/79  Oxygen saturations: PA 66% AO 91%  Cardiac Output (Fick) 5.93  Cardiac Index (Fick) 2.74  PVR 1.7 WU  CO (Thermodilution) 6.93  CI (Thermodilution) 3.2 PVR 1.4 WU   ROS: All systems reviewed and negative except as per HPI.   Past Medical History:  Diagnosis Date   ADHD (attention deficit hyperactivity disorder)    diagnosed at [facility] clinic.   Allergy    Anxiety    Social anxiety, generalized anxiety disorder.   Depression    Diabetes mellitus without complication (HCC)    HFrEF (heart failure with reduced ejection fraction) (HCC)    Hypertension    Current Outpatient Medications  Medication Sig Dispense Refill   acetaminophen (TYLENOL) 325 MG tablet Take 2 tablets (650 mg total) by mouth every 6 (six) hours as needed for mild pain.     amiodarone (PACERONE) 200 MG tablet Take 1 tablet (200 mg total) by mouth daily. 30 tablet 3   FLUoxetine (PROZAC) 40 MG capsule Take 1 capsule (40 mg total) by mouth daily. 90 capsule 1   JARDIANCE 10 MG TABS tablet Take 1 tablet by mouth once daily 30 tablet 2   metFORMIN (GLUCOPHAGE-XR) 500 MG 24 hr tablet Take 1 tablet (500 mg  total) by mouth 2 (two) times daily. 60 tablet 2   metoprolol succinate (TOPROL-XL) 25 MG 24 hr tablet Take 1 tablet by mouth once daily 30 tablet 3   rosuvastatin (CRESTOR) 20 MG tablet Take 1 tablet by mouth once daily 90 tablet 0   sacubitril-valsartan (ENTRESTO) 49-51 MG Take 1 tablet by mouth 2 (two) times daily. 60 tablet 6   spironolactone (ALDACTONE) 25 MG tablet Take 1 tablet by mouth once daily 30 tablet 2   furosemide (LASIX) 40 MG tablet Take 1 tablet (40 mg total) by mouth daily. As needed for weight gain of 3 lbs in 24 hour or 5 lbs  in a week 30 tablet 6   potassium chloride SA (KLOR-CON) 20 MEQ tablet Take 1 tablet (20 mEq total) by mouth daily. As needed when lasix dose is taken 30 tablet 3   No current facility-administered medications for this encounter.   Allergies  Allergen Reactions   Sulfa Antibiotics    Social History   Socioeconomic History   Marital status: Married    Spouse name: Not on file   Number of children: Not on file   Years of education: Not on file   Highest education level: Not on file  Occupational History   Not on file  Tobacco Use   Smoking status: Former    Types: Cigarettes    Quit date: 01/12/2010    Years since quitting: 11.4   Smokeless tobacco: Never  Vaping Use   Vaping Use: Never used  Substance and Sexual Activity   Alcohol use: No   Drug use: No   Sexual activity: Not on file  Other Topics Concern   Not on file  Social History Narrative   Marital status: divorced and engaged since 2011.     Children: 2 children      Lives with fiance and two children (40, 10)      Employment: childcare early development; 3s and 4s. Surveyor, quantity      Tobacco: none      Alcohol: none      Drugs; None      Exercise: sporadic.           Social Determinants of Health   Financial Resource Strain: Medium Risk   Difficulty of Paying Living Expenses: Somewhat hard  Food Insecurity: No Food Insecurity   Worried About Charity fundraiser in the Last Year: Never true   Ran Out of Food in the Last Year: Never true  Transportation Needs: No Transportation Needs   Lack of Transportation (Medical): No   Lack of Transportation (Non-Medical): No  Physical Activity: Not on file  Stress: Not on file  Social Connections: Not on file  Intimate Partner Violence: Not on file   No family history of CHF or CAD.   BP 130/67   Pulse 82   Wt 122.4 kg (269 lb 12.8 oz)   LMP 06/02/2021 (Exact Date)   SpO2 94%   BMI 52.69 kg/m   Wt Readings from Last 3 Encounters:  06/15/21 122.4 kg  (269 lb 12.8 oz)  06/02/21 123.8 kg (273 lb)  04/26/21 125.6 kg (277 lb)   ReDS 30% PHYSICAL EXAM: General:  Well appearing. No resp difficulty HEENT: normal Neck: supple. no JVD. Carotids 2+ bilat; no bruits. No lymphadenopathy or thryomegaly appreciated. Cor: PMI nondisplaced. Regular rate & rhythm. No rubs, gallops or murmurs. Lungs: clear Abdomen: obese, nontender, nondistended. No hepatosplenomegaly. No bruits or masses. Good bowel sounds. Extremities: no  cyanosis, clubbing, rash, edema Neuro: alert & orientedx3, cranial nerves grossly intact. moves all 4 extremities w/o difficulty. Affect pleasant   ASSESSMENT & PLAN:  1. Chronic systolic CHF:  New onset cardiomyopathy June of uncertain etiology. Echo with EF 20-25%, moderately decreased RV systolic function.  LHC/RHC with preserved cardiac output, no significant coronary disease (nonischemic cardiomyopathy). Cardiac MRI with LV EF 21%, RV EF 30%, no myocardial LGE.  No ETOH, no drugs, no FH of CAD or CMP. Consider prior viral myocarditis, effects of diabetes itself, or newly-diagnosed hypothyroidism.  However, patient also has had frequent episodes of rapid atrial tachycardia in the hospital and says that her HR has often been up to 150 bpm at home on her Apple Watch. Concern this may be tachycardia-mediated CMP.   - NYHA II, early III, d/t significant fatigue. Although, not sure how much her fatigue is d/t HF. On lasix 40 mg prn, recently increased to 80 daily X 3 days at ED 09/27 (only took 2 doses).  - ReDS 30%. Does not appear volume up. Decrease furosemide back to 40 mg prn - Continue Entresto 49/51 mg bid.  Did not increase today d/t orthostatic dizziness. - Continue sprionolactone 25 mg daily.  - Continue Toprol XL 25 mg daily. - Continue Jardiance 10 mg daily. No GU symptoms. - CMET today - Needs to maintain NSR. - Repeat echo scheduled same day as f/u next month 2. Type 2 diabetes: New diagnosis, per PCP. A1c 11. On  Jardiance. Watch for GU symptoms.  Further management per PCP. 3. Hypothyroidism: New diagnosis, per PCP. TSH 8.9 (7/22). Free T4 normal. Not currently on medication. - Recheck TSH and Free T4 d/t fatigue 4. Hyperlipidemia: On statin. TG 272, LDL 77.  - Would like to add Vascepa. Did not discuss today d/t other acute issues. 5. Atrial tachycardia: Frequent episodes, recurred when amiodarone stopped.  On questioning, her HR has frequently been elevated as high as 150s at home though she does not feel palpitations.  I am concerned that she could have a tachy-mediated CMP from AT.  Now back in NSR on amiodarone. HR on watch has been stable. - Continue amiodarone 200 mg daily. Hopefully do not need to use long-term.  She will need regular eye exams and PFTs if remains on for longer duration - EP has seen, thinks her high BMI makes ablation less likely to be successful. 6. Obesity: She is working on weight loss through dietary changes and increased activity. - Body mass index is 52.69 kg/m. 7. Snores: Sleep study ordered. 8. Fatigue: Pretty significant this past week.  Maintaining sinus. Check thyroid studies as above. CMP and CBC. Sleep study to rule out OSA. BP okay.     Follow up as scheduled with Dr. Aundra Dubin 06/26/21 with echo   Marlyce Huge, PA-C 06/15/21

## 2021-06-15 NOTE — Telephone Encounter (Signed)
Patient called and stated that she had a weight gain of 3 pounds overnight. Patient was seen in the urgent care was advised to "Recommended increasing her Lasix dose to 80 mg for the next 3 days." Patient stated that she complied with those directions. Patient was added to NP/PA schedule for today.

## 2021-06-15 NOTE — Progress Notes (Signed)
ReDS Vest / Clip - 06/15/21 1000       ReDS Vest / Clip   Station Marker B    Ruler Value 40    ReDS Value Range Low volume    ReDS Actual Value 30

## 2021-06-26 ENCOUNTER — Ambulatory Visit (HOSPITAL_BASED_OUTPATIENT_CLINIC_OR_DEPARTMENT_OTHER)
Admission: RE | Admit: 2021-06-26 | Discharge: 2021-06-26 | Disposition: A | Discharge status: Home / Self Care | Payer: Medicaid Other | Source: Ambulatory Visit | Attending: Cardiology | Admitting: Cardiology

## 2021-06-26 ENCOUNTER — Other Ambulatory Visit: Payer: Self-pay

## 2021-06-26 ENCOUNTER — Ambulatory Visit (HOSPITAL_COMMUNITY)
Admission: RE | Admit: 2021-06-26 | Discharge: 2021-06-26 | Disposition: A | Discharge status: Home / Self Care | Payer: Medicaid Other | Source: Ambulatory Visit | Attending: Cardiology | Admitting: Cardiology

## 2021-06-26 ENCOUNTER — Encounter (HOSPITAL_COMMUNITY): Payer: Self-pay | Admitting: Cardiology

## 2021-06-26 VITALS — BP 124/82 | HR 60 | Wt 270.8 lb

## 2021-06-26 DIAGNOSIS — Z882 Allergy status to sulfonamides status: Secondary | ICD-10-CM | POA: Insufficient documentation

## 2021-06-26 DIAGNOSIS — E785 Hyperlipidemia, unspecified: Secondary | ICD-10-CM | POA: Insufficient documentation

## 2021-06-26 DIAGNOSIS — I110 Hypertensive heart disease with heart failure: Secondary | ICD-10-CM | POA: Diagnosis not present

## 2021-06-26 DIAGNOSIS — F419 Anxiety disorder, unspecified: Secondary | ICD-10-CM | POA: Diagnosis not present

## 2021-06-26 DIAGNOSIS — I5020 Unspecified systolic (congestive) heart failure: Secondary | ICD-10-CM

## 2021-06-26 DIAGNOSIS — I5022 Chronic systolic (congestive) heart failure: Secondary | ICD-10-CM | POA: Insufficient documentation

## 2021-06-26 DIAGNOSIS — E119 Type 2 diabetes mellitus without complications: Secondary | ICD-10-CM | POA: Diagnosis not present

## 2021-06-26 DIAGNOSIS — R0683 Snoring: Secondary | ICD-10-CM | POA: Diagnosis not present

## 2021-06-26 DIAGNOSIS — F32A Depression, unspecified: Secondary | ICD-10-CM | POA: Insufficient documentation

## 2021-06-26 DIAGNOSIS — E039 Hypothyroidism, unspecified: Secondary | ICD-10-CM | POA: Insufficient documentation

## 2021-06-26 DIAGNOSIS — Z7984 Long term (current) use of oral hypoglycemic drugs: Secondary | ICD-10-CM | POA: Insufficient documentation

## 2021-06-26 DIAGNOSIS — Z79899 Other long term (current) drug therapy: Secondary | ICD-10-CM | POA: Diagnosis not present

## 2021-06-26 DIAGNOSIS — I428 Other cardiomyopathies: Secondary | ICD-10-CM | POA: Diagnosis not present

## 2021-06-26 DIAGNOSIS — Z6841 Body Mass Index (BMI) 40.0 and over, adult: Secondary | ICD-10-CM | POA: Diagnosis not present

## 2021-06-26 DIAGNOSIS — Z87891 Personal history of nicotine dependence: Secondary | ICD-10-CM | POA: Insufficient documentation

## 2021-06-26 DIAGNOSIS — I471 Supraventricular tachycardia: Secondary | ICD-10-CM | POA: Diagnosis not present

## 2021-06-26 DIAGNOSIS — K219 Gastro-esophageal reflux disease without esophagitis: Secondary | ICD-10-CM | POA: Diagnosis not present

## 2021-06-26 DIAGNOSIS — E669 Obesity, unspecified: Secondary | ICD-10-CM | POA: Insufficient documentation

## 2021-06-26 LAB — BASIC METABOLIC PANEL
Anion gap: 9 (ref 5–15)
BUN: 17 mg/dL (ref 6–20)
CO2: 22 mmol/L (ref 22–32)
Calcium: 9.3 mg/dL (ref 8.9–10.3)
Chloride: 103 mmol/L (ref 98–111)
Creatinine, Ser: 1 mg/dL (ref 0.44–1.00)
GFR, Estimated: >60 mL/min (ref >60)
Glucose, Bld: 106 mg/dL — ABNORMAL HIGH (ref 70–99)
Potassium: 4.2 mmol/L (ref 3.5–5.1)
Sodium: 134 mmol/L — ABNORMAL LOW (ref 135–145)

## 2021-06-26 LAB — ECHOCARDIOGRAM COMPLETE
Area-P 1/2: 3.27 cm2
Calc EF: 46.8 %
S' Lateral: 3.7 cm
Single Plane A2C EF: 51.2 %
Single Plane A4C EF: 42.1 %

## 2021-06-26 LAB — BRAIN NATRIURETIC PEPTIDE: B Natriuretic Peptide: 142.3 pg/mL — ABNORMAL HIGH (ref 0.0–100.0)

## 2021-06-26 MED ORDER — AMIODARONE HCL 200 MG PO TABS
100.0000 mg | ORAL_TABLET | Freq: Every day | ORAL | 3 refills | Status: DC
Start: 2021-06-26 — End: 2021-08-29

## 2021-06-26 MED ORDER — AMIODARONE HCL 200 MG PO TABS: 200.0000 mg | ORAL_TABLET | Freq: Every day | ORAL | 3 refills | Status: DC

## 2021-06-26 MED ORDER — METOPROLOL SUCCINATE ER 50 MG PO TB24: 50.0000 mg | ORAL_TABLET | Freq: Every day | ORAL | 3 refills | Status: DC

## 2021-06-26 NOTE — Progress Notes (Signed)
  Echocardiogram 2D Echocardiogram has been performed.  Vickie Graham 06/26/2021, 11:53 AM

## 2021-06-26 NOTE — Patient Instructions (Addendum)
Labs done today. We will contact you only if your labs are abnormal.  DECREASE Amiodarone to 100mg  (1/2 tablet) by mouth daily.   INCREASE Metoprolol to 50mg  (1 tablet) by mouth daily.   No other medication changes were made. Please continue all current medications as prescribed.  You have been referred to the Pharmacy Clinic. They will contact you to schedule an appointment.  Your physician recommends that you schedule a follow-up appointment in: 3 weeks with our Clinic Pharmacist and in 2 months with our APP Clinic here in our office.   If you have any questions or concerns before your next appointment please send [place] a message through [place] or call our office at [phone].    TO LEAVE A MESSAGE FOR THE NURSE SELECT OPTION 2, PLEASE LEAVE A MESSAGE INCLUDING: YOUR NAME DATE OF BIRTH CALL BACK NUMBER REASON FOR CALL**this is important as we prioritize the call backs  YOU WILL RECEIVE A CALL BACK THE SAME DAY AS LONG AS YOU CALL BEFORE 4:00 PM   Do the following things EVERYDAY: Weigh yourself in the morning before breakfast. Write it down and keep it in a log. Take your medicines as prescribed Eat low salt foods--Limit salt (sodium) to 2000 mg per day.  Stay as active as you can everyday Limit all fluids for the day to less than 2 liters   At the [facility], you and your health needs are our priority. As part of our continuing mission to provide you with exceptional heart care, we have created designated Provider Care Teams. These Care Teams include your primary Cardiologist (physician) and Advanced Practice Providers (APPs- Physician Assistants and Nurse Practitioners) who all work together to provide you with the care you need, when you need it.   You may see any of the following providers on your designated Care Team at your next follow up: Dr Glori Bickers Dr Haynes Kerns, NP Lyda Jester, [place] Audry Riles, PharmD   Please be sure  to bring in all your medications bottles to every appointment.

## 2021-06-27 NOTE — Progress Notes (Signed)
ADVANCED HF CLINIC NOTE  Primary Care: Dr. Alcus Dad Primary HF Cardiologist: Dr. Aundra Dubin  HPI: 45 y.o. female w/ h/o systolic HF/nonischemic CM, obesity, ADHD, anxiety, depression, hypothyroidism, GERD, PACs, DM2.  Presented to ED on 02/13/21 w/ complaints of 3 week h/o progressive DOE, LEE and abdominal fullness. Found to be in acute CHF. BNP 1388. CXR w/ mild congestive CHF w/ interstitial edema. EKG showed ST 120 bpm. HS trop 48>>50.  TSH 13.416. Free T4 WNL. Chest CT negative for PE. She was admitted for CHF and started on IV Lasix. Echo showed biventricular dysfunction. LVEF severely reduced 20-25% w/ global HK. Mild LVH. RV moderately reduced w/ estimated RVSP 36 mmHg. Also found to have new diagnosis of T2D, Hgb A1c 11. She responded well to diuresis and was transitioned to oral Lasix and discharged on GDMT.  Seen in ED on 06/13/21 with fatigue. Fluoxetine and metformin recently increased at PCP visit 09/16. Chest x-ray with evidence of mild interstitial edema. It was recommended to increase lasix to 80 mg daily for 3 days. Declined COVID test as had negative result at home. No labs or ECG done.    Echo was done today and reviewed, EF 40-45%, mild LV enlargement, normal RV, normal IVC.   She returns today for followup of CHF.  She has been taking Lasix once or twice a week.  Her weight is stable.  She is short of breath only with heavy exertion.  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  No problems walking on flat ground.  Rare palpitations.  No chest pain.  She is in NSR today by exam.   Labs (9/22): TSH mildly elevated, K 4, creatinine 1.07, LFTs normal  PMH: 1. Type 2 diabetes 2. ADHD 3. HTN 4. Depression 5. GERD 6. Obesity 7. Chronic systolic CHF:  Nonischemic cardiomyopathy.  - Echo (5/22) with EF 20-25%, global HK, moderately decreased RV systolic function.  - RHC/LHC: No significant CAD; mean RA 6, PA 53/20 mean 30, mean PCWP 20, CI (Fick) 2.74, PVR 1.7 WU.   - Cardiac MRI (6/22): Mildly dilated LV with EF 21%, RV EF 30%, no definite LGE (difficult images).  - Echo (10/22) with EF 40-45%, mild LV enlargement, normal RV.  8. Hypothyroidism 9. Atrial tachycardia 10. Hyperlipidemia.   ROS: All systems reviewed and negative except as per HPI.   FH: No family history of cardiomyopathy.    Current Outpatient Medications  Medication Sig Dispense Refill   acetaminophen (TYLENOL) 325 MG tablet Take 2 tablets (650 mg total) by mouth every 6 (six) hours as needed for mild pain.     FLUoxetine (PROZAC) 40 MG capsule Take 1 capsule (40 mg total) by mouth daily. 90 capsule 1   furosemide (LASIX) 40 MG tablet Take 1 tablet (40 mg total) by mouth daily. As needed for weight gain of 3 lbs in 24 hour or 5 lbs in a week 30 tablet 6   JARDIANCE 10 MG TABS tablet Take 1 tablet by mouth once daily 30 tablet 2   metFORMIN (GLUCOPHAGE-XR) 500 MG 24 hr tablet Take 1 tablet (500 mg total) by mouth 2 (two) times daily. 60 tablet 2   potassium chloride SA (KLOR-CON) 20 MEQ tablet Take 1 tablet (20 mEq total) by mouth daily. As needed when lasix dose is taken 30 tablet 3   rosuvastatin (CRESTOR) 20 MG tablet Take 1 tablet by mouth once daily 90 tablet 0   sacubitril-valsartan (ENTRESTO) 49-51 MG Take 1  tablet by mouth 2 (two) times daily. 60 tablet 6   spironolactone (ALDACTONE) 25 MG tablet Take 1 tablet by mouth once daily 30 tablet 2   amiodarone (PACERONE) 200 MG tablet Take 0.5 tablets (100 mg total) by mouth daily. 45 tablet 3   metoprolol succinate (TOPROL-XL) 50 MG 24 hr tablet Take 1 tablet (50 mg total) by mouth daily. 90 tablet 3   No current facility-administered medications for this encounter.   Allergies  Allergen Reactions   Sulfa Antibiotics    Social History   Socioeconomic History   Marital status: Married    Spouse name: Not on file   Number of children: Not on file   Years of education: Not on file   Highest education level: Not on file   Occupational History   Not on file  Tobacco Use   Smoking status: Former    Types: Cigarettes    Quit date: 01/12/2010    Years since quitting: 11.4   Smokeless tobacco: Never  Vaping Use   Vaping Use: Never used  Substance and Sexual Activity   Alcohol use: No   Drug use: No   Sexual activity: Not on file  Other Topics Concern   Not on file  Social History Narrative   Marital status: divorced and engaged since 2011.     Children: 2 children      Lives with fiance and two children (50, 10)      Employment: childcare early development; 3s and 4s. Surveyor, quantity      Tobacco: none      Alcohol: none      Drugs; None      Exercise: sporadic.           Social Determinants of Health   Financial Resource Strain: Medium Risk   Difficulty of Paying Living Expenses: Somewhat hard  Food Insecurity: No Food Insecurity   Worried About Charity fundraiser in the Last Year: Never true   Ran Out of Food in the Last Year: Never true  Transportation Needs: No Transportation Needs   Lack of Transportation (Medical): No   Lack of Transportation (Non-Medical): No  Physical Activity: Not on file  Stress: Not on file  Social Connections: Not on file  Intimate Partner Violence: Not on file    BP 124/82   Pulse 60   Wt 122.8 kg (270 lb 12.8 oz)   LMP 06/02/2021 (Exact Date)   SpO2 95%   BMI 52.89 kg/m   Wt Readings from Last 3 Encounters:  06/26/21 122.8 kg (270 lb 12.8 oz)  06/15/21 122.4 kg (269 lb 12.8 oz)  06/02/21 123.8 kg (273 lb)   PHYSICAL EXAM: General: NAD, obese.  Neck: No JVD, no thyromegaly or thyroid nodule.  Lungs: Clear to auscultation bilaterally with normal respiratory effort. CV: Nondisplaced PMI.  Heart regular S1/S2, no S3/S4, no murmur.  No peripheral edema.  No carotid bruit.  Normal pedal pulses.  Abdomen: Soft, nontender, no hepatosplenomegaly, no distention.  Skin: Intact without lesions or rashes.  Neurologic: Alert and oriented x 3.  Psych:  Normal affect. Extremities: No clubbing or cyanosis.  HEENT: Normal.   ASSESSMENT & PLAN:  1. Chronic systolic CHF:  New onset cardiomyopathy 5/22, nonischemic cardiomyopathy. Echo in 5/22 with EF 20-25%, moderately decreased RV systolic function.  LHC/RHC in 6/22 with preserved cardiac output, no significant coronary disease (nonischemic cardiomyopathy). Cardiac MRI with LV EF 21%, RV EF 30%, no myocardial LGE.  No ETOH, no drugs,  no FH of CAD or CMP. Consider prior viral myocarditis, effects of diabetes itself, or newly-diagnosed hypothyroidism.  However, patient also has had frequent episodes of rapid atrial tachycardia in the hospital and said that her HR has often been up to 150 bpm at home on her Apple Watch. Concern this may be tachycardia-mediated CMP.  Echo today showed EF up to 40-45%, normal RV.  NYHA class II symptoms, not volume overloaded.  - Continue Lasix 40 mg prn.  - Continue Entresto 49/51 mg bid.   - Continue sprionolactone 25 mg daily.  - Increase Toprol XL to 50 mg daily.  - Continue Jardiance 10 mg daily. - BMET today.  - EF is out of ICD range on today's echo.  2. Type 2 diabetes: Per PCP.  3. Hypothyroidism: Subclinical, mild TSH elevation.  - Follow closely on amiodarone.  4. Hyperlipidemia: Continue statin.  5. Atrial tachycardia: Frequent episodes noted during 5/22-6/22 admission, recurred when amiodarone stopped.  On questioning, her HR has frequently been elevated as high as 150s at home though she does not feel palpitations.  I am concerned that she could have a tachy-mediated CMP from AT, and EF is now improving.  She has stayed in NSR on amiodarone.  - Decrease amiodarone to 100 mg daily.  Recent LFTs normal, TSH mildly elevated.  She will need a regular eye exam.  - EP has seen, thinks her high BMI makes ablation less likely to be successful.  Would consider in future if she can lose weight.  6. Obesity: She is working on weight loss through dietary changes and  increased activity. Body mass index is 52.89 kg/m. - I will refer to pharmacy clinic for semaglutide.  7. Snores: She has been referred for in-hospital sleep study (insurance will not cover home SS).   Followup 3 wks with HF pharmacist (increase Entresto), see APP in 2 months.   Loralie Champagne 06/27/2021

## 2021-06-29 ENCOUNTER — Ambulatory Visit (INDEPENDENT_AMBULATORY_CARE_PROVIDER_SITE_OTHER): Payer: Medicaid Other | Admitting: Pharmacist

## 2021-06-29 ENCOUNTER — Other Ambulatory Visit: Payer: Self-pay

## 2021-06-29 VITALS — BP 110/68 | HR 60 | Wt 274.0 lb

## 2021-06-29 DIAGNOSIS — I5020 Unspecified systolic (congestive) heart failure: Secondary | ICD-10-CM | POA: Diagnosis not present

## 2021-06-29 DIAGNOSIS — E785 Hyperlipidemia, unspecified: Secondary | ICD-10-CM | POA: Diagnosis not present

## 2021-06-29 DIAGNOSIS — E1169 Type 2 diabetes mellitus with other specified complication: Secondary | ICD-10-CM | POA: Diagnosis not present

## 2021-06-29 NOTE — Progress Notes (Signed)
.Patient ID: Vickie Graham                 DOB: 1975-12-29                    MRN: [id number]     HPI: Vickie Graham is a 45 y.o. female patient referred to pharmacy clinic by Dr Aundra Dubin to initiate weight loss therapy with GLP1-RA. PMH is significant for obesity complicated by chronic medical conditions including CHF, T2DM, obesity and hypothyroidism..   Patient is a recently diagnosed diabetic and has reduced her A1c from 11 to 7.1 in 4 months through diet changes.  On metformin for DM and Jardiance for DM/ HF.  Reports her health turned for the worse around 2017. Before hand she had been walking her dog daily and eating using the Paleo diet.  However once pandemic started her job became more stressful and she became less active and started eating more delivery food.  PCP tried to prescribe patient Ozempic and Trulicity but both were denied by [facility] Medicaid.  Current meds that may affect weight:  Jardiance Furosemide  Baseline weight/BMI: 53.51kg/m2  Insurance payor:  [place] Medicaid  Diet:   Favorite meal is breakfast.    If she has time: eggs hash browns, peppers zucchini, avocado  If she does not have time: [employer] sausage biscuit  -Lunch: leftovers from dinner -Dinner: chili, vegetables, chicken -Snacks: dark chocoate -Drinks: water.  One regular root beer a week  Exercise: PCP challenged her to reach 7000 steps a day.  She reports she has not met this goal  Labs: Lab Results  Component Value Date   HGBA1C 7.1 (A) 06/02/2021    Wt Readings from Last 1 Encounters:  06/26/21 270 lb 12.8 oz (122.8 kg)    BP Readings from Last 1 Encounters:  06/26/21 124/82   Pulse Readings from Last 1 Encounters:  06/26/21 60       Component Value Date/Time   CHOL 152 04/17/2021 0844   TRIG 272 (H) 04/17/2021 0844   HDL 30 (L) 04/17/2021 0844   CHOLHDL 5.1 (H) 04/17/2021 0844   CHOLHDL 5.7 03/29/2021 1315   VLDL UNABLE TO CALCULATE IF TRIGLYCERIDE OVER 400 mg/dL 03/29/2021  1315   LDLCALC 77 04/17/2021 0844   LDLDIRECT 68.0 03/29/2021 1315    Past Medical History:  Diagnosis Date   ADHD (attention deficit hyperactivity disorder)    diagnosed at [facility] clinic.   Allergy    Anxiety    Social anxiety, generalized anxiety disorder.   Depression    Diabetes mellitus without complication ([place])    HFrEF (heart failure with reduced ejection fraction) ([place])    Hypertension     Current Outpatient Medications on File Prior to Visit  Medication Sig Dispense Refill   acetaminophen (TYLENOL) 325 MG tablet Take 2 tablets (650 mg total) by mouth every 6 (six) hours as needed for mild pain.     amiodarone (PACERONE) 200 MG tablet Take 0.5 tablets (100 mg total) by mouth daily. 45 tablet 3   FLUoxetine (PROZAC) 40 MG capsule Take 1 capsule (40 mg total) by mouth daily. 90 capsule 1   furosemide (LASIX) 40 MG tablet Take 1 tablet (40 mg total) by mouth daily. As needed for weight gain of 3 lbs in 24 hour or 5 lbs in a week 30 tablet 6   JARDIANCE 10 MG TABS tablet Take 1 tablet by mouth once daily 30 tablet 2   metFORMIN (GLUCOPHAGE-XR) 500  MG 24 hr tablet Take 1 tablet (500 mg total) by mouth 2 (two) times daily. 60 tablet 2   metoprolol succinate (TOPROL-XL) 50 MG 24 hr tablet Take 1 tablet (50 mg total) by mouth daily. 90 tablet 3   potassium chloride SA (KLOR-CON) 20 MEQ tablet Take 1 tablet (20 mEq total) by mouth daily. As needed when lasix dose is taken 30 tablet 3   rosuvastatin (CRESTOR) 20 MG tablet Take 1 tablet by mouth once daily 90 tablet 0   sacubitril-valsartan (ENTRESTO) 49-51 MG Take 1 tablet by mouth 2 (two) times daily. 60 tablet 6   spironolactone (ALDACTONE) 25 MG tablet Take 1 tablet by mouth once daily 30 tablet 2   No current facility-administered medications on file prior to visit.    Allergies  Allergen Reactions   Sulfa Antibiotics      Assessment/Plan:  1. Weight loss -   Patient would benefit from GLP1 treatment due to DM, CHF, and  obesity.  Unfortunately was denied by insurance previously but will attempt again.  Using [employer], educated patient on dosing and titration.    Advised patient on common side effects including nausea, diarrhea, dyspepsia, decreased appetite, and fatigue. Counseled patient on reducing meal size and how to titrate medication to minimize side effects. Counseled patient to call if intolerable side effects or if experiencing dehydration, abdominal pain, or dizziness. Patient will adhere to dietary modifications and will target at least 150 minutes of moderate intensity exercise weekly.   Gave patient planning healthy meals handout and discussed importance of eating vegetables, lean proteins, healthy fats and minimizing simple carbohydrates.  Will be weighed at HF clinic on 11/2.  Karren Cobble, PharmD, BCACP, [place], [place] [address], [place], [place] [address] Phone: [phone]; Fax: [phone] 06/29/2021 5:16 PM

## 2021-06-29 NOTE — Patient Instructions (Addendum)
It was nice meeting you today  I will complete the prior authorization for you for Ozempic  If approved, you will inject 0.25mg  once a week for 4 weeks and then increase to 0.5mg  once a week  Continue your diet changes.  Concentrate on vegetables, lean proteins, healthy fats, and whole grains.   Try to increase your exercise gradually to a goal of at least 30 minutes at least 5 days a week  Please call with any questions!  Karren Cobble, PharmD, BCACP, [place], [place] [address], [place], [place] [address] Phone: [phone]; Fax: [phone] 06/29/2021 3:07 PM

## 2021-06-29 NOTE — Progress Notes (Deleted)
Patient ID: [name]                 DOB: 07-14-1976                      MRN: [id number]     HPI: Vickie Graham is a 45 y.o. female referred by Dr. Marland Kitchen to pharmacy clinic for HF medication management. PMH is significant for ***. Most recent LVEF *** on ***.  Today she returns to pharmacy clinic for further medication titration. At last visit with MD ***. Symptomatically, she is feeling ***, *** dizziness, lightheadedness, and fatigue. *** chest pain or palpitations. Feels SOB when ***. Able to complete all ADLs. Activity level ***. She *** checks her weight at home (normal range *** - *** lbs). *** LEE, PND, or orthopnea. Appetite has been ***. She *** adheres to a low-salt diet.  Current CHF meds:  Previously tried:  BP goal:   Family History:   Social History:   Diet:   Exercise:   Home BP readings:   Wt Readings from Last 3 Encounters:  06/26/21 270 lb 12.8 oz (122.8 kg)  06/15/21 269 lb 12.8 oz (122.4 kg)  06/02/21 273 lb (123.8 kg)   BP Readings from Last 3 Encounters:  06/26/21 124/82  06/15/21 130/67  06/13/21 102/68   Pulse Readings from Last 3 Encounters:  06/26/21 60  06/15/21 82  06/13/21 77    Renal function: Estimated Creatinine Clearance: 86.6 mL/min (by C-G formula based on SCr of 1 mg/dL).  Past Medical History:  Diagnosis Date   ADHD (attention deficit hyperactivity disorder)    diagnosed at [facility] clinic.   Allergy    Anxiety    Social anxiety, generalized anxiety disorder.   Depression    Diabetes mellitus without complication ([place])    HFrEF (heart failure with reduced ejection fraction) ([place])    Hypertension     Current Outpatient Medications on File Prior to Visit  Medication Sig Dispense Refill   acetaminophen (TYLENOL) 325 MG tablet Take 2 tablets (650 mg total) by mouth every 6 (six) hours as needed for mild pain.     amiodarone (PACERONE) 200 MG tablet Take 0.5 tablets (100 mg total) by mouth daily. 45 tablet 3   FLUoxetine  (PROZAC) 40 MG capsule Take 1 capsule (40 mg total) by mouth daily. 90 capsule 1   furosemide (LASIX) 40 MG tablet Take 1 tablet (40 mg total) by mouth daily. As needed for weight gain of 3 lbs in 24 hour or 5 lbs in a week 30 tablet 6   JARDIANCE 10 MG TABS tablet Take 1 tablet by mouth once daily 30 tablet 2   metFORMIN (GLUCOPHAGE-XR) 500 MG 24 hr tablet Take 1 tablet (500 mg total) by mouth 2 (two) times daily. 60 tablet 2   metoprolol succinate (TOPROL-XL) 50 MG 24 hr tablet Take 1 tablet (50 mg total) by mouth daily. 90 tablet 3   potassium chloride SA (KLOR-CON) 20 MEQ tablet Take 1 tablet (20 mEq total) by mouth daily. As needed when lasix dose is taken 30 tablet 3   rosuvastatin (CRESTOR) 20 MG tablet Take 1 tablet by mouth once daily 90 tablet 0   sacubitril-valsartan (ENTRESTO) 49-51 MG Take 1 tablet by mouth 2 (two) times daily. 60 tablet 6   spironolactone (ALDACTONE) 25 MG tablet Take 1 tablet by mouth once daily 30 tablet 2   No current facility-administered medications on file prior to visit.  Allergies  Allergen Reactions   Sulfa Antibiotics      Assessment/Plan:  1. CHF -

## 2021-06-30 ENCOUNTER — Telehealth: Payer: Self-pay | Admitting: Pharmacist

## 2021-06-30 ENCOUNTER — Telehealth: Payer: Self-pay

## 2021-06-30 DIAGNOSIS — E119 Type 2 diabetes mellitus without complications: Secondary | ICD-10-CM

## 2021-06-30 DIAGNOSIS — E1169 Type 2 diabetes mellitus with other specified complication: Secondary | ICD-10-CM

## 2021-06-30 DIAGNOSIS — E785 Hyperlipidemia, unspecified: Secondary | ICD-10-CM

## 2021-06-30 MED ORDER — OZEMPIC (0.25 OR 0.5 MG/DOSE) 2 MG/1.5ML ~~LOC~~ SOPN: PEN_INJECTOR | SUBCUTANEOUS | 2 refills | Status: DC

## 2021-06-30 NOTE — Telephone Encounter (Signed)
Spoke with patient.  Concern regarding weight going up and down if she stopped taking Ozempic.  Advised that since CHF and DM are long term, she will likely be on the medication for a long time as well.  That made her feel more comfortable and she was willing to start medication.

## 2021-06-30 NOTE — Telephone Encounter (Signed)
Prior authorization for Ozempic approved.  Called to let patient know.  Patient reports that after thinking about medication she does not want to start and would rather focus on lifestyle changes.  Advised to call back if she changes her mind.

## 2021-06-30 NOTE — Telephone Encounter (Signed)
Patient left message and would like you to call her.  Thank you

## 2021-07-03 NOTE — Progress Notes (Signed)
Primary Care: Dr. Alcus Dad Primary HF Cardiologist: Dr. Aundra Dubin  HPI:  45 y.o. female w/ h/o systolic HF/nonischemic CM, obesity, ADHD, anxiety, depression, hypothyroidism, GERD, PACs, DM2.   Presented to ED on 02/13/21 w/ complaints of 3 week h/o progressive DOE, LEE and abdominal fullness. Found to be in acute CHF. BNP 1388. CXR w/ mild congestive CHF w/ interstitial edema. EKG showed ST 120 bpm. HS trop 48>>50.  TSH 13.416. Free T4 WNL. Chest CT negative for PE. She was admitted for CHF and started on IV Lasix. Echo showed biventricular dysfunction. LVEF severely reduced 20-25% w/ global HK. Mild LVH. RV moderately reduced w/ estimated RVSP 36 mmHg. Also found to have new diagnosis of T2DM, Hgb A1c 11. She responded well to diuresis and was transitioned to oral Lasix and discharged on GDMT.   Seen in ED on 06/13/21 with fatigue. Fluoxetine and metformin recently increased at PCP visit 06/02/21. Chest x-ray with evidence of mild interstitial edema. It was recommended to increase Lasix to 80 mg daily for 3 days. Declined COVID test as had negative result at home. No labs or ECG done.     Echo was done 06/26/21 and reviewed, EF 40-45%, mild LV enlargement, normal RV, normal IVC.    Recently returned to HF Clinic for followup of CHF 06/26/21.  She had been taking Lasix once or twice a week.  Her weight was stable.  She reported getting short of breath only with heavy exertion.  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  No problems walking on flat ground.  Rare palpitations.  No chest pain.  She was in NSR by exam.    Today she returns to HF clinic for pharmacist medication titration. At last visit with MD metoprolol succinate was increased to 50 mg daily and amiodarone was decreased to 100 mg daily. She was referred for Ozempic and received first dose 07/04/21. Unfortunately, she experienced lightheadedness and discontinued the Ozempic. Since discontinuing the Ozempic, she feels much  better. No dizziness or lightheadedness. She did have an episode of her "heart racing" last night. HR was 135 and episode lasted ~1 hour. Episode has not recurred, HR 62 in clinic today. No SOB/DOE when walking on flat ground. Weight at home has been stable, 263-264 lbs. Has not needed any PRN Lasix in 2 weeks. No LEE, PND or orthopnea. Notes that she has not been as diligent with her diet recently. Is going to try and get back into healthy habits.     HF Medications: Metoprolol succinate 50 mg daily Entresto 49/51 mg BID Spironolactone 25 mg daily Jardiance 10 mg daily Lasix 40 mg PRN Potassium chloride 20 mEq PRN (with furosemide)  Has the patient been experiencing any side effects to the medications prescribed?  Yes - lightheadedness and increased thirst with Ozempic. She has discontinued.   Does the patient have any problems obtaining medications due to transportation or finances?   No - BC[employer] Medicaid  Understanding of regimen: good Understanding of indications: good Potential of compliance: good Patient understands to avoid NSAIDs. Patient understands to avoid decongestants.    Pertinent Lab Values: 06/26/21: Serum creatinine 1.00, BUN 17, Potassium 4.2, Sodium 134, BNP 142.3 pg/mL  Vital Signs: Weight: 270.2 lbs (last clinic weight: 270.8 lbs) Blood pressure: 110/68  Heart rate: 62   Assessment/Plan: 1. Chronic systolic CHF:  New onset cardiomyopathy 01/2021, nonischemic cardiomyopathy. Echo in 01/2021 with EF 20-25%, moderately decreased RV systolic function.  LHC/RHC in 02/2021 with preserved cardiac  output, no significant coronary disease (nonischemic cardiomyopathy). Cardiac MRI with LV EF 21%, RV EF 30%, no myocardial LGE.  No ETOH, no drugs, no FH of CAD or CMP. Consider prior viral myocarditis, effects of diabetes itself, or newly-diagnosed hypothyroidism.  However, patient also has had frequent episodes of rapid atrial tachycardia in the hospital and said that her HR has  often been up to 150 bpm at home on her Apple Watch. Concern this may be tachycardia-mediated CMP.  Echo 06/26/21 showed EF up to 40-45%, normal RV.   - NYHA class II symptoms, not volume overloaded.  - Continue Lasix 40 mg PRN.  - Continue metoprolol XL 50 mg daily.  - Increase Entresto to 97/103 mg BID.  Repeat BMET in 1 month. - Continue spironolactone 25 mg daily.  - Continue Jardiance 10 mg daily. - EF is out of ICD range on 06/26/21 echo.  2. Type 2 diabetes: Per PCP.  3. Hypothyroidism: Subclinical, mild TSH elevation.  - Follow closely on amiodarone.  4. Hyperlipidemia: Continue statin.  5. Atrial tachycardia: Frequent episodes noted during 01/2021-02/2021 admission, recurred when amiodarone stopped.  On questioning, her HR has frequently been elevated as high as 150s at home though she does not feel palpitations. Concerned that she could have a tachy-mediated CMP from AT, and EF is now improving.  She has stayed in NSR on amiodarone.  - Continue amiodarone 100 mg daily.  Recent LFTs normal, TSH mildly elevated.  She will need a regular eye exam.  - EP has seen, thinks her high BMI makes ablation less likely to be successful.  Would consider in future if she can lose weight.  6. Obesity: She is working on weight loss through dietary changes and increased activity. Body mass index is 52.89 kg/m. - She discontinued Ozempic due to side effects (lightheadedness, increased thirst).  7. Snores: She has been referred for in-hospital sleep study (insurance will not cover home SS).     Follow up 1 month with APP [place], PharmD, BCPS, BCCP, CPP Heart Failure Clinic Pharmacist [phone]

## 2021-07-04 ENCOUNTER — Telehealth: Payer: Self-pay | Admitting: Pharmacist

## 2021-07-04 NOTE — Telephone Encounter (Signed)
Patient called.  Injected her first dose of Ozempic over the weekend.  At night's she had been feeling thirsty and lightheaded.  Patient is on 2L oral fluid restriction. Has only been happening in evenings.  Advised to limit water during the day and to try to have more of her water at night to see if this reduces symptoms.

## 2021-07-16 ENCOUNTER — Other Ambulatory Visit (HOSPITAL_COMMUNITY): Payer: Self-pay | Admitting: Cardiology

## 2021-07-17 ENCOUNTER — Telehealth (HOSPITAL_COMMUNITY): Payer: Self-pay | Admitting: Licensed Clinical Social Worker

## 2021-07-17 NOTE — Telephone Encounter (Signed)
CSW called pt to remind of [facility] this Wednesday.  Unable to reach- left VM with date/time/location of group and encouraged to reach out with any questions.    Jorge Ny, LCSW Clinical Social Worker [facility] Desk#: [phone] Cell#: [phone]

## 2021-07-19 ENCOUNTER — Other Ambulatory Visit: Payer: Self-pay

## 2021-07-19 ENCOUNTER — Ambulatory Visit (HOSPITAL_COMMUNITY)
Admission: RE | Admit: 2021-07-19 | Discharge: 2021-07-19 | Disposition: A | Discharge status: Home / Self Care | Payer: Medicaid Other | Source: Ambulatory Visit | Attending: Cardiology | Admitting: Cardiology

## 2021-07-19 ENCOUNTER — Telehealth (HOSPITAL_COMMUNITY): Payer: Self-pay | Admitting: *Deleted

## 2021-07-19 VITALS — BP 110/68 | HR 62 | Wt 270.2 lb

## 2021-07-19 DIAGNOSIS — Z7984 Long term (current) use of oral hypoglycemic drugs: Secondary | ICD-10-CM | POA: Insufficient documentation

## 2021-07-19 DIAGNOSIS — F419 Anxiety disorder, unspecified: Secondary | ICD-10-CM | POA: Insufficient documentation

## 2021-07-19 DIAGNOSIS — F909 Attention-deficit hyperactivity disorder, unspecified type: Secondary | ICD-10-CM | POA: Diagnosis not present

## 2021-07-19 DIAGNOSIS — F32A Depression, unspecified: Secondary | ICD-10-CM | POA: Diagnosis not present

## 2021-07-19 DIAGNOSIS — K219 Gastro-esophageal reflux disease without esophagitis: Secondary | ICD-10-CM | POA: Insufficient documentation

## 2021-07-19 DIAGNOSIS — I5022 Chronic systolic (congestive) heart failure: Secondary | ICD-10-CM | POA: Diagnosis present

## 2021-07-19 DIAGNOSIS — E039 Hypothyroidism, unspecified: Secondary | ICD-10-CM | POA: Insufficient documentation

## 2021-07-19 DIAGNOSIS — Z6841 Body Mass Index (BMI) 40.0 and over, adult: Secondary | ICD-10-CM | POA: Diagnosis not present

## 2021-07-19 DIAGNOSIS — E119 Type 2 diabetes mellitus without complications: Secondary | ICD-10-CM | POA: Diagnosis not present

## 2021-07-19 DIAGNOSIS — R0683 Snoring: Secondary | ICD-10-CM | POA: Insufficient documentation

## 2021-07-19 DIAGNOSIS — R946 Abnormal results of thyroid function studies: Secondary | ICD-10-CM | POA: Diagnosis not present

## 2021-07-19 DIAGNOSIS — Z79899 Other long term (current) drug therapy: Secondary | ICD-10-CM | POA: Insufficient documentation

## 2021-07-19 DIAGNOSIS — E785 Hyperlipidemia, unspecified: Secondary | ICD-10-CM | POA: Insufficient documentation

## 2021-07-19 DIAGNOSIS — I471 Supraventricular tachycardia: Secondary | ICD-10-CM | POA: Insufficient documentation

## 2021-07-19 DIAGNOSIS — E669 Obesity, unspecified: Secondary | ICD-10-CM | POA: Insufficient documentation

## 2021-07-19 MED ORDER — SACUBITRIL-VALSARTAN 97-103 MG PO TABS: 1.0000 | ORAL_TABLET | Freq: Two times a day (BID) | ORAL | 11 refills | Status: DC

## 2021-07-19 NOTE — Patient Instructions (Addendum)
It was a pleasure seeing you today!  MEDICATIONS: -We are changing your medications today -Increase Entresto to 97/103 mg (1 tablet) twice daily. You may take 2 tablets of the 49/51 mg strength twice daily until you receive the new strength.   -Call if you have questions about your medications.   NEXT APPOINTMENT: Return to clinic in 1 month with [facility].  In general, to take care of your heart failure: -Limit your fluid intake to 2 Liters (half-gallon) per day.   -Limit your salt intake to ideally 2-3 grams (2000-3000 mg) per day. -Weigh yourself daily and record, and bring that "weight diary" to your next appointment.  (Weight gain of 2-3 pounds in 1 day typically means fluid weight.) -The medications for your heart are to help your heart and help you live longer.   -Please contact us before stopping any of your heart medications.  Call the clinic at [phone] with questions or to reschedule future appointments.

## 2021-07-19 NOTE — Telephone Encounter (Signed)
Pt left VM after 5pm yesterday stating her heart rate was 135 for about 2.5 hours. Pt has office visit today at 10am.

## 2021-08-13 ENCOUNTER — Other Ambulatory Visit: Payer: Self-pay | Admitting: Family Medicine

## 2021-08-25 NOTE — Progress Notes (Signed)
ADVANCED HF CLINIC NOTE  Primary Care: Dr. Alcus Dad Primary HF Cardiologist: Dr. Aundra Dubin  HPI: 45 y.o. female w/ h/o systolic HF/nonischemic CM, obesity, ADHD, anxiety, depression, hypothyroidism, GERD, PACs, DM2.  Presented to ED on 02/13/21 w/ complaints of 3 week h/o progressive DOE, LEE and abdominal fullness. Found to be in acute CHF. BNP 1388. CXR w/ mild congestive CHF w/ interstitial edema. EKG showed ST 120 bpm. HS trop 48>>50.  TSH 13.416. Free T4 WNL. Chest CT negative for PE. She was admitted for CHF and started on IV Lasix. Echo showed biventricular dysfunction. LVEF severely reduced 20-25% w/ global HK. Mild LVH. RV moderately reduced w/ estimated RVSP 36 mmHg. Also found to have new diagnosis of T2D, Hgb A1c 11. She responded well to diuresis and was transitioned to oral Lasix and discharged on GDMT.  Seen in ED on 06/13/21 with fatigue. Fluoxetine and metformin recently increased at PCP visit 09/16. Chest x-ray with evidence of mild interstitial edema. It was recommended to increase lasix to 80 mg daily for 3 days. Declined COVID test as had negative result at home. No labs or ECG done.    Echo 10/22 EF 40-45%, mild LV enlargement, normal RV, normal IVC.   Today she returns for HF follow up. Has more stress lately, husband had right foot amputated and she is caring for him. Overall feeling fine. No significant dyspnea with ADLs or at work at the daycare, limited more by fatigue at work. Denies palpitations, CP, dizziness, edema, or PND/Orthopnea. Appetite ok. No fever or chills. Weight at home 265 pounds. Taking all medications, has not taken Lasix recently. No longer taking Ozempic as it caused HA and increased thirst. Apple watch has not shown any sustained HR spikes.  ECG (personally reviewed): SR 60 bpm, no ectopy  Labs (9/22): TSH mildly elevated, K 4, creatinine 1.07, LFTs normal Labs (10/22): K 4.2, creatinine 1.0  PMH: 1. Type 2 diabetes 2. ADHD 3. HTN 4.  Depression 5. GERD 6. Obesity 7. Chronic systolic CHF:  Nonischemic cardiomyopathy.  - Echo (5/22) with EF 20-25%, global HK, moderately decreased RV systolic function.  - RHC/LHC: No significant CAD; mean RA 6, PA 53/20 mean 30, mean PCWP 20, CI (Fick) 2.74, PVR 1.7 WU.  - Cardiac MRI (6/22): Mildly dilated LV with EF 21%, RV EF 30%, no definite LGE (difficult images).  - Echo (10/22) with EF 40-45%, mild LV enlargement, normal RV.  8. Hypothyroidism 9. Atrial tachycardia 10. Hyperlipidemia.   ROS: All systems reviewed and negative except as per HPI.   FH: No family history of cardiomyopathy.    Current Outpatient Medications  Medication Sig Dispense Refill   acetaminophen (TYLENOL) 325 MG tablet Take 2 tablets (650 mg total) by mouth every 6 (six) hours as needed for mild pain.     amiodarone (PACERONE) 200 MG tablet Take 0.5 tablets (100 mg total) by mouth daily. 45 tablet 3   FLUoxetine (PROZAC) 40 MG capsule Take 1 capsule (40 mg total) by mouth daily. 90 capsule 1   furosemide (LASIX) 40 MG tablet Take 1 tablet (40 mg total) by mouth daily. As needed for weight gain of 3 lbs in 24 hour or 5 lbs in a week 30 tablet 6   JARDIANCE 10 MG TABS tablet Take 1 tablet by mouth once daily 30 tablet 0   metFORMIN (GLUCOPHAGE-XR) 500 MG 24 hr tablet Take 1 tablet (500 mg total) by mouth 2 (two) times daily. 60 tablet 2  metoprolol succinate (TOPROL-XL) 50 MG 24 hr tablet Take 1 tablet (50 mg total) by mouth daily. 90 tablet 3   potassium chloride SA (KLOR-CON) 20 MEQ tablet Take 1 tablet (20 mEq total) by mouth daily. As needed when lasix dose is taken 30 tablet 3   rosuvastatin (CRESTOR) 20 MG tablet Take 1 tablet by mouth once daily 90 tablet 0   sacubitril-valsartan (ENTRESTO) 97-103 MG Take 1 tablet by mouth 2 (two) times daily. 60 tablet 11   spironolactone (ALDACTONE) 25 MG tablet Take 1 tablet by mouth once daily 30 tablet 3   No current facility-administered medications for this  encounter.   Allergies  Allergen Reactions   Sulfa Antibiotics    Social History   Socioeconomic History   Marital status: Married    Spouse name: Not on file   Number of children: Not on file   Years of education: Not on file   Highest education level: Not on file  Occupational History   Not on file  Tobacco Use   Smoking status: Former    Types: Cigarettes    Quit date: 01/12/2010    Years since quitting: 11.6   Smokeless tobacco: Never  Vaping Use   Vaping Use: Never used  Substance and Sexual Activity   Alcohol use: No   Drug use: No   Sexual activity: Not on file  Other Topics Concern   Not on file  Social History Narrative   Marital status: divorced and engaged since 2011.     Children: 2 children      Lives with fiance and two children (71, 10)      Employment: childcare early development; 3s and 4s. Surveyor, quantity      Tobacco: none      Alcohol: none      Drugs; None      Exercise: sporadic.           Social Determinants of Health   Financial Resource Strain: Medium Risk   Difficulty of Paying Living Expenses: Somewhat hard  Food Insecurity: No Food Insecurity   Worried About Charity fundraiser in the Last Year: Never true   Ran Out of Food in the Last Year: Never true  Transportation Needs: No Transportation Needs   Lack of Transportation (Medical): No   Lack of Transportation (Non-Medical): No  Physical Activity: Not on file  Stress: Not on file  Social Connections: Not on file  Intimate Partner Violence: Not on file   BP (!) 110/57   Pulse 67   Wt 120.2 kg   SpO2 93%   BMI 51.75 kg/m   Wt Readings from Last 3 Encounters:  08/29/21 120.2 kg  07/19/21 122.6 kg  06/29/21 124.3 kg   PHYSICAL EXAM: General:  NAD. No resp difficulty HEENT: Normal Neck: Supple. No JVD. Carotids 2+ bilat; no bruits. No lymphadenopathy or thryomegaly appreciated. Cor: PMI nondisplaced. Regular rate & rhythm. No rubs, gallops or murmurs. Lungs:  Clear Abdomen: Obese, nontender, nondistended. No hepatosplenomegaly. No bruits or masses. Good bowel sounds. Extremities: No cyanosis, clubbing, rash, edema Neuro: Alert & oriented x 3, cranial nerves grossly intact. Moves all 4 extremities w/o difficulty. Affect pleasant.  ASSESSMENT & PLAN:  1. Chronic systolic CHF:  New onset cardiomyopathy 5/22, nonischemic cardiomyopathy. Echo in 5/22 with EF 20-25%, moderately decreased RV systolic function.  LHC/RHC in 6/22 with preserved cardiac output, no significant coronary disease (nonischemic cardiomyopathy). Cardiac MRI with LV EF 21%, RV EF 30%, no myocardial LGE.  No ETOH, no drugs, no FH of CAD or CMP. Consider prior viral myocarditis, effects of diabetes itself, or newly-diagnosed hypothyroidism.  However, patient also has had frequent episodes of rapid atrial tachycardia in the hospital and said that her HR has often been up to 150 bpm at home on her Apple Watch. Concern this may be tachycardia-mediated CMP.  Echo 10/22 showed EF up to 40-45%, normal RV.  NYHA class II symptoms, not volume overloaded.  - Continue Lasix 40 mg prn.  - Continue Entresto 97/103 mg bid.  BMET today. - Continue sprionolactone 25 mg daily.  - Continue Toprol XL 50 mg daily.  - Continue Jardiance 10 mg daily. - EF is out of ICD range. 2. Type 2 diabetes: Per PCP.  3. Hypothyroidism: Subclinical, mild TSH elevation.  - Follow closely on amiodarone.  4. Hyperlipidemia: Continue statin.  5. Atrial tachycardia: Frequent episodes noted during 5/22-6/22 admission, recurred when amiodarone stopped. Concerned that she could have a tachy-mediated CMP from AT, and EF is now improving.  She has stayed in NSR on amiodarone. No further HR spikes or sustained elevated heart rates on her smart watch. Denies palpitations. - Amiodarone reduced to 100 mg daily last visit. OK to stop today. If she notices palpitations or sustained increase in HR on her watch, plan to restart. She will  notify clinic if this occurs. - EP has seen, thinks her high BMI makes ablation less likely to be successful.  Would consider in future if she can lose weight.  6. Obesity: She is working on weight loss through dietary changes and increased activity. Body mass index is 51.75 kg/m. - She did not tolerate semaglutide.  7. Snores: She has been referred for in-hospital sleep study (insurance will not cover home SS).   Followup in 3 months with Dr. Aundra Dubin.  [place] FNP 08/29/2021

## 2021-08-28 ENCOUNTER — Encounter (HOSPITAL_COMMUNITY): Payer: Medicaid Other

## 2021-08-29 ENCOUNTER — Encounter (HOSPITAL_COMMUNITY): Payer: Self-pay

## 2021-08-29 ENCOUNTER — Ambulatory Visit (HOSPITAL_COMMUNITY)
Admission: RE | Admit: 2021-08-29 | Discharge: 2021-08-29 | Disposition: A | Discharge status: Home / Self Care | Payer: Medicaid Other | Source: Ambulatory Visit | Attending: Family Medicine | Admitting: Family Medicine

## 2021-08-29 ENCOUNTER — Other Ambulatory Visit: Payer: Self-pay

## 2021-08-29 VITALS — BP 110/57 | HR 67 | Wt 265.0 lb

## 2021-08-29 DIAGNOSIS — Z87891 Personal history of nicotine dependence: Secondary | ICD-10-CM | POA: Diagnosis not present

## 2021-08-29 DIAGNOSIS — Z7984 Long term (current) use of oral hypoglycemic drugs: Secondary | ICD-10-CM | POA: Diagnosis not present

## 2021-08-29 DIAGNOSIS — F419 Anxiety disorder, unspecified: Secondary | ICD-10-CM | POA: Insufficient documentation

## 2021-08-29 DIAGNOSIS — Z6841 Body Mass Index (BMI) 40.0 and over, adult: Secondary | ICD-10-CM | POA: Insufficient documentation

## 2021-08-29 DIAGNOSIS — I428 Other cardiomyopathies: Secondary | ICD-10-CM | POA: Diagnosis not present

## 2021-08-29 DIAGNOSIS — Z09 Encounter for follow-up examination after completed treatment for conditions other than malignant neoplasm: Secondary | ICD-10-CM | POA: Diagnosis not present

## 2021-08-29 DIAGNOSIS — E119 Type 2 diabetes mellitus without complications: Secondary | ICD-10-CM

## 2021-08-29 DIAGNOSIS — R0683 Snoring: Secondary | ICD-10-CM | POA: Diagnosis not present

## 2021-08-29 DIAGNOSIS — E669 Obesity, unspecified: Secondary | ICD-10-CM | POA: Diagnosis not present

## 2021-08-29 DIAGNOSIS — I5022 Chronic systolic (congestive) heart failure: Secondary | ICD-10-CM | POA: Insufficient documentation

## 2021-08-29 DIAGNOSIS — Z79899 Other long term (current) drug therapy: Secondary | ICD-10-CM | POA: Diagnosis not present

## 2021-08-29 DIAGNOSIS — Z7901 Long term (current) use of anticoagulants: Secondary | ICD-10-CM | POA: Insufficient documentation

## 2021-08-29 DIAGNOSIS — F32A Depression, unspecified: Secondary | ICD-10-CM | POA: Diagnosis not present

## 2021-08-29 DIAGNOSIS — K219 Gastro-esophageal reflux disease without esophagitis: Secondary | ICD-10-CM | POA: Diagnosis not present

## 2021-08-29 DIAGNOSIS — I110 Hypertensive heart disease with heart failure: Secondary | ICD-10-CM | POA: Insufficient documentation

## 2021-08-29 DIAGNOSIS — E039 Hypothyroidism, unspecified: Secondary | ICD-10-CM | POA: Diagnosis not present

## 2021-08-29 DIAGNOSIS — I471 Supraventricular tachycardia: Secondary | ICD-10-CM | POA: Diagnosis not present

## 2021-08-29 DIAGNOSIS — E1169 Type 2 diabetes mellitus with other specified complication: Secondary | ICD-10-CM | POA: Diagnosis not present

## 2021-08-29 DIAGNOSIS — E785 Hyperlipidemia, unspecified: Secondary | ICD-10-CM | POA: Diagnosis not present

## 2021-08-29 DIAGNOSIS — I5020 Unspecified systolic (congestive) heart failure: Secondary | ICD-10-CM

## 2021-08-29 LAB — BASIC METABOLIC PANEL
Anion gap: 9 (ref 5–15)
BUN: 15 mg/dL (ref 6–20)
CO2: 21 mmol/L — ABNORMAL LOW (ref 22–32)
Calcium: 8.9 mg/dL (ref 8.9–10.3)
Chloride: 105 mmol/L (ref 98–111)
Creatinine, Ser: 0.86 mg/dL (ref 0.44–1.00)
GFR, Estimated: >60 mL/min (ref >60)
Glucose, Bld: 156 mg/dL — ABNORMAL HIGH (ref 70–99)
Potassium: 3.5 mmol/L (ref 3.5–5.1)
Sodium: 135 mmol/L (ref 135–145)

## 2021-08-29 NOTE — Patient Instructions (Addendum)
STOP Amiodarone  Labs today We will only contact you if something comes back abnormal or we need to make some changes. Otherwise no news is good news!  Notify clinic if you feel palpitations or if your smart watch alerts you to sustained and elevated heart rates.  Your physician recommends that you schedule a follow-up appointment in: 3 months with Dr Aundra Dubin  Do the following things EVERYDAY: Weigh yourself in the morning before breakfast. Write it down and keep it in a log. Take your medicines as prescribed Eat low salt foods--Limit salt (sodium) to 2000 mg per day.  Stay as active as you can everyday Limit all fluids for the day to less than 2 liters  At the [facility], you and your health needs are our priority. As part of our continuing mission to provide you with exceptional heart care, we have created designated Provider Care Teams. These Care Teams include your primary Cardiologist (physician) and Advanced Practice Providers (APPs- Physician Assistants and Nurse Practitioners) who all work together to provide you with the care you need, when you need it.   You may see any of the following providers on your designated Care Team at your next follow up: Dr Glori Bickers Dr Haynes Kerns, NP Lyda Jester, [place] [facility] [place], [place] Audry Riles, PharmD   Please be sure to bring in all your medications bottles to every appointment.   If you have any questions or concerns before your next appointment please send [place] a message through [place] or call our office at [phone].    TO LEAVE A MESSAGE FOR THE NURSE SELECT OPTION 2, PLEASE LEAVE A MESSAGE INCLUDING: YOUR NAME DATE OF BIRTH CALL BACK NUMBER REASON FOR CALL**this is important as we prioritize the call backs  YOU WILL RECEIVE A CALL BACK THE SAME DAY AS LONG AS YOU CALL BEFORE 4:00 PM

## 2021-09-11 ENCOUNTER — Other Ambulatory Visit: Payer: Self-pay | Admitting: Family Medicine

## 2021-09-11 DIAGNOSIS — E119 Type 2 diabetes mellitus without complications: Secondary | ICD-10-CM

## 2021-09-17 ENCOUNTER — Other Ambulatory Visit: Payer: Self-pay | Admitting: Family Medicine

## 2021-10-16 ENCOUNTER — Telehealth (HOSPITAL_COMMUNITY): Payer: Self-pay | Admitting: Licensed Clinical Social Worker

## 2021-10-16 NOTE — Telephone Encounter (Signed)
CSW called pt to remind of [facility]om[facility] this coming Wednesday at 10am- unable to reach- left VM with details of meeting  Vickie Graham, [facility] Desk#: [phone] Cell#: [phone]

## 2021-10-22 ENCOUNTER — Other Ambulatory Visit: Payer: Self-pay | Admitting: Family Medicine

## 2021-11-15 ENCOUNTER — Telehealth (HOSPITAL_COMMUNITY): Payer: Self-pay | Admitting: Licensed Clinical Social Worker

## 2021-11-15 NOTE — Telephone Encounter (Signed)
Pt attended and participated in [facility] Women[facility] Support Group.   ? ?Kiani Wurtzel H. Yurianna Tusing, LCSW ?Clinical Social Worker ?[facility] ?Desk#: [phone] ?Cell#: [phone] ? ?

## 2021-11-17 ENCOUNTER — Telehealth (HOSPITAL_COMMUNITY): Payer: Self-pay | Admitting: *Deleted

## 2021-11-17 NOTE — Telephone Encounter (Signed)
Patient referred to Clinical Exercise Physiologist by CSW, Tammy Sours, for guidance and discussion about safe home exercises and/or starting an exercise program. Contacted patient via telephone and left message to provide support regarding safe exercises. Will try to contact patient again.  ? ? ? ?Kathy Breach, MS, ACSM, [facility] ?Clinical Exercise Physiologist/ Health and Wellness Coach  ? ?

## 2021-11-19 ENCOUNTER — Other Ambulatory Visit (HOSPITAL_COMMUNITY): Payer: Self-pay | Admitting: Cardiology

## 2021-11-22 ENCOUNTER — Telehealth (HOSPITAL_COMMUNITY): Payer: Self-pay | Admitting: *Deleted

## 2021-11-22 NOTE — Telephone Encounter (Signed)
Made a second attempt to reach patient to provide support and information for exercise education and program coordination. Left voicemail and information for her to contact if she is still interested.  ? ? ?Kathy Breach, MS, ACSM, [facility] ?Clinical Exercise Physiologist/ Health and Wellness Coach  ? ?

## 2021-11-29 ENCOUNTER — Telehealth: Payer: Self-pay | Admitting: Licensed Clinical Social Worker

## 2021-11-29 NOTE — Telephone Encounter (Signed)
Mailed pt resources around mindfulness/stress and healthy eating that were discussed during Women's Group. These were from previous sessions.  ? ?Westley Hummer, MSW, LCSW ?Clinical Social Worker II ?[facility]  ?[phone]- work cell phone (preferred) ?[phone]- desk phone ? ?

## 2021-11-30 ENCOUNTER — Encounter (HOSPITAL_COMMUNITY): Payer: Self-pay | Admitting: Cardiology

## 2021-11-30 ENCOUNTER — Ambulatory Visit (HOSPITAL_COMMUNITY)
Admission: RE | Admit: 2021-11-30 | Discharge: 2021-11-30 | Disposition: A | Discharge status: Home / Self Care | Payer: Medicaid Other | Source: Ambulatory Visit | Attending: Cardiology | Admitting: Cardiology

## 2021-11-30 ENCOUNTER — Other Ambulatory Visit: Payer: Self-pay

## 2021-11-30 VITALS — BP 130/80 | HR 60 | Wt 259.8 lb

## 2021-11-30 DIAGNOSIS — E785 Hyperlipidemia, unspecified: Secondary | ICD-10-CM | POA: Diagnosis not present

## 2021-11-30 DIAGNOSIS — F419 Anxiety disorder, unspecified: Secondary | ICD-10-CM | POA: Diagnosis not present

## 2021-11-30 DIAGNOSIS — Z79899 Other long term (current) drug therapy: Secondary | ICD-10-CM | POA: Diagnosis not present

## 2021-11-30 DIAGNOSIS — F32A Depression, unspecified: Secondary | ICD-10-CM | POA: Diagnosis not present

## 2021-11-30 DIAGNOSIS — E669 Obesity, unspecified: Secondary | ICD-10-CM | POA: Insufficient documentation

## 2021-11-30 DIAGNOSIS — Z6841 Body Mass Index (BMI) 40.0 and over, adult: Secondary | ICD-10-CM | POA: Diagnosis not present

## 2021-11-30 DIAGNOSIS — I110 Hypertensive heart disease with heart failure: Secondary | ICD-10-CM | POA: Diagnosis not present

## 2021-11-30 DIAGNOSIS — R0683 Snoring: Secondary | ICD-10-CM | POA: Insufficient documentation

## 2021-11-30 DIAGNOSIS — I5022 Chronic systolic (congestive) heart failure: Secondary | ICD-10-CM | POA: Insufficient documentation

## 2021-11-30 DIAGNOSIS — I5020 Unspecified systolic (congestive) heart failure: Secondary | ICD-10-CM | POA: Diagnosis not present

## 2021-11-30 DIAGNOSIS — E119 Type 2 diabetes mellitus without complications: Secondary | ICD-10-CM | POA: Diagnosis not present

## 2021-11-30 DIAGNOSIS — E039 Hypothyroidism, unspecified: Secondary | ICD-10-CM | POA: Diagnosis not present

## 2021-11-30 DIAGNOSIS — I471 Supraventricular tachycardia: Secondary | ICD-10-CM | POA: Diagnosis not present

## 2021-11-30 DIAGNOSIS — K219 Gastro-esophageal reflux disease without esophagitis: Secondary | ICD-10-CM | POA: Diagnosis not present

## 2021-11-30 DIAGNOSIS — Z7984 Long term (current) use of oral hypoglycemic drugs: Secondary | ICD-10-CM | POA: Insufficient documentation

## 2021-11-30 DIAGNOSIS — I428 Other cardiomyopathies: Secondary | ICD-10-CM | POA: Diagnosis not present

## 2021-11-30 DIAGNOSIS — I4719 Other supraventricular tachycardia: Secondary | ICD-10-CM

## 2021-11-30 LAB — BASIC METABOLIC PANEL
Anion gap: 10 (ref 5–15)
BUN: 20 mg/dL (ref 6–20)
CO2: 26 mmol/L (ref 22–32)
Calcium: 9.5 mg/dL (ref 8.9–10.3)
Chloride: 102 mmol/L (ref 98–111)
Creatinine, Ser: 0.89 mg/dL (ref 0.44–1.00)
GFR, Estimated: >60 mL/min (ref >60)
Glucose, Bld: 137 mg/dL — ABNORMAL HIGH (ref 70–99)
Potassium: 4 mmol/L (ref 3.5–5.1)
Sodium: 138 mmol/L (ref 135–145)

## 2021-11-30 LAB — BRAIN NATRIURETIC PEPTIDE: B Natriuretic Peptide: 75.1 pg/mL (ref 0.0–100.0)

## 2021-11-30 MED ORDER — METOPROLOL SUCCINATE ER 100 MG PO TB24: 100.0000 mg | ORAL_TABLET | Freq: Every day | ORAL | 11 refills | Status: DC

## 2021-11-30 NOTE — Patient Instructions (Signed)
Medication Changes: ? ?Increase Toprol XL to 100 mg daily ? ?Lab Work: ? ?Labs done today, your results will be available in MyChart, we will contact you for abnormal readings. ? ? ?Testing/Procedures: ? ?Your physician has recommended that you have a sleep study. This test records several body functions during sleep, including: brain activity, eye movement, oxygen and carbon dioxide blood levels, heart rate and rhythm, breathing rate and rhythm, the flow of air through your mouth and nose, snoring, body muscle movements, and chest and belly movement. ? ? ?Referrals: ? ?Please follow up with our heart failure pharmacist in 3 weeks ? ? ?Special Instructions // Education: ? ?none ? ?Follow-Up in: 3-4 months  ? ?At the [facility], you and your health needs are our priority. We have a designated team specialized in the treatment of Heart Failure. This Care Team includes your primary Heart Failure Specialized Cardiologist (physician), Advanced Practice Providers (APPs- Physician Assistants and Nurse Practitioners), and Pharmacist who all work together to provide you with the care you need, when you need it.  ? ?You may see any of the following providers on your designated Care Team at your next follow up: ? ?Dr Glori Bickers ?Dr Loralie Champagne ?Darrick Grinder, NP ?Lyda Jester, PA ?Jessica Milford,NP ?Marlyce Huge, PA ?Audry Riles, PharmD ? ? ?Please be sure to bring in all your medications bottles to every appointment.  ? ?Need to Contact us: ? ?If you have any questions or concerns before your next appointment please send [place] a message through [place] or call our office at [phone].   ? ?TO LEAVE A MESSAGE FOR THE NURSE SELECT OPTION 2, PLEASE LEAVE A MESSAGE INCLUDING: ?YOUR NAME ?DATE OF BIRTH ?CALL BACK NUMBER ?REASON FOR CALL**this is important as we prioritize the call backs ? ?YOU WILL RECEIVE A CALL BACK THE SAME DAY AS LONG AS YOU CALL BEFORE 4:00 PM ? ? ?

## 2021-11-30 NOTE — Progress Notes (Signed)
? ?ADVANCED HF CLINIC NOTE ? ?Primary Care: Dr. Alcus Dad ?Primary HF Cardiologist: Dr. Aundra Dubin ? ?HPI: ?46 y.o. female w/ h/o systolic HF/nonischemic CM, obesity, ADHD, anxiety, depression, hypothyroidism, GERD, PACs, DM2. ? ?Presented to ED on 02/13/21 w/ complaints of 3 week h/o progressive DOE, LEE and abdominal fullness. Found to be in acute CHF. BNP 1388. CXR w/ mild congestive CHF w/ interstitial edema. EKG showed ST 120 bpm. HS trop 48>>50.  TSH 13.416. Free T4 WNL. Chest CT negative for PE. She was admitted for CHF and started on IV Lasix. Echo showed biventricular dysfunction. LVEF severely reduced 20-25% w/ global HK. Mild LVH. RV moderately reduced w/ estimated RVSP 36 mmHg. Also found to have new diagnosis of T2D, Hgb A1c 11. She responded well to diuresis and was transitioned to oral Lasix and discharged on GDMT. ? ?Seen in ED on 06/13/21 with fatigue. Fluoxetine and metformin recently increased at PCP visit 09/16. Chest x-ray with evidence of mild interstitial edema. It was recommended to increase lasix to 80 mg daily for 3 days. Declined COVID test as had negative result at home. No labs or ECG done.   ? ?Echo in 10/22 showed EF 40-45%, mild LV enlargement, normal RV, normal IVC.  ? ?She returns today for followup of CHF.  She rarely takes Lasix.  She stopped Crestor due to fatigue and feels better off it. She tried semaglutide for weight loss but was unable to tolerate it. She has occasional episodes of palpitations and tachycardia when under stress, but this tends to be rare.  No significant exertional dyspnea.  No lightheadedness.  Not getting much exercise.  ? ?Labs (9/22): TSH mildly elevated, K 4, creatinine 1.07, LFTs normal ?Labs (12/22): K 3.5, creatinine 0.86 ? ?PMH: ?1. Type 2 diabetes ?2. ADHD ?3. HTN ?4. Depression ?5. GERD ?6. Obesity ?7. Chronic systolic CHF:  Nonischemic cardiomyopathy.  ?- Echo (5/22) with EF 20-25%, global HK, moderately decreased RV systolic function.  ?-  RHC/LHC: No significant CAD; mean RA 6, PA 53/20 mean 30, mean PCWP 20, CI (Fick) 2.74, PVR 1.7 WU.  ?- Cardiac MRI (6/22): Mildly dilated LV with EF 21%, RV EF 30%, no definite LGE (difficult images).  ?- Echo (10/22) with EF 40-45%, mild LV enlargement, normal RV.  ?8. Hypothyroidism ?9. Atrial tachycardia ?10. Hyperlipidemia.  ? ?ROS: All systems reviewed and negative except as per HPI.  ? ?FH: No family history of cardiomyopathy.  ? ? ?Current Outpatient Medications  ?Medication Sig Dispense Refill  ? acetaminophen (TYLENOL) 325 MG tablet Take 2 tablets (650 mg total) by mouth every 6 (six) hours as needed for mild pain.    ? FLUoxetine (PROZAC) 40 MG capsule Take 1 capsule (40 mg total) by mouth daily. 90 capsule 1  ? furosemide (LASIX) 40 MG tablet Take 1 tablet (40 mg total) by mouth daily. As needed for weight gain of 3 lbs in 24 hour or 5 lbs in a week 30 tablet 6  ? JARDIANCE 10 MG TABS tablet Take 1 tablet by mouth once daily 60 tablet 5  ? metFORMIN (GLUCOPHAGE-XR) 500 MG 24 hr tablet Take 1 tablet by mouth twice daily 60 tablet 2  ? potassium chloride SA (KLOR-CON) 20 MEQ tablet Take 1 tablet (20 mEq total) by mouth daily. As needed when lasix dose is taken 30 tablet 3  ? sacubitril-valsartan (ENTRESTO) 97-103 MG Take 1 tablet by mouth 2 (two) times daily. 60 tablet 11  ? spironolactone (ALDACTONE) 25 MG tablet Take  1 tablet by mouth once daily 30 tablet 0  ? metoprolol succinate (TOPROL-XL) 100 MG 24 hr tablet Take 1 tablet (100 mg total) by mouth daily. 30 tablet 11  ? ?No current facility-administered medications for this encounter.  ? ?Allergies  ?Allergen Reactions  ? Sulfa Antibiotics   ? ?Social History  ? ?Socioeconomic History  ? Marital status: Married  ?  Spouse name: Not on file  ? Number of children: Not on file  ? Years of education: Not on file  ? Highest education level: Not on file  ?Occupational History  ? Not on file  ?Tobacco Use  ? Smoking status: Former  ?  Types: Cigarettes  ?   Quit date: 01/12/2010  ?  Years since quitting: 11.8  ? Smokeless tobacco: Never  ?Vaping Use  ? Vaping Use: Never used  ?Substance and Sexual Activity  ? Alcohol use: No  ? Drug use: No  ? Sexual activity: Not on file  ?Other Topics Concern  ? Not on file  ?Social History Narrative  ? Marital status: divorced and engaged since 2011.  ?   Children: 2 children  ?    Lives with fiance and two children (70, 10)  ?    Employment: childcare early development; 3s and 4s. Surveyor, quantity  ?    Tobacco: none  ?    Alcohol: none  ?    Drugs; None  ?    Exercise: sporadic.  ?   ?     ? ?Social Determinants of Health  ? ?Financial Resource Strain: Medium Risk  ? Difficulty of Paying Living Expenses: Somewhat hard  ?Food Insecurity: No Food Insecurity  ? Worried About Charity fundraiser in the Last Year: Never true  ? Ran Out of Food in the Last Year: Never true  ?Transportation Needs: No Transportation Needs  ? Lack of Transportation (Medical): No  ? Lack of Transportation (Non-Medical): No  ?Physical Activity: Not on file  ?Stress: Not on file  ?Social Connections: Not on file  ?Intimate Partner Violence: Not on file  ? ? ?BP 130/80   Pulse 60   Wt 117.8 kg (259 lb 12.8 oz)   SpO2 96%   BMI 50.74 kg/m?  ? ?Wt Readings from Last 3 Encounters:  ?11/30/21 117.8 kg (259 lb 12.8 oz)  ?08/29/21 120.2 kg (265 lb)  ?07/19/21 122.6 kg (270 lb 3.2 oz)  ? ?PHYSICAL EXAM: ?General: NAD, obese ?Neck: No JVD, no thyromegaly or thyroid nodule.  ?Lungs: Clear to auscultation bilaterally with normal respiratory effort. ?CV: Nondisplaced PMI.  Heart regular S1/S2, no S3/S4, no murmur.  No peripheral edema.  No carotid bruit.  Normal pedal pulses.  ?Abdomen: Soft, nontender, no hepatosplenomegaly, no distention.  ?Skin: Intact without lesions or rashes.  ?Neurologic: Alert and oriented x 3.  ?Psych: Normal affect. ?Extremities: No clubbing or cyanosis.  ?HEENT: Normal.  ? ?ASSESSMENT & PLAN:  ?1. Chronic systolic CHF:  New onset  cardiomyopathy 5/22, nonischemic cardiomyopathy. Echo in 5/22 with EF 20-25%, moderately decreased RV systolic function.  LHC/RHC in 6/22 with preserved cardiac output, no significant coronary disease (nonischemic cardiomyopathy). Cardiac MRI with LV EF 21%, RV EF 30%, no myocardial LGE.  No ETOH, no drugs, no FH of CAD or CMP. Consider prior viral myocarditis, effects of diabetes itself, or newly-diagnosed hypothyroidism.  However, patient also had frequent episodes of rapid atrial tachycardia in the hospital and said that her HR had often been up to 150 bpm at  home on her Apple Watch. Concern this may be tachycardia-mediated CMP.  Echo in 11/22 showed EF up to 40-45%, normal RV.  NYHA class II symptoms, not volume overloaded.  ?- Continue Lasix 40 mg prn.  ?- Continue Entresto 97/103 mg bid.   ?- Continue sprionolactone 25 mg daily.  ?- Increase Toprol XL to 100 mg daily.  ?- Continue Jardiance 10 mg daily. ?- BMET/BNP today.  ?- EF is out of ICD range on last echo.  ?2. Type 2 diabetes: Per PCP.  ?3. Hypothyroidism: Subclinical, mild TSH elevation.  ?4. Hyperlipidemia: Unable to tolerate statin.  ?- I will check lipids today off statin.  ?5. Atrial tachycardia: Frequent episodes noted during 5/22-6/22 admission, recurred when amiodarone stopped.  On questioning, her HR had frequently been elevated as high as 150s at home.  I am concerned that she could have a tachy-mediated CMP from AT with improvement in EF after suppression.  She has stopped amiodarone. She rarely has palpitations and elevated HR at this point.   ?- She will stay off amiodarone for now.  ?- EP saw her, they thought that her high BMI makes ablation less likely to be successful.  Would consider in future if she can lose weight.  ?- Increase Toprol XL as above.  ?6. Obesity: She is working on weight loss through dietary changes and increased activity. Body mass index is 50.74 kg/m?Marland Kitchen  Weight down 6 lbs.  ?- She did not tolerate semaglutide.   ?7.  Snores: She has been referred for in-hospital sleep study (insurance will not cover home SS).  ? ?Followup 3 wks with HF pharmacist (increase Toprol XL), see APP in 3-4 months.  ? ?Loralie Champagne ?11/30/2021 ? ?

## 2021-12-03 ENCOUNTER — Other Ambulatory Visit: Payer: Self-pay | Admitting: Family Medicine

## 2021-12-03 DIAGNOSIS — F419 Anxiety disorder, unspecified: Secondary | ICD-10-CM

## 2021-12-03 IMAGING — US US ABDOMEN LIMITED
1 series · 14 of 25 positions shown · non-contrast
Comparison: CT from earlier in the same day.

CLINICAL DATA: Upper abdominal pain

EXAM:
ULTRASOUND ABDOMEN LIMITED RIGHT UPPER QUADRANT

[Series 1: us abdomen limited ruq (liver/gb) · 14 of 57 slices shown]
[im 1/57]
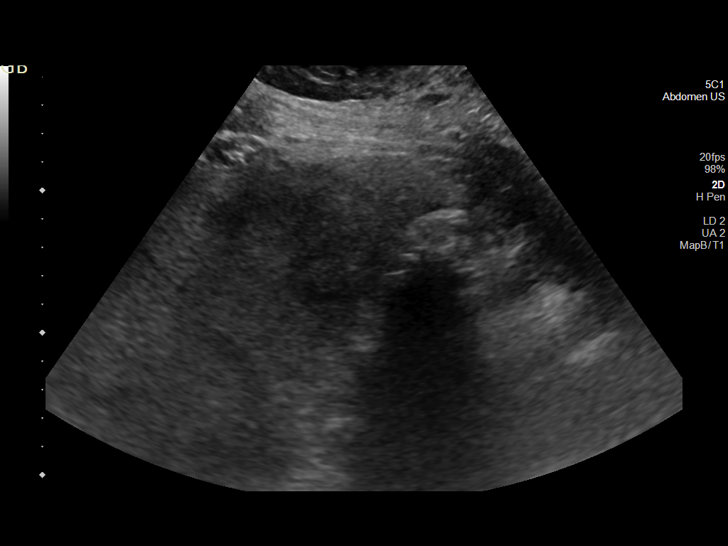
[im 5/57]
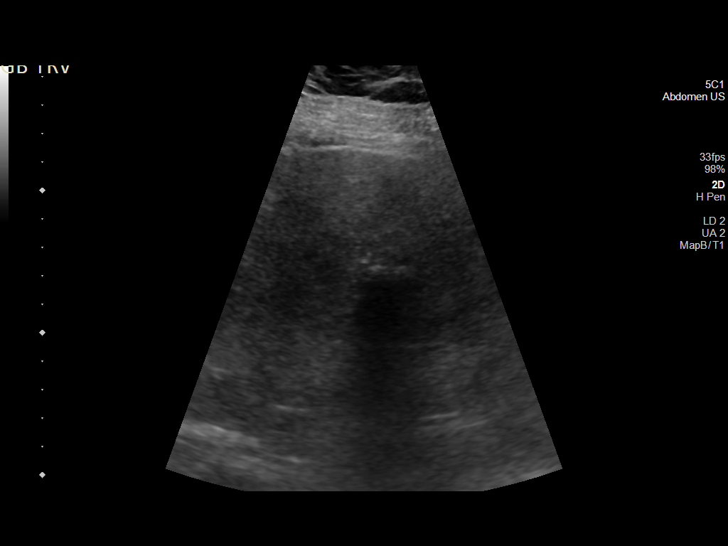
[im 10/57]
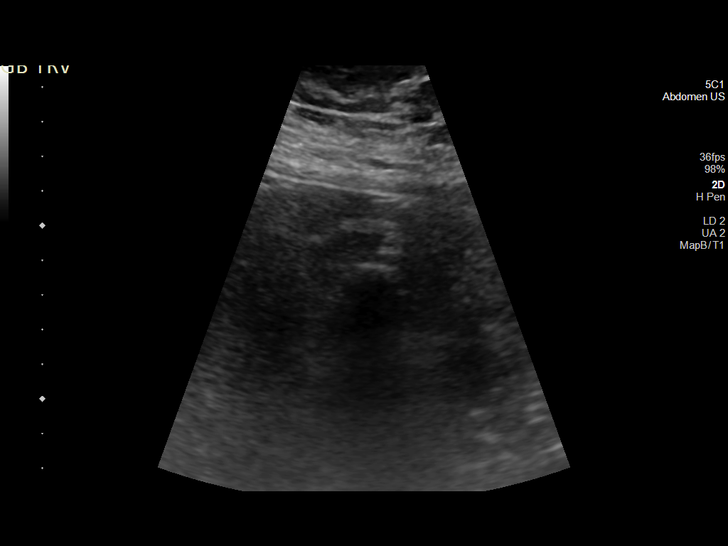
[im 15/57]
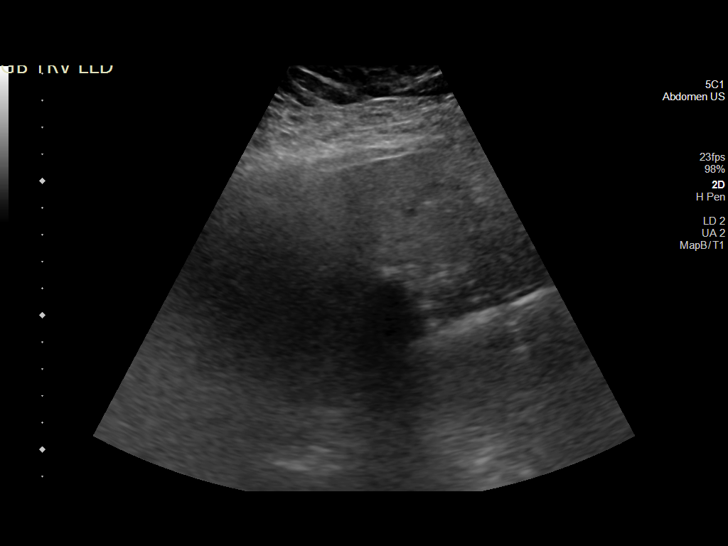
[im 19/57]
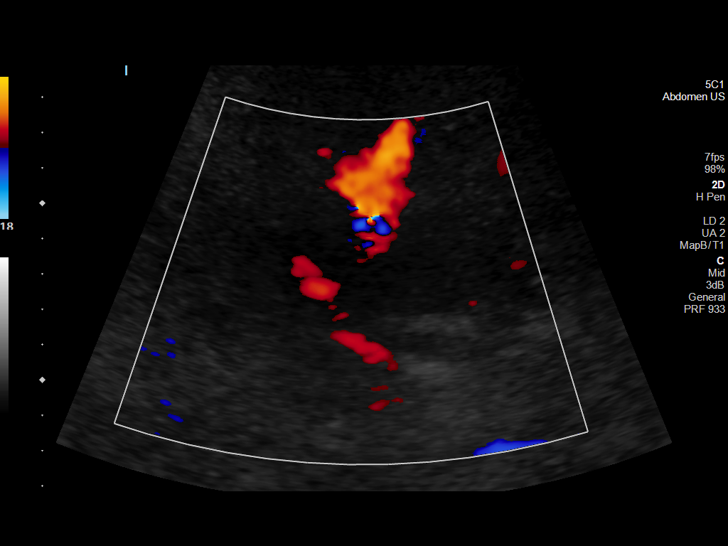
[im 22/57]
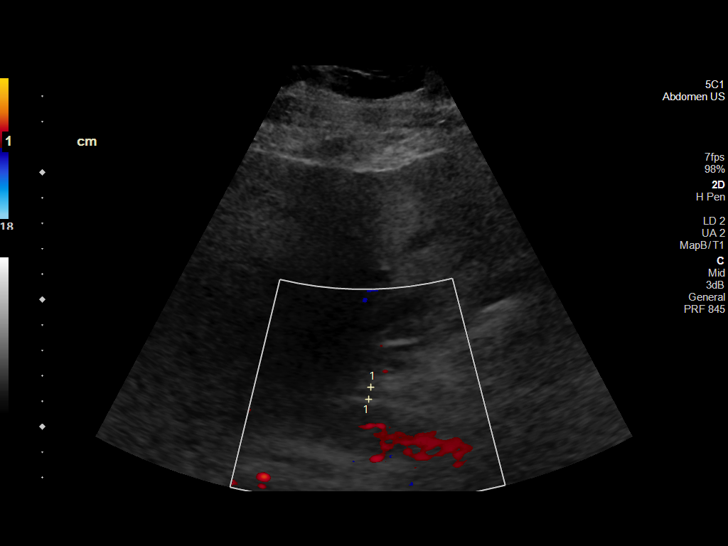
[im 26/57]
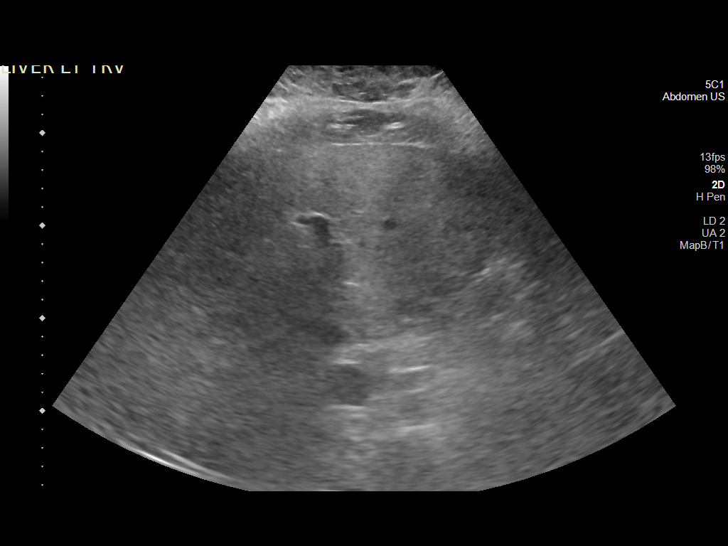
[im 31/57]
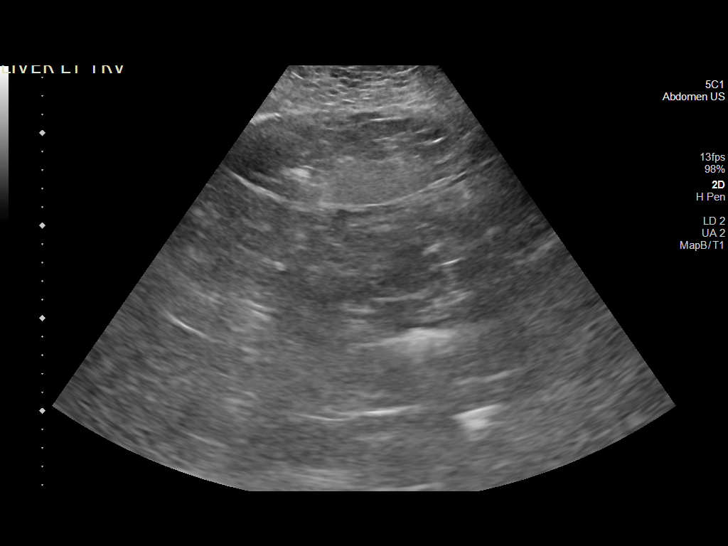
[im 36/57]
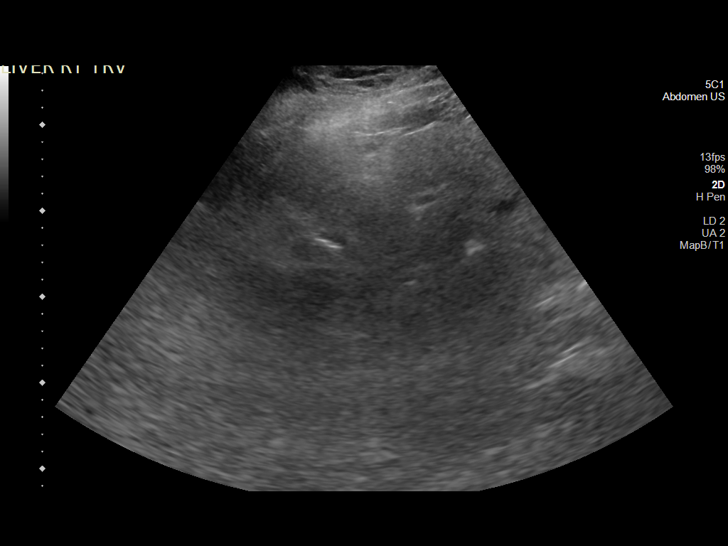
[im 38/57]
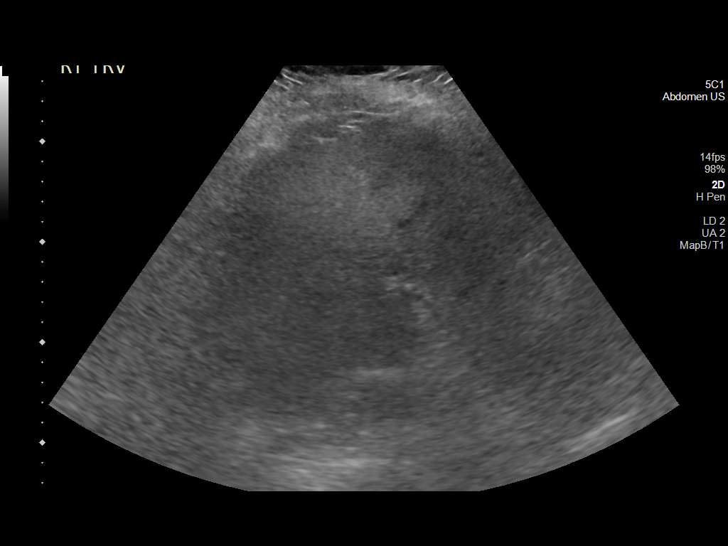
[im 43/57]
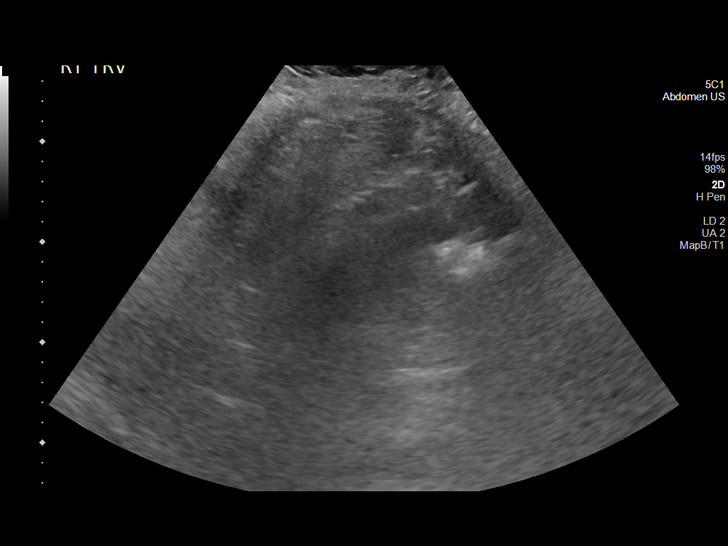
[im 47/57]
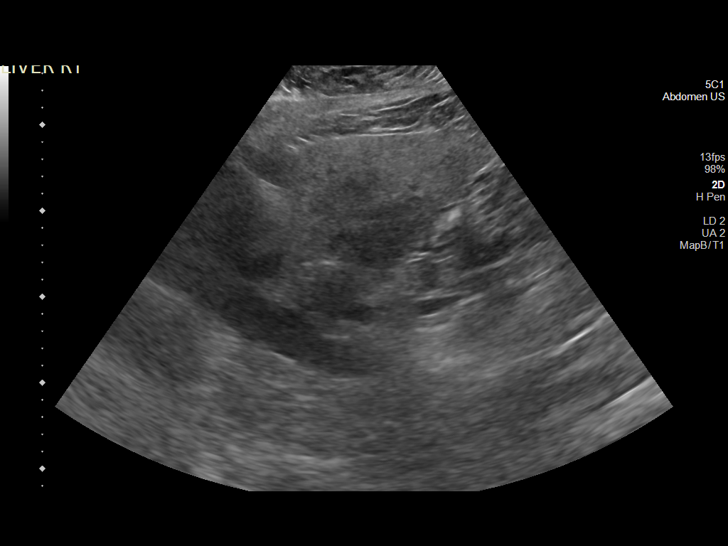
[im 52/57]
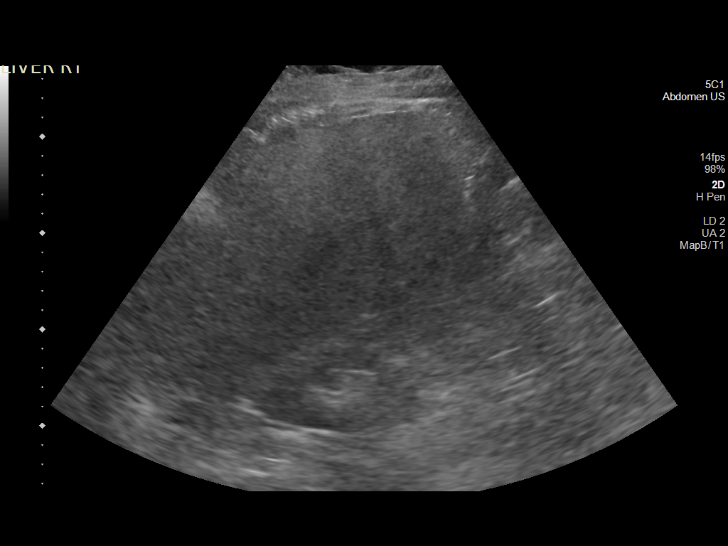
[im 57/57]
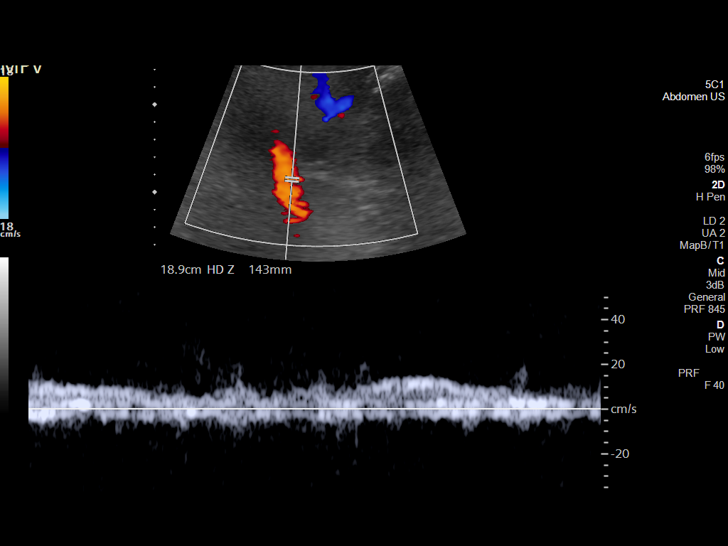

[14 of 25 positions shown; findings below may reference images not displayed]

FINDINGS: Gallbladder:

Gallbladder is decompressed with wall echo shadow sign consistent
with cholelithiasis. No pericholecystic fluid is noted. Negative
sonographic Murphy's sign is elicited.

Common bile duct:

Diameter: 7 mm.

Liver:

Increased in echogenicity consistent with fatty infiltration.
Hypoechoic lesion is noted in the left lobe measuring 1.4 cm. This
was not well appreciated on the prior CT examination. Portal vein is
patent on color Doppler imaging with normal direction of blood flow
towards the liver.

Other: None.
IMPRESSION: Changes consistent with a decompressed gallbladder full of
gallstones. No other complicating factors are noted.

Hypoechoic lesion within the left lobe of the liver measuring
cm. This may simply represent a complex cyst although incompletely
evaluated on this exam. Short-term follow-up ultrasound in 6 months
is recommended.

## 2021-12-06 ENCOUNTER — Telehealth (HOSPITAL_COMMUNITY): Payer: Self-pay | Admitting: *Deleted

## 2021-12-06 NOTE — Telephone Encounter (Signed)
Attempted to call patient again for Exercise Education per referral from Social Work in [facility]. Patient returned call and expressed her specific interest in exercising and doing this safely with her new diagnosis of CHF, especially with her inactivity in the last 5 years. We discussed the safety and benefits of exercise with this diagnosis and how to begin an exercise plan and progress as needed. She was provided several handouts via email for her reference and each was explained in detail for how they would apply to her specifically. She was encouraged to start with 10-minute bouts of exercise, 2x daily until she could build up to a goal of a total of 30-45 minutes a day. We discussed how she will progress each week or two to achieve that. She demonstrated clear understanding and was very grateful for the information and the guidance and I assured her she was able to call back with contact information provided, for further guidance or questions at any time during her journey.  ? ?Documents provided: ?- Heart Failure Exercise Handout ?- Seated Strengthening Exercises ?- Strengthening Exercises You Can Do At Home ?- ACSM Adult Exercise Guideline Recommendation ? ? ? ?Vickie Breach, MS, ACSM, [facility] ?Clinical Exercise Physiologist/ Health and Wellness Coach  ? ?

## 2021-12-10 ENCOUNTER — Other Ambulatory Visit: Payer: Self-pay | Admitting: Family Medicine

## 2021-12-10 DIAGNOSIS — E119 Type 2 diabetes mellitus without complications: Secondary | ICD-10-CM

## 2021-12-13 ENCOUNTER — Telehealth: Payer: Medicaid Other | Admitting: Physician Assistant

## 2021-12-13 ENCOUNTER — Telehealth: Payer: Medicaid Other

## 2021-12-13 DIAGNOSIS — B9689 Other specified bacterial agents as the cause of diseases classified elsewhere: Secondary | ICD-10-CM | POA: Diagnosis not present

## 2021-12-13 DIAGNOSIS — J019 Acute sinusitis, unspecified: Secondary | ICD-10-CM | POA: Diagnosis not present

## 2021-12-13 MED ORDER — AMOXICILLIN-POT CLAVULANATE 875-125 MG PO TABS: 1.0000 | ORAL_TABLET | Freq: Two times a day (BID) | ORAL | 0 refills | Status: DC

## 2021-12-13 NOTE — Progress Notes (Signed)
?Virtual Visit Consent  ? ?Vickie Graham, you are scheduled for a virtual visit with a [facility] provider today.   ?  ?Just as with appointments in the office, your consent must be obtained to participate.  Your consent will be active for this visit and any virtual visit you may have with one of our providers in the next 365 days.   ?  ?If you have a MyChart account, a copy of this consent can be sent to you electronically.  All virtual visits are billed to your insurance company just like a traditional visit in the office.   ? ?As this is a virtual visit, video technology does not allow for your provider to perform a traditional examination.  This may limit your provider's ability to fully assess your condition.  If your provider identifies any concerns that need to be evaluated in person or the need to arrange testing (such as labs, EKG, etc.), we will make arrangements to do so.   ?  ?Although advances in technology are sophisticated, we cannot ensure that it will always work on either your end or our end.  If the connection with a video visit is poor, the visit may have to be switched to a telephone visit.  With either a video or telephone visit, we are not always able to ensure that we have a secure connection.    ? ?I need to obtain your verbal consent now.   Are you willing to proceed with your visit today?  ?  ?GLENDON FISER has provided verbal consent on 12/13/2021 for a virtual visit (video or telephone). ?  ?Leeanne Rio, PA-C  ? ?Date: 12/13/2021 1:29 PM ? ? ?Virtual Visit via Video Note  ? ?ILeeanne Rio, connected with  Vickie Graham  ([id number], 1975/10/10) on 12/13/21 at  1:00 PM EDT by a video-enabled telemedicine application and verified that I am speaking with the correct person using two identifiers. ? ?Location: ?Patient: Virtual Visit Location Patient: Home ?Provider: Virtual Visit Location Provider: Home Office ?  ?I discussed the limitations of evaluation and management by  telemedicine and the availability of in person appointments. The patient expressed understanding and agreed to proceed.   ? ?History of Present Illness: ?Vickie Graham is a 46 y.o. who identifies as a female who was assigned female at birth, and is being seen today for possible sinusitis. Notes URI symptoms starting beginning of last week with more so nasal congestion and rhinorrhea with some sinus pressure, worsening over weekend. Now with sinus pain, thicker drainage and fatigue. Notes cough but denies chest pain or SOB.  ? ? ?HPI: HPI  ?Problems:  ?Patient Active Problem List  ? Diagnosis Date Noted  ? Anxiety 04/11/2021  ? Hyperlipidemia associated with type 2 diabetes mellitus ([place]) 04/11/2021  ? HFrEF (heart failure with reduced ejection fraction) ([place]) 03/03/2021  ? Type 2 diabetes mellitus without complications ([place]) 25/01/3975  ? Atrial tachycardia ([place])   ? Acute systolic CHF (congestive heart failure) ([place])   ? Hypothyroidism   ? Morbid obesity ([place]) 12/13/2013  ?  ?Allergies:  ?Allergies  ?Allergen Reactions  ? Sulfa Antibiotics   ? ?Medications:  ?Current Outpatient Medications:  ?  amoxicillin-clavulanate (AUGMENTIN) 875-125 MG tablet, Take 1 tablet by mouth 2 (two) times daily., Disp: 14 tablet, Rfl: 0 ?  acetaminophen (TYLENOL) 325 MG tablet, Take 2 tablets (650 mg total) by mouth every 6 (six) hours as needed for mild pain., Disp: ,  Rfl:  ?  FLUoxetine (PROZAC) 40 MG capsule, Take 1 capsule by mouth once daily, Disp: 90 capsule, Rfl: 1 ?  furosemide (LASIX) 40 MG tablet, Take 1 tablet (40 mg total) by mouth daily. As needed for weight gain of 3 lbs in 24 hour or 5 lbs in a week, Disp: 30 tablet, Rfl: 6 ?  JARDIANCE 10 MG TABS tablet, Take 1 tablet by mouth once daily, Disp: 60 tablet, Rfl: 5 ?  metFORMIN (GLUCOPHAGE-XR) 500 MG 24 hr tablet, Take 1 tablet by mouth twice daily, Disp: 60 tablet, Rfl: 0 ?  metoprolol succinate (TOPROL-XL) 100 MG 24 hr tablet, Take 1 tablet (100 mg total) by mouth  daily., Disp: 30 tablet, Rfl: 11 ?  potassium chloride SA (KLOR-CON) 20 MEQ tablet, Take 1 tablet (20 mEq total) by mouth daily. As needed when lasix dose is taken, Disp: 30 tablet, Rfl: 3 ?  sacubitril-valsartan (ENTRESTO) 97-103 MG, Take 1 tablet by mouth 2 (two) times daily., Disp: 60 tablet, Rfl: 11 ?  spironolactone (ALDACTONE) 25 MG tablet, Take 1 tablet by mouth once daily, Disp: 30 tablet, Rfl: 0 ? ?Observations/Objective: ?Patient is well-developed, well-nourished in no acute distress.  ?Resting comfortably at home.  ?Head is normocephalic, atraumatic.  ?No labored breathing. ?Speech is clear and coherent with logical content.  ?Patient is alert and oriented at baseline.  ? ?Assessment and Plan: ?1. Acute bacterial sinusitis ?- amoxicillin-clavulanate (AUGMENTIN) 875-125 MG tablet; Take 1 tablet by mouth 2 (two) times daily.  Dispense: 14 tablet; Refill: 0 ? ?Rx Augmentin.  Increase fluids.  Rest.  Saline nasal spray.  Probiotic.  Mucinex as directed.  Humidifier in bedroom. Coricidin HBP OTC.  Call or return to clinic if symptoms are not improving. ? ? ?Follow Up Instructions: ?I discussed the assessment and treatment plan with the patient. The patient was provided an opportunity to ask questions and all were answered. The patient agreed with the plan and demonstrated an understanding of the instructions.  A copy of instructions were sent to the patient via MyChart unless otherwise noted below.  ? ?The patient was advised to call back or seek an in-person evaluation if the symptoms worsen or if the condition fails to improve as anticipated. ? ?Time:  ?I spent 10 minutes with the patient via telehealth technology discussing the above problems/concerns.   ? ?Leeanne Rio, PA-C ?

## 2021-12-13 NOTE — Patient Instructions (Signed)
?Vickie Graham, thank you for joining Leeanne Rio, PA-C for today's virtual visit.  While this provider is not your primary care provider (PCP), if your PCP is located in our provider database this encounter information will be shared with them immediately following your visit. ? ?Consent: ?(Patient) Vickie Graham provided verbal consent for this virtual visit at the beginning of the encounter. ? ?Current Medications: ? ?Current Outpatient Medications:  ?  amoxicillin-clavulanate (AUGMENTIN) 875-125 MG tablet, Take 1 tablet by mouth 2 (two) times daily., Disp: 14 tablet, Rfl: 0 ?  acetaminophen (TYLENOL) 325 MG tablet, Take 2 tablets (650 mg total) by mouth every 6 (six) hours as needed for mild pain., Disp: , Rfl:  ?  FLUoxetine (PROZAC) 40 MG capsule, Take 1 capsule by mouth once daily, Disp: 90 capsule, Rfl: 1 ?  furosemide (LASIX) 40 MG tablet, Take 1 tablet (40 mg total) by mouth daily. As needed for weight gain of 3 lbs in 24 hour or 5 lbs in a week, Disp: 30 tablet, Rfl: 6 ?  JARDIANCE 10 MG TABS tablet, Take 1 tablet by mouth once daily, Disp: 60 tablet, Rfl: 5 ?  metFORMIN (GLUCOPHAGE-XR) 500 MG 24 hr tablet, Take 1 tablet by mouth twice daily, Disp: 60 tablet, Rfl: 0 ?  metoprolol succinate (TOPROL-XL) 100 MG 24 hr tablet, Take 1 tablet (100 mg total) by mouth daily., Disp: 30 tablet, Rfl: 11 ?  potassium chloride SA (KLOR-CON) 20 MEQ tablet, Take 1 tablet (20 mEq total) by mouth daily. As needed when lasix dose is taken, Disp: 30 tablet, Rfl: 3 ?  sacubitril-valsartan (ENTRESTO) 97-103 MG, Take 1 tablet by mouth 2 (two) times daily., Disp: 60 tablet, Rfl: 11 ?  spironolactone (ALDACTONE) 25 MG tablet, Take 1 tablet by mouth once daily, Disp: 30 tablet, Rfl: 0  ? ?Medications ordered in this encounter:  ?Meds ordered this encounter  ?Medications  ? amoxicillin-clavulanate (AUGMENTIN) 875-125 MG tablet  ?  Sig: Take 1 tablet by mouth 2 (two) times daily.  ?  Dispense:  14 tablet  ?  Refill:  0  ?   Order Specific Question:   Supervising Provider  ?  Answer:   Noemi Chapel [[id number]]  ?  ? ?*If you need refills on other medications prior to your next appointment, please contact your pharmacy* ? ?Follow-Up: ?Call back or seek an in-person evaluation if the symptoms worsen or if the condition fails to improve as anticipated. ? ?Other Instructions ?Please take antibiotic as directed.  Increase fluid intake.  Use Saline nasal spray.  Take a daily multivitamin. Coricidin HBP OTC.  Place a humidifier in the bedroom.  Please call or return clinic if symptoms are not improving. ? ?Sinusitis ?Sinusitis is redness, soreness, and swelling (inflammation) of the paranasal sinuses. Paranasal sinuses are air pockets within the bones of your face (beneath the eyes, the middle of the forehead, or above the eyes). In healthy paranasal sinuses, mucus is able to drain out, and air is able to circulate through them by way of your nose. However, when your paranasal sinuses are inflamed, mucus and air can become trapped. This can allow bacteria and other germs to grow and cause infection. ?Sinusitis can develop quickly and last only a short time (acute) or continue over a long period (chronic). Sinusitis that lasts for more than 12 weeks is considered chronic.  ?CAUSES  ?Causes of sinusitis include: ?Allergies. ?Structural abnormalities, such as displacement of the cartilage that separates your nostrils (deviated septum),  which can decrease the air flow through your nose and sinuses and affect sinus drainage. ?Functional abnormalities, such as when the small hairs (cilia) that line your sinuses and help remove mucus do not work properly or are not present. ?SYMPTOMS  ?Symptoms of acute and chronic sinusitis are the same. The primary symptoms are pain and pressure around the affected sinuses. Other symptoms include: ?Upper toothache. ?Earache. ?Headache. ?Bad breath. ?Decreased sense of smell and taste. ?A cough, which worsens when you  are lying flat. ?Fatigue. ?Fever. ?Thick drainage from your nose, which often is green and may contain pus (purulent). ?Swelling and warmth over the affected sinuses. ?DIAGNOSIS  ?Your caregiver will perform a physical exam. During the exam, your caregiver may: ?Look in your nose for signs of abnormal growths in your nostrils (nasal polyps). ?Tap over the affected sinus to check for signs of infection. ?View the inside of your sinuses (endoscopy) with a special imaging device with a light attached (endoscope), which is inserted into your sinuses. ?If your caregiver suspects that you have chronic sinusitis, one or more of the following tests may be recommended: ?Allergy tests. ?Nasal culture A sample of mucus is taken from your nose and sent to a lab and screened for bacteria. ?Nasal cytology A sample of mucus is taken from your nose and examined by your caregiver to determine if your sinusitis is related to an allergy. ?TREATMENT  ?Most cases of acute sinusitis are related to a viral infection and will resolve on their own within 10 days. Sometimes medicines are prescribed to help relieve symptoms (pain medicine, decongestants, nasal steroid sprays, or saline sprays).  ?However, for sinusitis related to a bacterial infection, your caregiver will prescribe antibiotic medicines. These are medicines that will help kill the bacteria causing the infection.  ?Rarely, sinusitis is caused by a fungal infection. In theses cases, your caregiver will prescribe antifungal medicine. ?For some cases of chronic sinusitis, surgery is needed. Generally, these are cases in which sinusitis recurs more than 3 times per year, despite other treatments. ?HOME CARE INSTRUCTIONS  ?Drink plenty of water. Water helps thin the mucus so your sinuses can drain more easily. ?Use a humidifier. ?Inhale steam 3 to 4 times a day (for example, sit in the bathroom with the shower running). ?Apply a warm, moist washcloth to your face 3 to 4 times a day,  or as directed by your caregiver. ?Use saline nasal sprays to help moisten and clean your sinuses. ?Take over-the-counter or prescription medicines for pain, discomfort, or fever only as directed by your caregiver. ?SEEK IMMEDIATE MEDICAL CARE IF: ?You have increasing pain or severe headaches. ?You have nausea, vomiting, or drowsiness. ?You have swelling around your face. ?You have vision problems. ?You have a stiff neck. ?You have difficulty breathing. ?MAKE SURE YOU:  ?Understand these instructions. ?Will watch your condition. ?Will get help right away if you are not doing well or get worse. ?Document Released: 09/03/2005 Document Revised: 11/26/2011 Document Reviewed: 09/18/2011 ?ExitCare? Patient Information ?2014 [place], [place]. ? ? ? ?If you have been instructed to have an in-person evaluation today at a local Urgent Care facility, please use the link below. It will take you to a list of all of our available [facility], including address, phone number and hours of operation. Please do not delay care.  ?[facility] ? ?If you or a family member do not have a primary care provider, use the link below to schedule a visit and establish care.  When you choose a [facility] primary care physician or advanced practice provider, you gain a long-term partner in health. ?Find a Primary Care Provider ? ?Learn more about [facility]'s in-office and virtual care options: ?[facility]  ?

## 2021-12-20 ENCOUNTER — Telehealth: Payer: Self-pay | Admitting: Licensed Clinical Social Worker

## 2021-12-20 ENCOUNTER — Telehealth (HOSPITAL_COMMUNITY): Payer: Self-pay | Admitting: *Deleted

## 2021-12-20 NOTE — Telephone Encounter (Signed)
She can take Lasix 40 mg daily x 3 days with KCl 20 mEq daily x 3 days.  Followup with APP.  ?

## 2021-12-20 NOTE — Telephone Encounter (Signed)
Reminder regarding Medicaid re-certification process was mailed to pt, encouraging her to ensure that an up to date phone number, address and email is registered with DSS so that she receives re-certification paperwork.  ?  ?Westley Hummer, MSW, LCSW ?Clinical Social Worker II ?[facility]  ?[phone]- work cell phone (preferred) ?[phone]- desk phone ?

## 2021-12-20 NOTE — Telephone Encounter (Signed)
Pt called stating she doesn't feel well, c/o abdominal swelling and fatigue. Pt said  her weight is only up 1lb.  ? ?Routed to [facility]  ?

## 2021-12-22 NOTE — Telephone Encounter (Signed)
Pt aware and has scheduled visit with PharmD on 4/11.  ?

## 2021-12-24 ENCOUNTER — Other Ambulatory Visit (HOSPITAL_COMMUNITY): Payer: Self-pay | Admitting: Cardiology

## 2021-12-25 NOTE — Progress Notes (Incomplete)
***In Progress*** ? ?  ?Advanced Heart Failure Clinic Note  ?Primary Care: Dr. Alcus Dad ?Primary HF Cardiologist: Dr. Aundra Dubin ? ?HPI:  ?46 y.o. female w/ h/o systolic HF/nonischemic CM, obesity, ADHD, anxiety, depression, hypothyroidism, GERD, PACs, DM2. ?  ?Presented to ED on 02/13/21 w/ complaints of 3 week h/o progressive DOE, LEE and abdominal fullness. Found to be in acute CHF. BNP 1388. CXR w/ mild congestive CHF w/ interstitial edema. EKG showed ST 120 bpm. HS trop 48>>50.  TSH 13.416. Free T4 WNL. Chest CT negative for PE. She was admitted for CHF and started on IV Lasix. Echo showed biventricular dysfunction. LVEF severely reduced 20-25% w/ global HK. Mild LVH. RV moderately reduced w/ estimated RVSP 36 mmHg. Also found to have new diagnosis of T2D, Hgb A1c 11. She responded well to diuresis and was transitioned to oral Lasix and discharged on GDMT. ?  ?Seen in ED on 06/13/21 with fatigue. Fluoxetine and metformin recently increased at PCP visit 06/02/21. Chest x-ray with evidence of mild interstitial edema. It was recommended to increase lasix to 80 mg daily for 3 days. Declined COVID test as had negative result at home. No labs or ECG done.   ?  ?Echo in 06/2021 showed EF 40-45%, mild LV enlargement, normal RV, normal IVC.  ?  ?Returned for followup of CHF 11/30/21.  She rarely takes Lasix.  She stopped Crestor due to fatigue and feels better off it. She tried semaglutide for weight loss but was unable to tolerate it. She has occasional episodes of palpitations and tachycardia when under stress, but this tends to be rare.  No significant exertional dyspnea.  No lightheadedness.  Not getting much exercise.  ? ?Today he returns to HF clinic for pharmacist medication titration. At last visit with MD metoprolol succinate was increased to 100 mg daily.  ? ?Shortness of breath/dyspnea on exertion? {YES NO:22349}  ?Orthopnea/PND? {YES NO:22349} ?Edema? {YES NO:22349} ?Lightheadedness/dizziness? {YES  NO:22349} ?Daily weights at home? {YES NO:22349} ?Blood pressure/heart rate monitoring at home? {YES NO:22349} ?Following low-sodium/fluid-restricted diet? {YES NO:22349} ? ?HF Medications: ?Metoprolol Succinate 100 mg daily ?Entresto 97/103 mg BID  ?Spironolactone 25 mg daily ?Jardiance 10 mg daily ?Furosemide 40 mg daily prn ?Potassium Chloride 20 mEq daily prn w/Furosemide  ? ?Has the patient been experiencing any side effects to the medications prescribed?  {YES NO:22349} ? ?Does the patient have any problems obtaining medications due to transportation or finances?   {YES NO:22349} ? ?Understanding of regimen: {excellent/good/fair/poor:19665} ?Understanding of indications: {excellent/good/fair/poor:19665} ?Potential of compliance: {excellent/good/fair/poor:19665} ?Patient understands to avoid NSAIDs. ?Patient understands to avoid decongestants. ?  ? ?Pertinent Lab Values: ?11/30/21: Serum creatinine 0.89, BUN 20, Potassium 4.0, Sodium 138 ?Vital Signs: ?Weight: *** (last clinic weight: 259 lbs) ?Blood pressure: ***  ?Heart rate: ***  ? ?Assessment/Plan: ?1.  Chronic systolic CHF:  New onset cardiomyopathy 01/2021, nonischemic cardiomyopathy. Echo in 01/2021 with EF 20-25%, moderately decreased RV systolic function.  LHC/RHC in 02/2021 with preserved cardiac output, no significant coronary disease (nonischemic cardiomyopathy). Cardiac MRI with LV EF 21%, RV EF 30%, no myocardial LGE.  No ETOH, no drugs, no FH of CAD or CMP. Consider prior viral myocarditis, effects of diabetes itself, or newly-diagnosed hypothyroidism.  However, patient also had frequent episodes of rapid atrial tachycardia in the hospital and said that her HR had often been up to 150 bpm at home on her Apple Watch. Concern this may be tachycardia-mediated CMP.  Echo in 07/2021 showed EF up to 40-45%, normal RV.  NYHA class II symptoms, not volume overloaded.  ?- Continue Lasix 40 mg prn.  ?- Continue Entresto 97/103 mg bid.   ?- Continue  sprionolactone 25 mg daily.  ?- Increase Toprol XL to 100 mg daily.  ?- Continue Jardiance 10 mg daily. ?- EF is out of ICD range on last echo.  ? ?2. Type 2 diabetes: Per PCP.  ? ?3. Hypothyroidism: Subclinical, mild TSH elevation.  ? ?4. Hyperlipidemia: Unable to tolerate statin.  ?- I will check lipids today off statin.  ?5. Atrial tachycardia: Frequent episodes noted during 5/22-6/22 admission, recurred when amiodarone stopped.  On questioning, her HR had frequently been elevated as high as 150s at home.  I am concerned that she could have a tachy-mediated CMP from AT with improvement in EF after suppression.  She has stopped amiodarone. She rarely has palpitations and elevated HR at this point.   ?- She will stay off amiodarone for now.  ?- EP saw her, they thought that her high BMI makes ablation less likely to be successful.  Would consider in future if she can lose weight.  ?- Increase Toprol XL as above.  ?6. Obesity: She is working on weight loss through dietary changes and increased activity. Body mass index is 50.74 kg/m?Marland Kitchen  Weight down 6 lbs.  ?- She did not tolerate semaglutide.   ?7. Snores: She has been referred for in-hospital sleep study (insurance will not cover home SS).  ? ?Follow up *** ? ? ?Audry Riles, PharmD, BCPS, BCCP, CPP ?Heart Failure Clinic Pharmacist ?[phone] ? ? ?

## 2021-12-26 ENCOUNTER — Ambulatory Visit (HOSPITAL_COMMUNITY)
Admission: RE | Admit: 2021-12-26 | Discharge: 2021-12-26 | Disposition: A | Discharge status: Home / Self Care | Payer: Medicaid Other | Source: Ambulatory Visit | Attending: Internal Medicine | Admitting: Internal Medicine

## 2021-12-26 VITALS — BP 108/68 | HR 66 | Wt 263.8 lb

## 2021-12-26 DIAGNOSIS — I428 Other cardiomyopathies: Secondary | ICD-10-CM | POA: Diagnosis not present

## 2021-12-26 DIAGNOSIS — I471 Supraventricular tachycardia: Secondary | ICD-10-CM | POA: Insufficient documentation

## 2021-12-26 DIAGNOSIS — I5022 Chronic systolic (congestive) heart failure: Secondary | ICD-10-CM | POA: Insufficient documentation

## 2021-12-26 DIAGNOSIS — E785 Hyperlipidemia, unspecified: Secondary | ICD-10-CM | POA: Insufficient documentation

## 2021-12-26 DIAGNOSIS — Z79899 Other long term (current) drug therapy: Secondary | ICD-10-CM | POA: Insufficient documentation

## 2021-12-26 DIAGNOSIS — E119 Type 2 diabetes mellitus without complications: Secondary | ICD-10-CM | POA: Diagnosis not present

## 2021-12-26 DIAGNOSIS — E039 Hypothyroidism, unspecified: Secondary | ICD-10-CM | POA: Insufficient documentation

## 2021-12-26 DIAGNOSIS — Z7984 Long term (current) use of oral hypoglycemic drugs: Secondary | ICD-10-CM | POA: Diagnosis not present

## 2021-12-26 DIAGNOSIS — R0683 Snoring: Secondary | ICD-10-CM | POA: Insufficient documentation

## 2021-12-26 DIAGNOSIS — E669 Obesity, unspecified: Secondary | ICD-10-CM | POA: Insufficient documentation

## 2021-12-26 MED ORDER — METOPROLOL SUCCINATE ER 50 MG PO TB24: 50.0000 mg | ORAL_TABLET | Freq: Every day | ORAL | 3 refills | Status: DC

## 2021-12-26 NOTE — Progress Notes (Signed)
?  ?Advanced Heart Failure Clinic Note  ?Primary Care: Dr. Alcus Dad ?Primary HF Cardiologist: Dr. Aundra Dubin ? ?HPI:  ?46 y.o. female w/ h/o systolic HF/nonischemic CM, obesity, ADHD, anxiety, depression, hypothyroidism, GERD, PACs, DM2. ?  ?Presented to ED on 02/13/21 w/ complaints of 3 week h/o progressive DOE, LEE and abdominal fullness. Found to be in acute CHF. BNP 1388. CXR w/ mild congestive CHF w/ interstitial edema. EKG showed ST 120 bpm. HS trop 48>>50.  TSH 13.416. Free T4 WNL. Chest CT negative for PE. She was admitted for CHF and started on IV Lasix. Echo showed biventricular dysfunction. LVEF severely reduced 20-25% w/ global HK. Mild LVH. RV moderately reduced w/ estimated RVSP 36 mmHg. Also found to have new diagnosis of T2DM, Hgb A1c 11. She responded well to diuresis and was transitioned to oral Lasix and discharged on GDMT. ?  ?Seen in ED on 06/13/21 with fatigue. Fluoxetine and metformin recently increased at PCP visit 06/02/21. Chest x-ray with evidence of mild interstitial edema. It was recommended to increase Lasix to 80 mg daily for 3 days. Declined COVID test as had negative result at home. No labs or ECG done.   ?  ?Echo in 06/2021 showed EF 40-45%, mild LV enlargement, normal RV, normal IVC.  ?  ?Returned to [facility] for followup of CHF 11/30/21.  She reported that she rarely takes Lasix.  She stopped Crestor due to fatigue and noted that she feels better off it. She tried semaglutide for weight loss but was unable to tolerate it. She had occasional episodes of palpitations and tachycardia when under stress, but this tends to be rare.  No significant exertional dyspnea.  No lightheadedness.  Not getting much exercise.  ? ?Today she returns to HF clinic for pharmacist medication titration. At last visit with MD, metoprolol succinate was increased to 100 mg daily. Unfortunately she did not tolerate this increase due to dizziness and fatigue. She self-decreased her metoprolol succinate  back to 50 mg daily. Overall reports feeling good. Denies dizziness or lightheadedness since decreasing the metoprolol. 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 Endorses occasional palpitations when stressed. Notes her breathing is fine, denies SOB and endorses being able to complete ADLs. Weight at home has been stable at 256 lbs. She uses PRN Lasix about once a month. Denies LEE/PND/Orthopnea.  ? ?HF Medications: ?Metoprolol succinate 50 mg daily ?Entresto 97/103 mg BID  ?Spironolactone 25 mg daily ?Jardiance 10 mg daily ?Lasix 40 mg daily PRN ?Potassium Chloride 20 mEq daily PRN w/ Lasix  ? ?Has the patient been experiencing any side effects to the medications prescribed?  Yes, dizziness/fatigue with metoprolol succinate 100 mg daily. Has resolved on the lower dose of 50 mg daily.  ? ?Does the patient have any problems obtaining medications due to transportation or finances?   No - has BCBS Medicaid ? ?Understanding of regimen: good ?Understanding of indications: good ?Potential of compliance: good ?Patient understands to avoid NSAIDs. ?Patient understands to avoid decongestants. ?  ? ?Pertinent Lab Values: ?11/30/21: Serum creatinine 0.89, BUN 20, Potassium 4.0, Sodium 138 ? ?Vital Signs: ?Weight: 263.8 lbs (last clinic weight: 259 lbs) ?Blood pressure: 108/68  ?Heart rate: 66  ? ?Assessment/Plan: ?1.  Chronic systolic CHF:  New onset cardiomyopathy 01/2021, nonischemic cardiomyopathy. Echo in 01/2021 with EF 20-25%, moderately decreased RV systolic function.  LHC/RHC in 02/2021 with preserved cardiac output, no significant coronary disease (nonischemic cardiomyopathy). Cardiac MRI with LV EF 21%, RV EF 30%, no myocardial LGE.  No ETOH, no drugs, no FH of CAD or CMP. Consider prior viral myocarditis, effects of diabetes itself, or newly-diagnosed hypothyroidism.  However, patient also had frequent episodes of rapid atrial tachycardia in the hospital and said that her HR had often been up to 150 bpm at home on  her Apple Watch. Concern this may be tachycardia-mediated CMP.  Echo in 07/2021 showed EF up to 40-45%, normal RV.   ?- NYHA class II symptoms, euvolemic on exam.  ?- Continue furosemide 40 mg PRN.  ?- Continue metoprolol succinate 50 mg daily. Did not tolerate metoprolol succinate 100 mg daily due to dizziness and fatigue.  ?- Continue Entresto 97/103 mg BID.   ?- Continue spironolactone 25 mg daily.  ?- Continue Jardiance 10 mg daily. ?- EF is out of ICD range on last echo.  ? ?2. Type 2 diabetes: Per PCP.  ? ?3. Hypothyroidism: Subclinical, mild TSH elevation.  ? ?4. Hyperlipidemia: Unable to tolerate statin.  ? ?5. Atrial tachycardia: Frequent episodes noted during 01/2021-02/2021 admission, recurred when amiodarone stopped.  On questioning, her HR had frequently been elevated as high as 150s at home.  Concerned she could have a tachy-mediated CMP from AT with improvement in EF after suppression.  She has stopped amiodarone. She rarely has palpitations and elevated HR at this point.   ?- She will stay off amiodarone for now.  ?- EP saw her, they thought that her high BMI makes ablation less likely to be successful.  Would consider in future if she can lose weight.  ?- Continue metoprolol succinate 50 mg daily ? ?6. Obesity: She is working on weight loss through dietary changes and increased activity. ?- She did not tolerate semaglutide.   ? ?7. Snores: She has been referred for in-hospital sleep study (insurance will not cover home SS).  ? ?Follow up 03/02/22 with APP clinic.  ? ? ?Audry Riles, PharmD, BCPS, BCCP, CPP ?Heart Failure Clinic Pharmacist ?[phone] ? ?

## 2021-12-26 NOTE — Patient Instructions (Signed)
It was a pleasure seeing you today! ? ?MEDICATIONS: ?-No medication changes today ?-Call if you have questions about your medications. ? ? ?NEXT APPOINTMENT: ?Return to clinic in 2 months with APP Clinic. ? ?In general, to take care of your heart failure: ?-Limit your fluid intake to 2 Liters (half-gallon) per day.   ?-Limit your salt intake to ideally 2-3 grams (2000-3000 mg) per day. ?-Weigh yourself daily and record, and bring that "weight diary" to your next appointment.  (Weight gain of 2-3 pounds in 1 day typically means fluid weight.) ?-The medications for your heart are to help your heart and help you live longer.   ?-Please contact us before stopping any of your heart medications. ? ?Call the clinic at [phone] with questions or to reschedule future appointments.  ?

## 2022-01-04 ENCOUNTER — Encounter (HOSPITAL_BASED_OUTPATIENT_CLINIC_OR_DEPARTMENT_OTHER): Payer: Medicaid Other | Admitting: Cardiology

## 2022-01-07 ENCOUNTER — Encounter (HOSPITAL_COMMUNITY): Payer: Self-pay

## 2022-01-07 ENCOUNTER — Other Ambulatory Visit: Payer: Self-pay | Admitting: Family Medicine

## 2022-01-07 DIAGNOSIS — E119 Type 2 diabetes mellitus without complications: Secondary | ICD-10-CM

## 2022-01-11 ENCOUNTER — Ambulatory Visit: Payer: Medicaid Other | Admitting: Pharmacist

## 2022-01-11 VITALS — BP 118/76 | HR 73 | Resp 15 | Ht 60.0 in | Wt 266.0 lb

## 2022-01-11 DIAGNOSIS — E119 Type 2 diabetes mellitus without complications: Secondary | ICD-10-CM | POA: Diagnosis not present

## 2022-01-11 DIAGNOSIS — I5020 Unspecified systolic (congestive) heart failure: Secondary | ICD-10-CM | POA: Diagnosis not present

## 2022-01-11 LAB — HEMOGLOBIN A1C
Est. average glucose Bld gHb Est-mCnc: 128 mg/dL
Hgb A1c MFr Bld: 6.1 % — ABNORMAL HIGH (ref 4.8–5.6)

## 2022-01-11 MED ORDER — OZEMPIC (0.25 OR 0.5 MG/DOSE) 2 MG/1.5ML ~~LOC~~ SOPN: PEN_INJECTOR | SUBCUTANEOUS | 1 refills | Status: DC

## 2022-01-11 NOTE — Progress Notes (Signed)
Patient ID: TARINA VOLK                 DOB: [birth year]                    MRN: [id number]     HPI: Vickie Graham is a 46 y.o. female patient referred to pharmacy clinic by Dr Aundra Dubin to restart weight loss therapy with GLP1-RA. PMH is significant for obesity, CHF, T2DM, and hypothyroidism. Most recent BMI 51.95.  Patient was previously on Ozempic and took 1 injection of 0.'25mg'$  and reported headaches and thirst.  Discontinued after 1 dose.  Husband has uncontrolled DM and recently had a foot amputation so patient is motivated to reduce blood sugar and weight.  She is now the sole bread winner in the household.  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 Reports being exhausted most of the day. She plans to take PTO next week to focus on her health.  Last A1c 6 months ago.  Labs: Lab Results  Component Value Date   HGBA1C 7.1 (A) 06/02/2021    Wt Readings from Last 1 Encounters:  01/11/22 266 lb (120.7 kg)    BP Readings from Last 1 Encounters:  01/11/22 118/76   Pulse Readings from Last 1 Encounters:  01/11/22 73       Component Value Date/Time   CHOL 152 04/17/2021 0844   TRIG 272 (H) 04/17/2021 0844   HDL 30 (L) 04/17/2021 0844   CHOLHDL 5.1 (H) 04/17/2021 0844   CHOLHDL 5.7 03/29/2021 1315   VLDL UNABLE TO CALCULATE IF TRIGLYCERIDE OVER 400 mg/dL 03/29/2021 1315   LDLCALC 77 04/17/2021 0844   LDLDIRECT 68.0 03/29/2021 1315    Past Medical History:  Diagnosis Date   ADHD (attention deficit hyperactivity disorder)    diagnosed at [facility] clinic.   Allergy    Anxiety    Social anxiety, generalized anxiety disorder.   Depression    Diabetes mellitus without complication ([place])    HFrEF (heart failure with reduced ejection fraction) ([place])    Hypertension     Current Outpatient Medications on File Prior to Visit  Medication Sig Dispense Refill   acetaminophen (TYLENOL) 325 MG tablet Take 2 tablets (650 mg total) by mouth every 6  (six) hours as needed for mild pain.     FLUoxetine (PROZAC) 40 MG capsule Take 1 capsule by mouth once daily 90 capsule 1   furosemide (LASIX) 40 MG tablet Take 1 tablet (40 mg total) by mouth daily. As needed for weight gain of 3 lbs in 24 hour or 5 lbs in a week 30 tablet 6   JARDIANCE 10 MG TABS tablet Take 1 tablet by mouth once daily 60 tablet 5   metFORMIN (GLUCOPHAGE-XR) 500 MG 24 hr tablet TAKE 1 TABLET BY MOUTH TWICE DAILY . APPOINTMENT REQUIRED FOR FUTURE REFILLS 60 tablet 0   metoprolol succinate (TOPROL-XL) 50 MG 24 hr tablet Take 1 tablet (50 mg total) by mouth daily. 90 tablet 3   potassium chloride SA (KLOR-CON) 20 MEQ tablet Take 1 tablet (20 mEq total) by mouth daily. As needed when lasix dose is taken 30 tablet 3   sacubitril-valsartan (ENTRESTO) 97-103 MG Take 1 tablet by mouth 2 (two) times daily. 60 tablet 11   spironolactone (ALDACTONE) 25 MG tablet Take 1 tablet by mouth once daily 60 tablet 6   No current facility-administered medications on file prior to visit.    Allergies  Allergen Reactions   Sulfa Antibiotics      Assessment/Plan:  1. Weight loss/DM - Discussed mechanism of action of Ozempic in depth with patient.  Advised many of her symptoms sound like classic DM symptoms.  Patient is willing to try again and she thinks maybe she did not give medication enough time.  Will update A1c today and send refill of Ozempic starting dose.  Plan to increase to 0.'5mg'$  sq weekly in 4 weeks.  Restart Ozempic 0.'25mg'$  weekly  Karren Cobble, PharmD, [place], [place], [place], [place], [place], [address] Phone: [phone], Fax: [phone]

## 2022-01-11 NOTE — Patient Instructions (Addendum)
It was good seeing you again ? ?We would like your A1c to be less than 7.0% ? ?We will recheck your A1c today ? ?I have resent your Ozempic to your pharmacy.  Your dose will be 0.'25mg'$  once a week for 4 weeks, then increase to 0.'5mg'$  ? ?Please call with any questions ? ?Karren Cobble, PharmD, BCACP, [place], CPP ?[address], [address] ?[place], [place], [address] ?Phone: [phone], Fax: [phone]  ?

## 2022-01-19 ENCOUNTER — Other Ambulatory Visit (HOSPITAL_COMMUNITY): Payer: Self-pay

## 2022-02-04 ENCOUNTER — Other Ambulatory Visit: Payer: Self-pay | Admitting: Family Medicine

## 2022-02-04 DIAGNOSIS — E119 Type 2 diabetes mellitus without complications: Secondary | ICD-10-CM

## 2022-02-20 ENCOUNTER — Encounter: Payer: Self-pay | Admitting: *Deleted

## 2022-02-28 ENCOUNTER — Telehealth (HOSPITAL_COMMUNITY): Payer: Self-pay | Admitting: Pharmacy Technician

## 2022-02-28 NOTE — Telephone Encounter (Signed)
Patient Advocate Encounter   Received notification from [facility] that prior authorization for Vickie Graham is required.   PA submitted on CoverMyMeds Key [id number] Status is pending   Will continue to follow.

## 2022-03-01 NOTE — Telephone Encounter (Signed)
Advanced Heart Failure Patient Advocate Encounter  Prior Authorization for Delene Loll has been approved.    PA# [id number] Effective dates: 02/28/22 through 02/28/23  Charlann Boxer, CPhT

## 2022-03-02 ENCOUNTER — Encounter (HOSPITAL_COMMUNITY): Payer: Self-pay

## 2022-03-02 ENCOUNTER — Telehealth (HOSPITAL_COMMUNITY): Payer: Self-pay | Admitting: Surgery

## 2022-03-02 ENCOUNTER — Ambulatory Visit (HOSPITAL_COMMUNITY)
Admission: RE | Admit: 2022-03-02 | Discharge: 2022-03-02 | Disposition: A | Discharge status: Home / Self Care | Payer: Medicaid Other | Source: Ambulatory Visit | Attending: Family Medicine | Admitting: Family Medicine

## 2022-03-02 VITALS — BP 100/60 | HR 64 | Wt 263.2 lb

## 2022-03-02 DIAGNOSIS — F419 Anxiety disorder, unspecified: Secondary | ICD-10-CM | POA: Insufficient documentation

## 2022-03-02 DIAGNOSIS — E1169 Type 2 diabetes mellitus with other specified complication: Secondary | ICD-10-CM

## 2022-03-02 DIAGNOSIS — Z7984 Long term (current) use of oral hypoglycemic drugs: Secondary | ICD-10-CM | POA: Insufficient documentation

## 2022-03-02 DIAGNOSIS — I428 Other cardiomyopathies: Secondary | ICD-10-CM | POA: Diagnosis not present

## 2022-03-02 DIAGNOSIS — I5020 Unspecified systolic (congestive) heart failure: Secondary | ICD-10-CM

## 2022-03-02 DIAGNOSIS — E119 Type 2 diabetes mellitus without complications: Secondary | ICD-10-CM

## 2022-03-02 DIAGNOSIS — E785 Hyperlipidemia, unspecified: Secondary | ICD-10-CM

## 2022-03-02 DIAGNOSIS — I471 Supraventricular tachycardia: Secondary | ICD-10-CM | POA: Diagnosis not present

## 2022-03-02 DIAGNOSIS — R0683 Snoring: Secondary | ICD-10-CM | POA: Diagnosis not present

## 2022-03-02 DIAGNOSIS — Z79899 Other long term (current) drug therapy: Secondary | ICD-10-CM | POA: Diagnosis not present

## 2022-03-02 DIAGNOSIS — Z6841 Body Mass Index (BMI) 40.0 and over, adult: Secondary | ICD-10-CM | POA: Insufficient documentation

## 2022-03-02 DIAGNOSIS — E669 Obesity, unspecified: Secondary | ICD-10-CM | POA: Insufficient documentation

## 2022-03-02 DIAGNOSIS — E039 Hypothyroidism, unspecified: Secondary | ICD-10-CM | POA: Diagnosis not present

## 2022-03-02 DIAGNOSIS — Z7901 Long term (current) use of anticoagulants: Secondary | ICD-10-CM | POA: Diagnosis not present

## 2022-03-02 DIAGNOSIS — Z139 Encounter for screening, unspecified: Secondary | ICD-10-CM

## 2022-03-02 DIAGNOSIS — I5022 Chronic systolic (congestive) heart failure: Secondary | ICD-10-CM | POA: Diagnosis present

## 2022-03-02 DIAGNOSIS — F32A Depression, unspecified: Secondary | ICD-10-CM | POA: Diagnosis not present

## 2022-03-02 DIAGNOSIS — I110 Hypertensive heart disease with heart failure: Secondary | ICD-10-CM | POA: Insufficient documentation

## 2022-03-02 LAB — LIPID PANEL
Cholesterol: 232 mg/dL — ABNORMAL HIGH (ref 0–200)
HDL: 29 mg/dL — ABNORMAL LOW (ref >40)
LDL Cholesterol: 137 mg/dL — ABNORMAL HIGH (ref 0–99)
Total CHOL/HDL Ratio: 8 RATIO
Triglycerides: 330 mg/dL — ABNORMAL HIGH (ref <150)
VLDL: 66 mg/dL — ABNORMAL HIGH (ref 0–40)

## 2022-03-02 LAB — BASIC METABOLIC PANEL
Anion gap: 7 (ref 5–15)
BUN: 11 mg/dL (ref 6–20)
CO2: 24 mmol/L (ref 22–32)
Calcium: 9.1 mg/dL (ref 8.9–10.3)
Chloride: 106 mmol/L (ref 98–111)
Creatinine, Ser: 0.76 mg/dL (ref 0.44–1.00)
GFR, Estimated: >60 mL/min (ref >60)
Glucose, Bld: 105 mg/dL — ABNORMAL HIGH (ref 70–99)
Potassium: 4.5 mmol/L (ref 3.5–5.1)
Sodium: 137 mmol/L (ref 135–145)

## 2022-03-02 MED ORDER — EMPAGLIFLOZIN 10 MG PO TABS: 10.0000 mg | ORAL_TABLET | Freq: Every day | ORAL | 1 refills | Status: DC

## 2022-03-02 MED ORDER — SACUBITRIL-VALSARTAN 97-103 MG PO TABS: 1.0000 | ORAL_TABLET | Freq: Two times a day (BID) | ORAL | 1 refills | Status: DC

## 2022-03-02 MED ORDER — POTASSIUM CHLORIDE CRYS ER 20 MEQ PO TBCR: 20.0000 meq | EXTENDED_RELEASE_TABLET | Freq: Every day | ORAL | 3 refills | Status: DC

## 2022-03-02 MED ORDER — FUROSEMIDE 40 MG PO TABS: 40.0000 mg | ORAL_TABLET | Freq: Every day | ORAL | 6 refills | Status: DC

## 2022-03-02 MED ORDER — METOPROLOL SUCCINATE ER 50 MG PO TB24: 50.0000 mg | ORAL_TABLET | Freq: Every day | ORAL | 3 refills | Status: DC

## 2022-03-02 MED ORDER — SPIRONOLACTONE 25 MG PO TABS: 25.0000 mg | ORAL_TABLET | Freq: Every day | ORAL | 6 refills | Status: DC

## 2022-03-02 NOTE — Progress Notes (Signed)
ADVANCED HF CLINIC NOTE  Primary Care: Dr. Alcus Dad Primary HF Cardiologist: Dr. Aundra Dubin  HPI: 46 y.o. female w/ h/o systolic HF/nonischemic CM, obesity, ADHD, anxiety, depression, hypothyroidism, GERD, PACs, DM2.  Presented to ED on 02/13/21 w/ complaints of 3 week h/o progressive DOE, LEE and abdominal fullness. Found to be in acute CHF. BNP 1388. CXR w/ mild congestive CHF w/ interstitial edema. EKG showed ST 120 bpm. HS trop 48>>50.  TSH 13.416. Free T4 WNL. Chest CT negative for PE. She was admitted for CHF and started on IV Lasix. Echo showed biventricular dysfunction. LVEF severely reduced 20-25% w/ global HK. Mild LVH. RV moderately reduced w/ estimated RVSP 36 mmHg. Also found to have new diagnosis of T2D, Hgb A1c 11. She responded well to diuresis and was transitioned to oral Lasix and discharged on GDMT.  Seen in ED on 06/13/21 with fatigue. Fluoxetine and metformin recently increased at PCP visit 09/16. Chest x-ray with evidence of mild interstitial edema. It was recommended to increase lasix to 80 mg daily for 3 days. Declined COVID test as had negative result at home. No labs or ECG done.    Echo in 10/22 showed EF 40-45%, mild LV enlargement, normal RV, normal IVC.   Today she returns for HF follow up. Overall feeling fine. She is not short of breath with activity or work duties. Works full time as Surveyor, quantity of a pre-school. Denies palpitations, CP, dizziness, edema, or PND/Orthopnea. Appetite ok. No fever or chills. Weight at home 255 pounds. Taking all medications. Occasionally uses Lasix, not recently. Failed semaglutide x 2 due to GI side effects. Losing Medicaid at the end of the month, planning on getting health insurance through the market.  Labs (9/22): TSH mildly elevated, K 4, creatinine 1.07, LFTs normal Labs (12/22): K 3.5, creatinine 0.86 Labs (3/23): K 4.0, creatinine 0.89  PMH: 1. Type 2 diabetes 2. ADHD 3. HTN 4. Depression 5. GERD 6.  Obesity 7. Chronic systolic CHF:  Nonischemic cardiomyopathy.  - Echo (5/22) with EF 20-25%, global HK, moderately decreased RV systolic function.  - RHC/LHC: No significant CAD; mean RA 6, PA 53/20 mean 30, mean PCWP 20, CI (Fick) 2.74, PVR 1.7 WU.  - Cardiac MRI (6/22): Mildly dilated LV with EF 21%, RV EF 30%, no definite LGE (difficult images).  - Echo (10/22) with EF 40-45%, mild LV enlargement, normal RV.  8. Hypothyroidism 9. Atrial tachycardia 10. Hyperlipidemia.   ROS: All systems reviewed and negative except as per HPI.   FH: No family history of cardiomyopathy.   Current Outpatient Medications  Medication Sig Dispense Refill   acetaminophen (TYLENOL) 325 MG tablet Take 2 tablets (650 mg total) by mouth every 6 (six) hours as needed for mild pain.     FLUoxetine (PROZAC) 40 MG capsule Take 1 capsule by mouth once daily 90 capsule 1   furosemide (LASIX) 40 MG tablet Take 1 tablet (40 mg total) by mouth daily. As needed for weight gain of 3 lbs in 24 hour or 5 lbs in a week 30 tablet 6   JARDIANCE 10 MG TABS tablet Take 1 tablet by mouth once daily 60 tablet 5   metFORMIN (GLUCOPHAGE-XR) 500 MG 24 hr tablet TAKE 1 TABLET BY MOUTH TWICE DAILY . APPOINTMENT REQUIRED FOR FUTURE REFILLS 60 tablet 0   metoprolol succinate (TOPROL-XL) 50 MG 24 hr tablet Take 1 tablet (50 mg total) by mouth daily. 90 tablet 3   potassium chloride SA (KLOR-CON) 20 MEQ tablet  Take 1 tablet (20 mEq total) by mouth daily. As needed when lasix dose is taken 30 tablet 3   sacubitril-valsartan (ENTRESTO) 97-103 MG Take 1 tablet by mouth 2 (two) times daily. 60 tablet 11   spironolactone (ALDACTONE) 25 MG tablet Take 1 tablet by mouth once daily 60 tablet 6   No current facility-administered medications for this encounter.   Allergies  Allergen Reactions   Sulfa Antibiotics    Social History   Socioeconomic History   Marital status: Married    Spouse name: Not on file   Number of children: Not on file    Years of education: Not on file   Highest education level: Not on file  Occupational History   Not on file  Tobacco Use   Smoking status: Former    Types: Cigarettes    Quit date: 01/12/2010    Years since quitting: 12.1   Smokeless tobacco: Never  Vaping Use   Vaping Use: Never used  Substance and Sexual Activity   Alcohol use: No   Drug use: No   Sexual activity: Not on file  Other Topics Concern   Not on file  Social History Narrative   Marital status: divorced and engaged since 2011.     Children: 2 children      Lives with fiance and two children (31, 10)      Employment: childcare early development; 3s and 4s. Surveyor, quantity      Tobacco: none      Alcohol: none      Drugs; None      Exercise: sporadic.           Social Determinants of Health   Financial Resource Strain: Medium Risk (02/15/2021)   Overall Financial Resource Strain (CARDIA)    Difficulty of Paying Living Expenses: Somewhat hard  Food Insecurity: No Food Insecurity (03/02/2021)   Hunger Vital Sign    Worried About Running Out of Food in the Last Year: Never true    Ran Out of Food in the Last Year: Never true  Transportation Needs: No Transportation Needs (02/15/2021)   PRAPARE - Hydrologist (Medical): No    Lack of Transportation (Non-Medical): No  Physical Activity: Not on file  Stress: Not on file  Social Connections: Not on file  Intimate Partner Violence: Not on file   BP 100/60   Pulse 64   Wt 119.4 kg (263 lb 3.2 oz)   SpO2 95%   BMI 51.40 kg/m   Wt Readings from Last 3 Encounters:  03/02/22 119.4 kg (263 lb 3.2 oz)  01/11/22 120.7 kg (266 lb)  12/26/21 119.7 kg (263 lb 12.8 oz)   PHYSICAL EXAM: General:  NAD. No resp difficulty HEENT: Normal Neck: Supple. No JVD. Carotids 2+ bilat; no bruits. No lymphadenopathy or thryomegaly appreciated. Cor: PMI nondisplaced. Regular rate & rhythm. No rubs, gallops or murmurs. Lungs: Clear Abdomen: Obese,  soft, nontender, nondistended. No hepatosplenomegaly. No bruits or masses. Good bowel sounds. Extremities: No cyanosis, clubbing, rash, edema Neuro: Alert & oriented x 3, cranial nerves grossly intact. Moves all 4 extremities w/o difficulty. Affect pleasant.  ASSESSMENT & PLAN:  1. Chronic systolic CHF:  New onset cardiomyopathy 5/22, nonischemic cardiomyopathy. Echo in 5/22 with EF 20-25%, moderately decreased RV systolic function.  LHC/RHC in 6/22 with preserved cardiac output, no significant coronary disease (nonischemic cardiomyopathy). Cardiac MRI with LV EF 21%, RV EF 30%, no myocardial LGE.  No ETOH, no drugs, no FH  of CAD or CMP. Consider prior viral myocarditis, effects of diabetes itself, or newly-diagnosed hypothyroidism.  However, patient also had frequent episodes of rapid atrial tachycardia in the hospital and said that her HR had often been up to 150 bpm at home on her Apple Watch. Concern this may be tachycardia-mediated CMP.  Echo in 11/22 showed EF up to 40-45%, normal RV.  NYHA class II symptoms, not volume overloaded.  - Continue Lasix 40 mg prn.  - Continue Entresto 97/103 mg bid. BMET today. - Continue sprionolactone 25 mg daily.  - Continue Toprol XL 50 mg daily (did not tolerate 100 mg due to dizziness and fatigue).  - Continue Jardiance 10 mg daily. - EF is out of ICD range on last echo. Update echo next visit. 2. Type 2 diabetes: Per PCP.  3. Hypothyroidism: Subclinical, mild TSH elevation.  4. Hyperlipidemia: Unable to tolerate statin.  - I will check lipids today off statin.  5. Atrial tachycardia: Frequent episodes noted during 5/22-6/22 admission, recurred when amiodarone stopped.  On questioning, her HR had frequently been elevated as high as 150s at home.  I am concerned that she could have a tachy-mediated CMP from AT with improvement in EF after suppression.  She has stopped amiodarone. She rarely has palpitations and elevated HR at this point.   - She will stay off  amiodarone for now.  - EP saw her, they thought that her high BMI makes ablation less likely to be successful.  Would consider in future if she can lose weight.  - Increase Toprol XL as above.  6. Obesity: She is working on weight loss through dietary changes and increased activity. Body mass index is 51.4 kg/m.  Weight down another 3 lbs.  - She has failed semaglutide x 2 due to GI side effects. 7. Snores: She has been referred for in-hospital sleep study (insurance will not cover home SS).  8. SDOH: Engage HFSW to help with insurance. Pharmacy helping with Delene Loll and Jardiance patient assistance.  Follow up in 4 months Dr. Aundra Dubin, will update echo at this visit.   Maricela Bo [facility] FNP-BC 03/02/2022

## 2022-03-02 NOTE — Telephone Encounter (Signed)
-----   Message from Rafael Bihari, [place] sent at 03/02/2022 10:38 AM EDT ----- LDL and triglycerides are elevated (however she was not fasting, so TGs may be falsely elevated).  Please refer to Lipid Clinic to discuss options, she is intolerant to statins.

## 2022-03-02 NOTE — Patient Instructions (Addendum)
Good to see you today!  No medication changes  Labs done today, your results will be available in MyChart, we will contact you for abnormal readings.  Your physician has requested that you have an echocardiogram. Echocardiography is a painless test that uses sound waves to create images of your heart. It provides your doctor with information about the size and shape of your heart and how well your heart's chambers and valves are working. This procedure takes approximately one hour. There are no restrictions for this procedure.   Your physician recommends that you schedule a follow-up appointment in: 4 months with echocardiogram  If you have any questions or concerns before your next appointment please send [place] a message through [place] or call our office at [phone].    TO LEAVE A MESSAGE FOR THE NURSE SELECT OPTION 2, PLEASE LEAVE A MESSAGE INCLUDING: YOUR NAME DATE OF BIRTH CALL BACK NUMBER REASON FOR CALL**this is important as we prioritize the call backs  YOU WILL RECEIVE A CALL BACK THE SAME DAY AS LONG AS YOU CALL BEFORE 4:00 PM  At the [facility], you and your health needs are our priority. As part of our continuing mission to provide you with exceptional heart care, we have created designated Provider Care Teams. These Care Teams include your primary Cardiologist (physician) and Advanced Practice Providers (APPs- Physician Assistants and Nurse Practitioners) who all work together to provide you with the care you need, when you need it.   You may see any of the following providers on your designated Care Team at your next follow up: Dr Glori Bickers Dr Haynes Kerns, NP Lyda Jester, [place] [facility] [place], [place] Audry Riles, PharmD   Please be sure to bring in all your medications bottles to every appointment.

## 2022-03-02 NOTE — Addendum Note (Signed)
Encounter addended by: Jorge Ny, LCSW on: 03/02/2022 2:21 PM  Actions taken: Clinical Note Signed

## 2022-03-02 NOTE — Addendum Note (Signed)
Encounter addended by: Payton Mccallum, RN on: 03/02/2022 10:38 AM  Actions taken: Pharmacy for encounter modified, Order list changed

## 2022-03-02 NOTE — Progress Notes (Signed)
H&V Care Navigation CSW Progress Note  Clinical Social Worker met with patient to discuss insurance concerns.  Patient is participating in a Managed Medicaid Plan:  Yes  SDOH Screenings   Alcohol Screen: Not on file  Depression (PHQ2-9): Low Risk  (06/02/2021)   Depression (PHQ2-9)    PHQ-2 Score: 3  Recent Concern: Depression (PHQ2-9) - Medium Risk (04/10/2021)   Depression (PHQ2-9)    PHQ-2 Score: 8  Financial Resource Strain: Medium Risk (02/15/2021)   Overall Financial Resource Strain (CARDIA)    Difficulty of Paying Living Expenses: Somewhat hard  Food Insecurity: No Food Insecurity (03/02/2021)   Hunger Vital Sign    Worried About Running Out of Food in the Last Year: Never true    Ran Out of Food in the Last Year: Never true  Housing: Low Risk  (02/15/2021)   Housing    Last Housing Risk Score: 0  Physical Activity: Not on file  Social Connections: Not on file  Stress: Not on file  Tobacco Use: Medium Risk (03/02/2022)   Patient History    Smoking Tobacco Use: Former    Smokeless Tobacco Use: Never    Passive Exposure: Not on file  Transportation Needs: No Transportation Needs (02/15/2021)   PRAPARE - Hydrologist (Medical): No    Lack of Transportation (Non-Medical): No    CSW informed that pt will be losing her Medicaid soon and there are concerns about getting alternative insurance and covering medications.  Pt reports she had an increase in income which has put her over the limit for Medicaid- has confirmed this with her Medicaid case worker.  Her plan is to apply for ACA insurance to start July 1 when her Medicaid ends- she is working on applying now but having an issue with their website- plans to call to fix this ASAP.  CSW worked with pt on initiating applications for [employer] assistance in case their is a lap in coverage.  Told pt that if she gets coverage she will be eligible for copay cards to help with cost.  She knows to  call us if she can't get her medications and we will assist with this.  Will continue to follow and assist as needed  Jorge Ny, [facility] Desk#: [phone] Cell#: [phone]

## 2022-03-02 NOTE — Addendum Note (Signed)
Encounter addended by: Stanford Scotland, RN on: 03/02/2022 12:15 PM  Actions taken: Pharmacy for encounter modified, Order list changed

## 2022-03-04 ENCOUNTER — Other Ambulatory Visit: Payer: Self-pay | Admitting: Family Medicine

## 2022-03-04 DIAGNOSIS — E119 Type 2 diabetes mellitus without complications: Secondary | ICD-10-CM

## 2022-03-05 NOTE — Telephone Encounter (Signed)
Denied refill request for metformin. Patient either needs to schedule an appointment in our office OR request Metformin from her cardiology office who has also been following her diabetes. OK to send new request if she schedules an appointment.

## 2022-03-08 ENCOUNTER — Ambulatory Visit: Payer: Medicaid Other | Admitting: Pharmacist Clinician (PhC)/ Clinical Pharmacy Specialist

## 2022-03-08 DIAGNOSIS — E785 Hyperlipidemia, unspecified: Secondary | ICD-10-CM | POA: Diagnosis not present

## 2022-03-08 DIAGNOSIS — E1169 Type 2 diabetes mellitus with other specified complication: Secondary | ICD-10-CM | POA: Diagnosis not present

## 2022-03-08 MED ORDER — ATORVASTATIN CALCIUM 20 MG PO TABS: 20.0000 mg | ORAL_TABLET | Freq: Every day | ORAL | 3 refills | Status: DC

## 2022-03-08 NOTE — Progress Notes (Unsigned)
03/08/2022 Vickie Graham [birth year] [id number]   HPI:  Vickie Graham is a 46 y.o. female patient of Dr ***, who presents today for a lipid clinic evaluation.  See pertinent past medical history below.  Rosuvastatin 20 mg until last fall, had a gap before refilling - stopped because she felt sleepy/fatigue    Took ozempic x 4 doses - GI upset N/V, so chose to d/c medication  Past Medical History: CHF   DM2   hypothyroidism          Current Medications:  Cholesterol Goals:   Intolerant/previously tried:  Family history:  parents both living, no heart disease, now both in their 47's; 3 brothers  - doesn't talk to family; children 25, 27 both healthy  Diet: eats out more, now starting to cook more but still 50/50; out is fast/grab and go; vegetables at every meal;  more chicken and fish than b/p; some snacking - granola bars, cut back on chocolate habit  Exercise:  none  Labs:   6/23  TC 232, TG 330, HDL 29, LDL 137   Current Outpatient Medications  Medication Sig Dispense Refill   acetaminophen (TYLENOL) 325 MG tablet Take 2 tablets (650 mg total) by mouth every 6 (six) hours as needed for mild pain.     empagliflozin (JARDIANCE) 10 MG TABS tablet Take 1 tablet (10 mg total) by mouth daily. 90 tablet 1   FLUoxetine (PROZAC) 40 MG capsule Take 1 capsule by mouth once daily 90 capsule 1   furosemide (LASIX) 40 MG tablet Take 1 tablet (40 mg total) by mouth daily. As needed for weight gain of 3 lbs in 24 hour or 5 lbs in a week 30 tablet 6   metFORMIN (GLUCOPHAGE-XR) 500 MG 24 hr tablet TAKE 1 TABLET BY MOUTH TWICE DAILY . APPOINTMENT REQUIRED FOR FUTURE REFILLS 60 tablet 0   metoprolol succinate (TOPROL-XL) 50 MG 24 hr tablet Take 1 tablet (50 mg total) by mouth daily. HF Fund 90 tablet 3   potassium chloride SA (KLOR-CON M) 20 MEQ tablet Take 1 tablet (20 mEq total) by mouth daily. As needed when lasix dose is taken    HF Fund 30 tablet 3   sacubitril-valsartan (ENTRESTO) 97-103  MG Take 1 tablet by mouth 2 (two) times daily. 180 tablet 1   spironolactone (ALDACTONE) 25 MG tablet Take 1 tablet (25 mg total) by mouth daily. HF fund 60 tablet 6   No current facility-administered medications for this visit.    Allergies  Allergen Reactions   Sulfa Antibiotics     Past Medical History:  Diagnosis Date   ADHD (attention deficit hyperactivity disorder)    diagnosed at [facility] clinic.   Allergy    Anxiety    Social anxiety, generalized anxiety disorder.   Depression    Diabetes mellitus without complication ([place])    HFrEF (heart failure with reduced ejection fraction) ([place])    Hypertension     There were no vitals taken for this visit.   No problem-specific Assessment & Plan notes found for this encounter.   Tommy Medal PharmD CPP [facility] [address] [place], [place] [address] [phone]

## 2022-03-08 NOTE — Patient Instructions (Signed)
Your Results:             Your most recent labs Goal  Total Cholesterol 232 < 200  Triglycerides 330 < 150  HDL (happy/good cholesterol) 29 > 40  LDL (lousy/bad cholesterol 137 < 100   Medication changes:  Start atorvastatin 20 mg once daily.    Lab orders:  We want to repeat labs after 2-3 months.  We will send you a lab order to remind you once we get closer to that time.    High Triglycerides Eating Plan Triglycerides are a type of fat in the blood. High levels of triglycerides can increase your risk of heart disease and stroke. If your triglyceride levels are high, choosing the right foods can help lower your triglycerides and keep your heart healthy. Work with your health care provider or a dietitian to develop an eating plan that is right for you. What are tips for following this plan? General guidelines  Lose weight, if you are overweight. For most people, losing 5-10 lb (2-5 kg) helps lower triglyceride levels. A weight-loss plan may include: 30 minutes of exercise at least 5 days a week. Reducing the amount of calories, sugar, and fat you eat. Eat a wide variety of fresh fruits, vegetables, and whole grains. These foods are high in fiber. Eat foods that contain healthy fats, such as fatty fish, nuts, seeds, and olive oil. Avoid foods that are high in added sugar, added salt (sodium), and saturated fat. Avoid low-fiber, refined carbohydrates such as white bread, crackers, noodles, and white rice. Avoid foods with trans fats or partially hydrogenated oils, such as fried foods or stick margarine. If you drink alcohol: Limit how much you have to: 0-1 drink a day for women who are not pregnant. 0-2 drinks a day for men. Your health care provider may recommend that you drink less than these amounts depending on your overall health. Know how much alcohol is in a drink. In the [place] one drink equals one 12 oz bottle of beer (355 mL), one 5 oz glass of wine (148 mL), or one 1 oz  glass of hard liquor (44 mL). Reading food labels Check food labels for: The amount of saturated fat. Choose foods with no or very little saturated fat (less than 2 g). The amount of trans fat. Choose foods with no transfat. The amount of cholesterol. Choose foods that are low in cholesterol. The amount of sodium. Choose foods with less than 140 milligrams (mg) per serving. Shopping Buy dairy products labeled as nonfat (skim) or low-fat (1%). Avoid buying processed or prepackaged foods. These are often high in added sugar, sodium, and fat. Cooking Choose healthy fats when cooking, such as olive oil, avocado oil, or canola oil. Cook foods using lower fat methods, such as baking, broiling, boiling, or grilling. Make your own sauces, dressings, and marinades when possible, instead of buying them. Store-bought sauces, dressings, and marinades are often high in sodium and sugar. Meal planning Eat more home-cooked food and less restaurant, buffet, and fast food. Eat fatty fish at least 2 times each week. Examples of fatty fish include salmon, trout, sardines, mackerel, tuna, and herring. If you eat whole eggs, do not eat more than 4 egg yolks per week. What foods should I eat? Fruits All fresh, canned (in natural juice), or frozen fruits. Vegetables Fresh or frozen vegetables. Low-sodium canned vegetables. Grains Whole wheat or whole grain breads, crackers, cereals, and pasta. Unsweetened oatmeal. Bulgur. Barley. Quinoa. Brown rice. Whole wheat  flour tortillas. Meats and other proteins Skinless chicken or [place]. Ground chicken or [place]. Lean cuts of pork, trimmed of fat. Fish and seafood, especially salmon, trout, and herring. Egg whites. Dried beans, peas, or lentils. Unsalted nuts or seeds. Unsalted canned beans. Natural peanut or almond butter or other nut butters. Dairy Low-fat dairy products. Skim or low-fat (1%) milk. Reduced fat (2%) and low-sodium cheese. Low-fat ricotta cheese.  Low-fat cottage cheese. Plain, low-fat yogurt. Fats and oils Tub margarine without trans fats. Light or reduced-fat mayonnaise. Light or reduced-fat salad dressings. Avocado. Safflower, olive, sunflower, soybean, and canola oils. The items listed above may not be a complete list of recommended foods and beverages. Talk with your dietitian about what dietary choices are best for you. What foods should I avoid? Fruits Sweetened dried fruit. Canned fruit in syrup. Fruit juice. Vegetables Creamed or fried vegetables. Vegetables in a cheese sauce. Grains White bread. White (regular) pasta. White rice. Cornbread. Bagels. Pastries. Crackers that contain trans fat. Meats and other proteins Fatty cuts of meat. Ribs. Chicken wings. Berniece Salines. Sausage. Bologna. Salami. Chitterlings. Fatback. Hot dogs. Bratwurst. Packaged lunch meats. Dairy Whole or reduced-fat (2%) milk. Half-and-half. Cream cheese. Full-fat or sweetened yogurt. Full-fat cheese. Nondairy creamers. Whipped toppings. Processed cheese or cheese spreads. Cheese curds. Fats and oils Butter. Stick margarine. Lard. Shortening. Ghee. Bacon fat. Tropical oils, such as coconut, palm kernel, or palm oils. Beverages Alcohol. Sweetened drinks, such as soda, lemonade, fruit drinks, or punches. Sweets and desserts Corn syrup. Sugars. Honey. Molasses. Candy. Jam and jelly. Syrup. Sweetened cereals. Cookies. Pies. Cakes. Donuts. Muffins. Ice cream. Condiments Store-bought sauces, dressings, and marinades that are high in sugar, such as ketchup and barbecue sauce. The items listed above may not be a complete list of foods and beverages you should avoid. Talk with your dietitian about what dietary choices are best for you. Summary High levels of triglycerides can increase the risk of heart disease and stroke. Choosing the right foods can help lower your triglycerides. Eat plenty of fresh fruits, vegetables, and whole grains. Choose low-fat dairy and lean  meats. Eat fatty fish at least twice a week. Avoid processed and prepackaged foods with added sugar, sodium, saturated fat, and trans fat. If you need suggestions or have questions about what types of food are good for you, talk with your health care provider or a dietitian. This information is not intended to replace advice given to you by your health care provider. Make sure you discuss any questions you have with your health care provider. Document Revised: 01/13/2021 Document Reviewed: 01/13/2021 Elsevier Patient Education  [place] you for choosing [employer]

## 2022-03-09 ENCOUNTER — Encounter: Payer: Self-pay | Admitting: Pharmacist Clinician (PhC)/ Clinical Pharmacy Specialist

## 2022-03-09 MED ORDER — METFORMIN HCL ER 500 MG PO TB24: ORAL_TABLET | ORAL | 0 refills | Status: DC

## 2022-03-22 ENCOUNTER — Ambulatory Visit: Payer: Medicaid Other | Admitting: Family Medicine

## 2022-03-30 ENCOUNTER — Encounter: Payer: Self-pay | Admitting: Family Medicine

## 2022-03-30 ENCOUNTER — Telehealth (HOSPITAL_COMMUNITY): Payer: Self-pay | Admitting: *Deleted

## 2022-03-30 ENCOUNTER — Ambulatory Visit (INDEPENDENT_AMBULATORY_CARE_PROVIDER_SITE_OTHER): Payer: Medicaid Other | Admitting: Family Medicine

## 2022-03-30 ENCOUNTER — Other Ambulatory Visit (HOSPITAL_COMMUNITY)
Admission: RE | Admit: 2022-03-30 | Discharge: 2022-03-30 | Disposition: A | Discharge status: Home / Self Care | Payer: Medicaid Other | Source: Ambulatory Visit | Attending: Family Medicine | Admitting: Family Medicine

## 2022-03-30 VITALS — BP 110/73 | HR 58 | Ht 60.0 in | Wt 266.0 lb

## 2022-03-30 DIAGNOSIS — Z124 Encounter for screening for malignant neoplasm of cervix: Secondary | ICD-10-CM | POA: Insufficient documentation

## 2022-03-30 DIAGNOSIS — Z23 Encounter for immunization: Secondary | ICD-10-CM

## 2022-03-30 DIAGNOSIS — F419 Anxiety disorder, unspecified: Secondary | ICD-10-CM | POA: Diagnosis not present

## 2022-03-30 DIAGNOSIS — E1169 Type 2 diabetes mellitus with other specified complication: Secondary | ICD-10-CM | POA: Diagnosis not present

## 2022-03-30 DIAGNOSIS — E785 Hyperlipidemia, unspecified: Secondary | ICD-10-CM

## 2022-03-30 DIAGNOSIS — E119 Type 2 diabetes mellitus without complications: Secondary | ICD-10-CM | POA: Diagnosis present

## 2022-03-30 DIAGNOSIS — Z1211 Encounter for screening for malignant neoplasm of colon: Secondary | ICD-10-CM

## 2022-03-30 MED ORDER — FLUOXETINE HCL 40 MG PO CAPS: 40.0000 mg | ORAL_CAPSULE | Freq: Every day | ORAL | 1 refills | Status: DC

## 2022-03-30 MED ORDER — METFORMIN HCL ER 500 MG PO TB24: 500.0000 mg | ORAL_TABLET | Freq: Two times a day (BID) | ORAL | 2 refills | Status: DC

## 2022-03-30 NOTE — Progress Notes (Unsigned)
    SUBJECTIVE:   CHIEF COMPLAINT / HPI:   Type 2 Diabetes Patient is a 46 y.o. female who presents today for diabetes follow-up.  Home medications include: Metformin '500mg'$  BID and Jardiance '10mg'$  daily. Did not tolerate Ozempic.  Patient reports excellent medication compliance. Patient checks sugar at home. Typically range 120s-130s. No hypoglycemic episodes/symptoms She endorses a lot of stress at work and 6lb weight gain. Does not think it's fluid/heart failure related.  Most recent A1Cs:  Lab Results  Component Value Date   HGBA1C 6.1 (H) 01/11/2022   HGBA1C 7.1 (A) 06/02/2021   HGBA1C 11.0 (H) 02/13/2021   Last Microalbumin, LDL, Creatinine: Lab Results  Component Value Date   LDLCALC 137 (H) 03/02/2022   CREATININE 0.76 03/02/2022   Patient is not up to date on diabetic eye. Patient is due for diabetic foot exam this month.  Anxiety Finds fluoxetine '40mg'$  daily very helpful.   PERTINENT  PMH / PSH: HFrEF, atrial tachycardia, obesity  OBJECTIVE:   BP 110/73   Pulse (!) 58   Ht 5' (1.524 m)   Wt 266 lb (120.7 kg)   LMP 03/12/2022 (Approximate)   SpO2 97%   BMI 51.95 kg/m   General: NAD, pleasant, able to participate in exam Respiratory: No respiratory distress Skin: warm and dry, no rashes noted Psych: Normal affect and mood Neuro: grossly intact GU/GYN: Exam performed in the presence of a chaperone. External genitalia within normal limits.  Vaginal mucosa pink, moist, normal rugae.  Nonfriable cervix without lesions, no discharge or bleeding noted on speculum exam.  Bimanual exam limited due to habitus but revealed normal, nongravid uterus.  No cervical motion tenderness. No adnexal masses appreciated bilaterally.    Diabetic foot exam was performed with the following findings:   No deformities, ulcerations, or other skin breakdown Normal sensation of 10g monofilament Intact posterior tibialis and dorsalis pedis pulses     ASSESSMENT/PLAN:   Type 2  diabetes mellitus without complications ([place]) Very well-controlled. Last A1c 6.1% two months ago. Continue current medications (Metformin '500mg'$  BID, Jardiance '10mg'$  daily). Unfortunately patient did not tolerate statin x2. Foot exam wnl today. Encouraged patient to have diabetic eye exam.  Anxiety Adequately controlled on fluoxetine '40mg'$  daily. Refills provided.  Hyperlipidemia associated with type 2 diabetes mellitus ([place]) Lipids not at goal. Unfortunately patient did not tolerate rosuvastatin or atorvastatin (self discontinued due to fatigue/intermittent shortness of breath). Follows very closely with cardiology- will defer additional management to them.  Morbid obesity ([place]) Unfortunately she did not tolerate GLP-1. Not interested in re-trial. Continue working on lifestyle modifications. Highly encouraged her to reschedule sleep study.  Health maintenance -Pap with HPV cotesting done today -TDaP administered today -Referral to GI for screening colonoscopy -Recommend obtaining Hep C with next lab draw  Alcus Dad, MD [place]

## 2022-03-30 NOTE — Telephone Encounter (Signed)
Pt called c/o weight gain. Pt said her weight is up 5lbs x 2weeks. Pt denies shortness of breath but said she is not urinating a lot with her diuretic. Pt did not want to adjust meds over the phone but asked to be seen in clinic. Appt scheduled.

## 2022-03-30 NOTE — Patient Instructions (Addendum)
It was great to see you!  Things we discussed today: -We did your Pap smear (screening for cervical cancer).  I will send you a MyChart message with the results. -You are overdue for diabetic eye exam.  Please schedule an appointment with your eye doctor.  Asked them to fax me the results. -It is time to start screening for colorectal cancer.  I have placed a referral to GI for colonoscopy.  They will call you to schedule an appointment. -I highly highly recommend rescheduling your sleep study.   -I have sent refills on your fluoxetine and metformin.  Take care, Dr. Rock Nephew

## 2022-03-31 NOTE — Assessment & Plan Note (Addendum)
Lipids not at goal. Unfortunately patient did not tolerate rosuvastatin or atorvastatin (self discontinued due to fatigue/intermittent shortness of breath). Follows very closely with cardiology- will defer additional management to them.

## 2022-03-31 NOTE — Assessment & Plan Note (Addendum)
Very well-controlled. Last A1c 6.1% two months ago. Continue current medications (Metformin '500mg'$  BID, Jardiance '10mg'$  daily). Unfortunately patient did not tolerate statin x2. Foot exam wnl today. Encouraged patient to have diabetic eye exam.

## 2022-03-31 NOTE — Assessment & Plan Note (Signed)
Unfortunately she did not tolerate GLP-1. Not interested in re-trial. Continue working on lifestyle modifications. Highly encouraged her to reschedule sleep study.

## 2022-03-31 NOTE — Assessment & Plan Note (Signed)
Adequately controlled on fluoxetine '40mg'$  daily. Refills provided.

## 2022-04-02 ENCOUNTER — Ambulatory Visit (HOSPITAL_COMMUNITY)
Admission: RE | Admit: 2022-04-02 | Discharge: 2022-04-02 | Disposition: A | Discharge status: Home / Self Care | Payer: Medicaid Other | Source: Ambulatory Visit | Attending: Family Medicine | Admitting: Family Medicine

## 2022-04-02 ENCOUNTER — Encounter (HOSPITAL_COMMUNITY): Payer: Self-pay

## 2022-04-02 VITALS — BP 100/60 | HR 78 | Wt 262.4 lb

## 2022-04-02 DIAGNOSIS — E785 Hyperlipidemia, unspecified: Secondary | ICD-10-CM

## 2022-04-02 DIAGNOSIS — Z79899 Other long term (current) drug therapy: Secondary | ICD-10-CM | POA: Insufficient documentation

## 2022-04-02 DIAGNOSIS — Z7984 Long term (current) use of oral hypoglycemic drugs: Secondary | ICD-10-CM | POA: Insufficient documentation

## 2022-04-02 DIAGNOSIS — Z7901 Long term (current) use of anticoagulants: Secondary | ICD-10-CM | POA: Insufficient documentation

## 2022-04-02 DIAGNOSIS — I5022 Chronic systolic (congestive) heart failure: Secondary | ICD-10-CM

## 2022-04-02 DIAGNOSIS — I4719 Other supraventricular tachycardia: Secondary | ICD-10-CM

## 2022-04-02 DIAGNOSIS — F419 Anxiety disorder, unspecified: Secondary | ICD-10-CM | POA: Diagnosis not present

## 2022-04-02 DIAGNOSIS — Z6841 Body Mass Index (BMI) 40.0 and over, adult: Secondary | ICD-10-CM | POA: Insufficient documentation

## 2022-04-02 DIAGNOSIS — I428 Other cardiomyopathies: Secondary | ICD-10-CM | POA: Insufficient documentation

## 2022-04-02 DIAGNOSIS — E119 Type 2 diabetes mellitus without complications: Secondary | ICD-10-CM | POA: Diagnosis not present

## 2022-04-02 DIAGNOSIS — I110 Hypertensive heart disease with heart failure: Secondary | ICD-10-CM | POA: Insufficient documentation

## 2022-04-02 DIAGNOSIS — E669 Obesity, unspecified: Secondary | ICD-10-CM | POA: Diagnosis not present

## 2022-04-02 DIAGNOSIS — E039 Hypothyroidism, unspecified: Secondary | ICD-10-CM

## 2022-04-02 DIAGNOSIS — R0683 Snoring: Secondary | ICD-10-CM

## 2022-04-02 DIAGNOSIS — F32A Depression, unspecified: Secondary | ICD-10-CM | POA: Insufficient documentation

## 2022-04-02 DIAGNOSIS — I471 Supraventricular tachycardia: Secondary | ICD-10-CM | POA: Diagnosis not present

## 2022-04-02 LAB — BASIC METABOLIC PANEL
Anion gap: 14 (ref 5–15)
BUN: 23 mg/dL — ABNORMAL HIGH (ref 6–20)
CO2: 18 mmol/L — ABNORMAL LOW (ref 22–32)
Calcium: 9.6 mg/dL (ref 8.9–10.3)
Chloride: 102 mmol/L (ref 98–111)
Creatinine, Ser: 0.95 mg/dL (ref 0.44–1.00)
GFR, Estimated: >60 mL/min (ref >60)
Glucose, Bld: 142 mg/dL — ABNORMAL HIGH (ref 70–99)
Potassium: 4.2 mmol/L (ref 3.5–5.1)
Sodium: 134 mmol/L — ABNORMAL LOW (ref 135–145)

## 2022-04-02 LAB — BRAIN NATRIURETIC PEPTIDE: B Natriuretic Peptide: 16.5 pg/mL (ref 0.0–100.0)

## 2022-04-02 NOTE — Progress Notes (Signed)
ADVANCED HF CLINIC NOTE  Primary Care: Dr. Alcus Dad Primary HF Cardiologist: Dr. Aundra Dubin  HPI: 46 y.o. female w/ h/o systolic HF/nonischemic CM, obesity, ADHD, anxiety, depression, hypothyroidism, GERD, PACs, DM2.  Presented to ED on 02/13/21 w/ complaints of 3 week h/o progressive DOE, LEE and abdominal fullness. Found to be in acute CHF. BNP 1388. CXR w/ mild congestive CHF w/ interstitial edema. EKG showed ST 120 bpm. HS trop 48>>50.  TSH 13.416. Free T4 WNL. Chest CT negative for PE. She was admitted for CHF and started on IV Lasix. Echo showed biventricular dysfunction. LVEF severely reduced 20-25% w/ global HK. Mild LVH. RV moderately reduced w/ estimated RVSP 36 mmHg. Also found to have new diagnosis of T2D, Hgb A1c 11. She responded well to diuresis and was transitioned to oral Lasix and discharged on GDMT.  Seen in ED on 06/13/21 with fatigue. Fluoxetine and metformin recently increased at PCP visit 09/16. Chest x-ray with evidence of mild interstitial edema. It was recommended to increase lasix to 80 mg daily for 3 days. Declined COVID test as had negative result at home. No labs or ECG done.    Echo in 10/22 showed EF 40-45%, mild LV enlargement, normal RV, normal IVC.   Follow up 6/23, stable NYHA II and euvolemic using Lasix 40 mg PRN. Weight 263 lbs.  Today she returns for an acute visit with weight gain of 6 lbs in 10-14 days. Started Lasix a week and a half ago, but weight remained unchanged. She was drinking a lot of fluid around this time. Under a lot of work stress. Overall feeling fine. Started urinating more briskly the last few days and weight now back down. She is not SOB walking on flat ground or with work activities. Denies palpitations, CP, dizziness, edema, or PND/Orthopnea. Appetite ok. No fever or chills. Weight at home 260 pounds. Taking all medications.   ReDs: 32%  Labs (9/22): TSH mildly elevated, K 4, creatinine 1.07, LFTs normal Labs (12/22): K 3.5,  creatinine 0.86 Labs (3/23): K 4.0, creatinine 0.89 Labs (6/23): K 4.5, creatinine 0.76, LDL 137, HDL 29, TG 330  PMH: 1. Type 2 diabetes 2. ADHD 3. HTN 4. Depression 5. GERD 6. Obesity 7. Chronic systolic CHF:  Nonischemic cardiomyopathy.  - Echo (5/22) with EF 20-25%, global HK, moderately decreased RV systolic function.  - RHC/LHC: No significant CAD; mean RA 6, PA 53/20 mean 30, mean PCWP 20, CI (Fick) 2.74, PVR 1.7 WU.  - Cardiac MRI (6/22): Mildly dilated LV with EF 21%, RV EF 30%, no definite LGE (difficult images).  - Echo (10/22) with EF 40-45%, mild LV enlargement, normal RV.  8. Hypothyroidism 9. Atrial tachycardia 10. Hyperlipidemia.   ROS: All systems reviewed and negative except as per HPI.   FH: No family history of cardiomyopathy.   Current Outpatient Medications  Medication Sig Dispense Refill   acetaminophen (TYLENOL) 325 MG tablet Take 2 tablets (650 mg total) by mouth every 6 (six) hours as needed for mild pain.     empagliflozin (JARDIANCE) 10 MG TABS tablet Take 1 tablet (10 mg total) by mouth daily. 90 tablet 1   FLUoxetine (PROZAC) 40 MG capsule Take 1 capsule (40 mg total) by mouth daily. 90 capsule 1   furosemide (LASIX) 40 MG tablet Take 40 mg by mouth as needed.     metFORMIN (GLUCOPHAGE-XR) 500 MG 24 hr tablet Take 1 tablet (500 mg total) by mouth 2 (two) times daily. 180 tablet 2  metoprolol succinate (TOPROL-XL) 50 MG 24 hr tablet Take 1 tablet (50 mg total) by mouth daily. HF Fund 90 tablet 3   potassium chloride SA (KLOR-CON M) 20 MEQ tablet Take 1 tablet (20 mEq total) by mouth daily. As needed when lasix dose is taken    HF Fund 30 tablet 3   sacubitril-valsartan (ENTRESTO) 97-103 MG Take 1 tablet by mouth 2 (two) times daily. 180 tablet 1   spironolactone (ALDACTONE) 25 MG tablet Take 1 tablet (25 mg total) by mouth daily. HF fund 60 tablet 6   No current facility-administered medications for this encounter.   Allergies  Allergen Reactions    Sulfa Antibiotics    Social History   Socioeconomic History   Marital status: Married    Spouse name: Not on file   Number of children: Not on file   Years of education: Not on file   Highest education level: Not on file  Occupational History   Not on file  Tobacco Use   Smoking status: Former    Types: Cigarettes    Quit date: 01/12/2010    Years since quitting: 12.2   Smokeless tobacco: Never  Vaping Use   Vaping Use: Never used  Substance and Sexual Activity   Alcohol use: No   Drug use: No   Sexual activity: Not on file  Other Topics Concern   Not on file  Social History Narrative   Marital status: divorced and engaged since 2011.     Children: 2 children      Lives with fiance and two children (49, 10)      Employment: childcare early development; 3s and 4s. Surveyor, quantity      Tobacco: none      Alcohol: none      Drugs; None      Exercise: sporadic.           Social Determinants of Health   Financial Resource Strain: Medium Risk (02/15/2021)   Overall Financial Resource Strain (CARDIA)    Difficulty of Paying Living Expenses: Somewhat hard  Food Insecurity: No Food Insecurity (03/02/2021)   Hunger Vital Sign    Worried About Running Out of Food in the Last Year: Never true    Ran Out of Food in the Last Year: Never true  Transportation Needs: No Transportation Needs (02/15/2021)   PRAPARE - Hydrologist (Medical): No    Lack of Transportation (Non-Medical): No  Physical Activity: Not on file  Stress: Not on file  Social Connections: Not on file  Intimate Partner Violence: Not on file   BP 100/60   Pulse 78   Wt 119 kg (262 lb 6.4 oz)   LMP 03/12/2022 (Approximate)   SpO2 93%   BMI 51.25 kg/m   Wt Readings from Last 3 Encounters:  04/02/22 119 kg (262 lb 6.4 oz)  03/30/22 120.7 kg (266 lb)  03/02/22 119.4 kg (263 lb 3.2 oz)   PHYSICAL EXAM: General:  NAD. No resp difficulty HEENT: Normal Neck: Supple. No JVD.  Carotids 2+ bilat; no bruits. No lymphadenopathy or thryomegaly appreciated. Cor: PMI nondisplaced. Regular rate & rhythm. No rubs, gallops or murmurs. Lungs: Clear Abdomen: Obese, soft, nontender, nondistended. No hepatosplenomegaly. No bruits or masses. Good bowel sounds. Extremities: No cyanosis, clubbing, rash, edema Neuro: Alert & oriented x 3, cranial nerves grossly intact. Moves all 4 extremities w/o difficulty. Affect pleasant.   ASSESSMENT & PLAN:  1. Chronic systolic CHF:  New onset cardiomyopathy  5/22, nonischemic cardiomyopathy. Echo in 5/22 with EF 20-25%, moderately decreased RV systolic function.  LHC/RHC in 6/22 with preserved cardiac output, no significant coronary disease (nonischemic cardiomyopathy). Cardiac MRI with LV EF 21%, RV EF 30%, no myocardial LGE.  No ETOH, no drugs, no FH of CAD or CMP. Consider prior viral myocarditis, effects of diabetes itself, or newly-diagnosed hypothyroidism.  However, patient also had frequent episodes of rapid atrial tachycardia in the hospital and said that her HR had often been up to 150 bpm at home on her Apple Watch. Concern this may be tachycardia-mediated CMP.  Echo in 11/22 showed EF up to 40-45%, normal RV.  NYHA class II symptoms, not volume overloaded on exam, weight back down with PRN Lasix use. - Continue Lasix 40 mg PRN. We discussed parameters for use today. - Continue Entresto 97/103 mg bid. BMET/BNP today. - Continue spironolactone 25 mg daily.  - Continue Toprol XL 50 mg daily (did not tolerate 100 mg due to dizziness and fatigue).  - Continue Jardiance 10 mg daily. - EF is out of ICD range on last echo. Update echo next visit. 2. Type 2 diabetes: Per PCP.  3. Hypothyroidism: Subclinical, mild TSH elevation.  4. Hyperlipidemia: Unable to tolerate statin.  - LDL 137, TGs 330. She has been referred to [facility] 5. Atrial tachycardia: Frequent episodes noted during 5/22-6/22 admission, recurred when amiodarone stopped.   On questioning, her HR had frequently been elevated as high as 150s at home.  I am concerned that she could have a tachy-mediated CMP from AT with improvement in EF after suppression.  She has stopped amiodarone. She rarely has palpitations and elevated HR at this point.   - She will stay off amiodarone for now.  - EP saw her, they thought that her high BMI makes ablation less likely to be successful.  Would consider in future if she can lose weight.  6. Obesity: She is working on weight loss through dietary changes and increased activity. Body mass index is 51.25 kg/m.  I think part of her recent weight gain was caloric. Weight down 1 lb. - She has failed semaglutide x 2 due to GI side effects. 7. Snores: She has been referred for in-hospital sleep study (insurance will not cover home SS).   Follow up in 3 months Dr. Aundra Dubin, will update echo at this visit.   Maricela Bo [facility] FNP-BC 04/02/2022

## 2022-04-02 NOTE — Progress Notes (Signed)
ReDS Vest / Clip - 04/02/22 1100       ReDS Vest / Clip   Station Marker A    Ruler Value 35    ReDS Value Range Low volume    ReDS Actual Value 32    Anatomical Comments sitting

## 2022-04-02 NOTE — Patient Instructions (Addendum)
Thank you for coming in today  Labs were done today, if any labs are abnormal the clinic will call you No news is good news  Your physician recommends that you schedule a follow-up appointment in:  Keep follow up appointment     Do the following things EVERYDAY: Weigh yourself in the morning before breakfast. Write it down and keep it in a log. Take your medicines as prescribed Eat low salt foods--Limit salt (sodium) to 2000 mg per day.  Stay as active as you can everyday Limit all fluids for the day to less than 2 liters  At the [facility], you and your health needs are our priority. As part of our continuing mission to provide you with exceptional heart care, we have created designated Provider Care Teams. These Care Teams include your primary Cardiologist (physician) and Advanced Practice Providers (APPs- Physician Assistants and Nurse Practitioners) who all work together to provide you with the care you need, when you need it.   You may see any of the following providers on your designated Care Team at your next follow up: Dr Glori Bickers Dr Haynes Kerns, NP Lyda Jester, [place] [facility] [place], [place] Audry Riles, PharmD   Please be sure to bring in all your medications bottles to every appointment.   If you have any questions or concerns before your next appointment please send [place] a message through [place] or call our office at [phone].    TO LEAVE A MESSAGE FOR THE NURSE SELECT OPTION 2, PLEASE LEAVE A MESSAGE INCLUDING: YOUR NAME DATE OF BIRTH CALL BACK NUMBER REASON FOR CALL**this is important as we prioritize the call backs  YOU WILL RECEIVE A CALL BACK THE SAME DAY AS LONG AS YOU CALL BEFORE 4:00 PM

## 2022-04-05 LAB — CYTOLOGY - PAP
Comment: NEGATIVE
Diagnosis: NEGATIVE
High risk HPV: NEGATIVE

## 2022-04-30 ENCOUNTER — Other Ambulatory Visit (HOSPITAL_COMMUNITY): Payer: Self-pay

## 2022-05-15 ENCOUNTER — Encounter (HOSPITAL_COMMUNITY): Payer: Self-pay

## 2022-05-15 ENCOUNTER — Other Ambulatory Visit: Payer: Self-pay

## 2022-05-15 ENCOUNTER — Emergency Department (HOSPITAL_COMMUNITY)
Admission: EM | Admit: 2022-05-15 | Discharge: 2022-05-15 | Disposition: A | Discharge status: Home / Self Care | Payer: Medicaid Other | Attending: Emergency Medicine | Admitting: Emergency Medicine

## 2022-05-15 DIAGNOSIS — I509 Heart failure, unspecified: Secondary | ICD-10-CM | POA: Diagnosis not present

## 2022-05-15 DIAGNOSIS — R002 Palpitations: Secondary | ICD-10-CM

## 2022-05-15 DIAGNOSIS — I471 Supraventricular tachycardia: Secondary | ICD-10-CM | POA: Diagnosis not present

## 2022-05-15 DIAGNOSIS — I110 Hypertensive heart disease with heart failure: Secondary | ICD-10-CM | POA: Insufficient documentation

## 2022-05-15 DIAGNOSIS — R000 Tachycardia, unspecified: Secondary | ICD-10-CM

## 2022-05-15 DIAGNOSIS — E039 Hypothyroidism, unspecified: Secondary | ICD-10-CM | POA: Insufficient documentation

## 2022-05-15 DIAGNOSIS — N9489 Other specified conditions associated with female genital organs and menstrual cycle: Secondary | ICD-10-CM | POA: Diagnosis not present

## 2022-05-15 DIAGNOSIS — E119 Type 2 diabetes mellitus without complications: Secondary | ICD-10-CM | POA: Insufficient documentation

## 2022-05-15 HISTORY — DX: Supraventricular tachycardia, unspecified: I47.10

## 2022-05-15 LAB — CBC WITH DIFFERENTIAL/PLATELET
Abs Immature Granulocytes: 0.06 10*3/uL (ref 0.00–0.07)
Basophils Absolute: 0.1 10*3/uL (ref 0.0–0.1)
Basophils Relative: 1 %
Eosinophils Absolute: 0.3 10*3/uL (ref 0.0–0.5)
Eosinophils Relative: 2 %
HCT: 47 % — ABNORMAL HIGH (ref 36.0–46.0)
Hemoglobin: 14.9 g/dL (ref 12.0–15.0)
Immature Granulocytes: 0 %
Lymphocytes Relative: 30 %
Lymphs Abs: 4.1 10*3/uL — ABNORMAL HIGH (ref 0.7–4.0)
MCH: 27.4 pg (ref 26.0–34.0)
MCHC: 31.7 g/dL (ref 30.0–36.0)
MCV: 86.4 fL (ref 80.0–100.0)
Monocytes Absolute: 1.3 10*3/uL — ABNORMAL HIGH (ref 0.1–1.0)
Monocytes Relative: 9 %
Neutro Abs: 7.9 10*3/uL — ABNORMAL HIGH (ref 1.7–7.7)
Neutrophils Relative %: 58 %
Platelets: 250 10*3/uL (ref 150–400)
RBC: 5.44 MIL/uL — ABNORMAL HIGH (ref 3.87–5.11)
RDW: 14.6 % (ref 11.5–15.5)
WBC: 13.6 10*3/uL — ABNORMAL HIGH (ref 4.0–10.5)
nRBC: 0 % (ref 0.0–0.2)

## 2022-05-15 LAB — COMPREHENSIVE METABOLIC PANEL
ALT: 18 U/L (ref 0–44)
AST: 20 U/L (ref 15–41)
Albumin: 3.6 g/dL (ref 3.5–5.0)
Alkaline Phosphatase: 82 U/L (ref 38–126)
Anion gap: 10 (ref 5–15)
BUN: 28 mg/dL — ABNORMAL HIGH (ref 6–20)
CO2: 20 mmol/L — ABNORMAL LOW (ref 22–32)
Calcium: 9.6 mg/dL (ref 8.9–10.3)
Chloride: 109 mmol/L (ref 98–111)
Creatinine, Ser: 0.96 mg/dL (ref 0.44–1.00)
GFR, Estimated: >60 mL/min (ref >60)
Glucose, Bld: 119 mg/dL — ABNORMAL HIGH (ref 70–99)
Potassium: 5 mmol/L (ref 3.5–5.1)
Sodium: 139 mmol/L (ref 135–145)
Total Bilirubin: 0.6 mg/dL (ref 0.3–1.2)
Total Protein: 6.1 g/dL — ABNORMAL LOW (ref 6.5–8.1)

## 2022-05-15 LAB — MAGNESIUM: Magnesium: 2.1 mg/dL (ref 1.7–2.4)

## 2022-05-15 LAB — HCG, SERUM, QUALITATIVE: Preg, Serum: NEGATIVE

## 2022-05-15 LAB — TSH: TSH: 0.409 u[IU]/mL (ref 0.350–4.500)

## 2022-05-15 MED ORDER — METOPROLOL TARTRATE 25 MG PO TABS: ORAL_TABLET | ORAL | 1 refills | Status: AC

## 2022-05-15 MED ORDER — SODIUM CHLORIDE 0.9 % IV BOLUS
500.0000 mL | Freq: Once | INTRAVENOUS | Status: AC
  Administered 2022-05-15: 500 mL via INTRAVENOUS

## 2022-05-15 MED ORDER — METOPROLOL SUCCINATE ER 50 MG PO TB24: ORAL_TABLET | ORAL | 3 refills | Status: DC

## 2022-05-15 NOTE — Discharge Instructions (Signed)
Your history, exam, work-up today revealed intermittent atrial tachycardia causing your palpitations and fast heart rate episodes.  We were able to slow them down with the vagal maneuvers but they kept speeding up.  After some fluids and labs, your heart rate remained more stable and slow down.  Cardiology came to the bedside and made the recommendations.  Please follow on those recommendations and follow-up with your primary doctor and cardiology team.  Please rest and stay hydrated.  If any symptoms change or worsen acutely, please return to the nearest emergency department.

## 2022-05-15 NOTE — ED Notes (Signed)
MD at bedside. 

## 2022-05-15 NOTE — ED Notes (Signed)
RN aware of HR

## 2022-05-15 NOTE — ED Provider Notes (Signed)
[facility] EMERGENCY DEPARTMENT Provider Note   CSN: [id number] Arrival date & time: 05/15/22  1824     History {Add pertinent medical, surgical, social history, OB history to HPI:1} Chief Complaint  Patient presents with   Tachycardia    Vickie Graham is a 46 y.o. female.  HPI     Home Medications Prior to Admission medications   Medication Sig Start Date End Date Taking? Authorizing Provider  acetaminophen (TYLENOL) 325 MG tablet Take 2 tablets (650 mg total) by mouth every 6 (six) hours as needed for mild pain. 02/20/21   Autry-Lott, Naaman Plummer, DO  empagliflozin (JARDIANCE) 10 MG TABS tablet Take 1 tablet (10 mg total) by mouth daily. 03/02/22   [place], Maricela Bo, FNP  FLUoxetine (PROZAC) 40 MG capsule Take 1 capsule (40 mg total) by mouth daily. 03/30/22   Alcus Dad, MD  furosemide (LASIX) 40 MG tablet Take 40 mg by mouth as needed.    [provider]  metFORMIN (GLUCOPHAGE-XR) 500 MG 24 hr tablet Take 1 tablet (500 mg total) by mouth 2 (two) times daily. 03/30/22   Alcus Dad, MD  metoprolol succinate (TOPROL-XL) 50 MG 24 hr tablet Take 1 tablet (50 mg total) by mouth daily. HF Fund 03/02/22   [place], Maricela Bo, FNP  potassium chloride SA (KLOR-CON M) 20 MEQ tablet Take 1 tablet (20 mEq total) by mouth daily. As needed when lasix dose is taken    HF Fund 03/02/22   [place], Maricela Bo, FNP  sacubitril-valsartan (ENTRESTO) 97-103 MG Take 1 tablet by mouth 2 (two) times daily. 03/02/22   Rafael Bihari, FNP  spironolactone (ALDACTONE) 25 MG tablet Take 1 tablet (25 mg total) by mouth daily. HF fund 03/02/22   Rafael Bihari, FNP      Allergies    Sulfa antibiotics    Review of Systems   Review of Systems  Physical Exam Updated Vital Signs BP 126/88   Pulse (!) 168   Temp (!) 97.3 F (36.3 C) (Oral)   Resp 18   Ht 5' (1.524 m)   Wt 118.8 kg   SpO2 94%   BMI 51.17 kg/m  Physical Exam  ED Results / Procedures / Treatments    Labs (all labs ordered are listed, but only abnormal results are displayed) Labs Reviewed  CBC WITH DIFFERENTIAL/PLATELET - Abnormal; Notable for the following components:      Result Value   WBC 13.6 (*)    RBC 5.44 (*)    HCT 47.0 (*)    Neutro Abs 7.9 (*)    Lymphs Abs 4.1 (*)    Monocytes Absolute 1.3 (*)    All other components within normal limits  COMPREHENSIVE METABOLIC PANEL - Abnormal; Notable for the following components:   CO2 20 (*)    Glucose, Bld 119 (*)    BUN 28 (*)    Total Protein 6.1 (*)    All other components within normal limits  TSH  MAGNESIUM  HCG, SERUM, QUALITATIVE  I-STAT BETA HCG BLOOD, ED ([facility], [facility], [facility] ONLY)    EKG EKG Interpretation  Date/Time:  Tuesday May 15 2022 18:34:20 EDT Ventricular Rate:  152 PR Interval:    QRS Duration: 82 QT Interval:  318 QTC Calculation: 505 R Axis:   97 Text Interpretation: Supraventricular tachycardia Rightward axis Low voltage QRS Borderline ECG When compared with ECG of 30-Nov-2021 11:07, PREVIOUS ECG IS PRESENT when compared to prior, now appears in SVT> No STEMI Confirmed by  Dyneisha Murchison, Gerald Stabs [phone]) on 05/15/2022 6:45:25 PM  Radiology No results found.  Procedures Procedures  {Document cardiac monitor, telemetry assessment procedure when appropriate:1}  Medications Ordered in ED Medications  sodium chloride 0.9 % bolus 500 mL (0 mLs Intravenous Stopped 05/15/22 2006)    ED Course/ Medical Decision Making/ A&P                           Medical Decision Making Amount and/or Complexity of Data Reviewed Labs: ordered.    Vickie Graham is a 46 y.o. female with a past medical history significant for CHF, atrial tachycardia, previous SVT, diabetes, hypertension, anxiety, depression, and hypothyroidism who presents with palpitations and sensation of tachycardia.  Patient reports that this morning at 11 AM she felt that her heart rate started going faster and with palpitations.  She has had this happen  in the past.  She reports that she took her beta-blocker this morning and other medications but was more stressed than normal at work.  She says that this is happened in the setting of stress before.  She reports some fatigue but no lightheadedness or syncope.  She denies any current chest pain or shortness of breath.  Denies nausea, vomiting, diaphoresis, constipation, diarrhea, or urinary changes.  No reported leg pain or leg swelling and does not feel fluid overloaded.  On my exam, lungs were clear and chest was nontender.  Abdomen nontender.  Back nontender.  Good pulses in extremities.  Patient resting comfortably but does have heart rate going between 150 and 170s.  Initial EKG shows what appears to be SVT.  Vagal maneuver was tried 2 times and it appeared to successfully send her into a sinus rhythm but after about 20 seconds her heart rate began to speed up and went back into SVT.  Unclear if this is SVT versus atrial tachycardia.  We will give her a small amount of fluids and she does not appear clinically fluid overloaded and will also check electrolytes.  We will also call cardiology as given the conversion to sinus rhythm and then going straight back to the tachycardia.  Have low suspicion that adenosine would be needed at this time as vagal movers were successful briefly.  Work-up returned overall reassuring.  Patient has a mild leukocytosis but normal hemoglobin.  Doubt infection based on her description of symptoms.  Magnesium normal.  Potassium normal.  She is not pregnant.  TSH normal.  Cardiology came to the bedside after they were called and made some changes to her medications.  They suspect atrial tachycardia.  Patient's heart rate has slowed down to the 80s and she has not had any further episodes of the significant tachycardia.  They will have her follow-up in clinic with the electrophysiology team and discuss some changes to the metoprolol.  Patient agreed to plan of care and will  stay hydrated.  She no other questions or concerns and was discharged in good condition with resolution of severe tachycardia.   {Document critical care time when appropriate:1} {Document review of labs and clinical decision tools ie heart score, Chads2Vasc2 etc:1}  {Document your independent review of radiology images, and any outside records:1} {Document your discussion with family members, caretakers, and with consultants:1} {Document social determinants of health affecting pt's care:1} {Document your decision making why or why not admission, treatments were needed:1} Final Clinical Impression(s) / ED Diagnoses Final diagnoses:  Atrial tachycardia ([place])  Palpitations  Tachycardia  Rx / DC Orders ED Discharge Orders          Ordered    Ambulatory referral to Cardiac Electrophysiology       Comments: Management Recurrent Atrial Tachycardia, c/f tachycardia induced cardiomyopathy diagnosed 01/2021, ED visit for Atrial tachycardia 05/15/2022. Prefer DR Will Camnitz if possible, if not available in next 2 months then first available EP.   05/15/22 2225    metoprolol succinate (TOPROL-XL) 50 MG 24 hr tablet  Multiple Frequencies       Note to Pharmacy: Please cancel all previous orders for current medication. Change in dosage or pill size.   05/15/22 2236    metoprolol tartrate (LOPRESSOR) 25 MG tablet       Note to Pharmacy: "Pill in pocket" approach to treat acute episodes of SVT, this is in addition to patients home metoprolol succinate   05/15/22 2236            Clinical Impression: 1. Atrial tachycardia (HCC)   2. Palpitations   3. Tachycardia     Disposition: Discharge  Condition: Good  I have discussed the results, Dx and Tx plan with the pt(& family if present). He/she/they expressed understanding and agree(s) with the plan. Discharge instructions discussed at great length. Strict return precautions discussed and pt &/or family have verbalized understanding of the  instructions. No further questions at time of discharge.    New Prescriptions   METOPROLOL TARTRATE (LOPRESSOR) 25 MG TABLET    Take 1 tablet when you have palpitations    Follow Up: your cardiologist

## 2022-05-15 NOTE — Consult Note (Addendum)
Cardiology Consultation:   Patient ID: Vickie Graham MRN: [id number]; DOB: 08-18-1976  Admit date: 05/15/2022 Date of Consult: 05/15/2022  Primary Care Provider: Alcus Dad, MD [facility] Cardiologist: Dr Loralie Champagne [facility] Electrophysiologist:  None   Patient Profile:   Vickie Graham is a 46 y.o. female with a hx of NICM HFrEF (EF 40-45%), NID DM2 (a1c 6.1%), hypothyroidism, obesity (BMI 34), ADHD, anxiety, depression, GERD, PACs, mixed HLD hx of atrial tachycardia who is being seen today for the evaluation of SVT at the request of Dr Harrell Gave Tegeler.  History of Present Illness:   Vickie Graham is a 46 y.o. female with a hx of NICM HFrEF (EF 40-45%), NID DM2 (a1c 6.1%), hypothyroidism, obesity (BMI 52), ADHD, anxiety, depression, GERD, PACs, mixed HLD hx of atrial tachycardia who is being seen today for the evaluation of SVT at the request of Dr Harrell Gave Tegeler.  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  These were associated with jitteriness, sweating, and fatigue.  Her palpitations continued until 4 PM, at which point she checked on her [employer] watch, which revealed a heart rate of 171 bpm.  She finished work at 6 PM, and given continued palpitations, she proceeded to the emergency department given extended duration without cessation.  She notes that she has episodes of palpitations occurring every 2-3 weeks, typically happening at work, and lasting 1-3 hours in duration.  Her palpitation episodes typically resolve with sitting down, relaxing, deep breathing, and "doing nothing".  She has been compliant with her medications including metoprolol.  On arrival to the emergency department, found to have elevated heart rates in the 170s.  Vagal maneuvers were performed, initially successful with heart rate dropping to 100s.  Patient labs with normal complete metabolic panel, unremarkable CBC, normal TSH  (0.409).  Telemetry showed maintenance of sinus rhythm following that (although histogram falsely shows elevated heart rates intermittently, double counting T waves).  Regarding her cardiac history, first diagnosed with CHF and type 2 diabetes May 2022 with index acute HFrEF exacerbation.  During this admission, had frequent episodes of atrial tachycardia initially treated with amiodarone, that recurred when amiodarone was stopped.  There is concern that she could have had tachycardia mediated cardiomyopathy given that on questioning she noted frequent elevated heart rates as high as 150s at home.  She has seen EP in consultation during admission on 02/17/2021 regarding ablation, but this was deferred due to weight, and they stated would consider in future if she can lose weight to improve safety and success rate. Currently had improvement in her ejection fraction on GDMT from 20% to 40-50%.   She was last seen in the outpatient setting by Dr. Loralie Champagne on 11/30/2021.  Past Medical History:  Diagnosis Date   ADHD (attention deficit hyperactivity disorder)    diagnosed at [facility] clinic.   Allergy    Anxiety    Social anxiety, generalized anxiety disorder.   Depression    Diabetes mellitus without complication ([place])    HFrEF (heart failure with reduced ejection fraction) (HCC)    Hypertension    SVT (supraventricular tachycardia) ([place])     Past Surgical History:  Procedure Laterality Date   RIGHT/LEFT HEART CATH AND CORONARY ANGIOGRAPHY N/A 02/16/2021   Procedure: RIGHT/LEFT HEART CATH AND CORONARY ANGIOGRAPHY;  Surgeon: Larey Dresser, MD;  Location: [place] [facility];  Service: Cardiovascular;  Laterality: N/A;     Home Medications:  Prior to Admission medications   Medication Sig Start Date End Date Taking? Authorizing Provider  metoprolol tartrate (LOPRESSOR) 25 MG tablet Take 1 tablet when you have palpitations 05/15/22  Yes Bronson Curb, MD  acetaminophen (TYLENOL) 325 MG  tablet Take 2 tablets (650 mg total) by mouth every 6 (six) hours as needed for mild pain. 02/20/21   Autry-Lott, Naaman Plummer, DO  empagliflozin (JARDIANCE) 10 MG TABS tablet Take 1 tablet (10 mg total) by mouth daily. 03/02/22   Milford, Maricela Bo, FNP  FLUoxetine (PROZAC) 40 MG capsule Take 1 capsule (40 mg total) by mouth daily. 03/30/22   Alcus Dad, MD  furosemide (LASIX) 40 MG tablet Take 40 mg by mouth as needed.    [provider]  metFORMIN (GLUCOPHAGE-XR) 500 MG 24 hr tablet Take 1 tablet (500 mg total) by mouth 2 (two) times daily. 03/30/22   Alcus Dad, MD  metoprolol succinate (TOPROL-XL) 50 MG 24 hr tablet Take 1 tablet (50 mg total) by mouth daily AND 0.5 tablets (25 mg total) at bedtime. HF Fund. 05/15/22   Bronson Curb, MD  potassium chloride SA (KLOR-CON M) 20 MEQ tablet Take 1 tablet (20 mEq total) by mouth daily. As needed when lasix dose is taken    HF Fund 03/02/22   Milford, Maricela Bo, FNP  sacubitril-valsartan (ENTRESTO) 97-103 MG Take 1 tablet by mouth 2 (two) times daily. 03/02/22   Rafael Bihari, FNP  spironolactone (ALDACTONE) 25 MG tablet Take 1 tablet (25 mg total) by mouth daily. HF fund 03/02/22   Rafael Bihari, FNP    Inpatient Medications: Scheduled Meds:  Continuous Infusions:  PRN Meds:   Allergies:    Allergies  Allergen Reactions   Sulfa Antibiotics     Social History:   Social History   Socioeconomic History   Marital status: Married    Spouse name: Not on file   Number of children: Not on file   Years of education: Not on file   Highest education level: Not on file  Occupational History   Not on file  Tobacco Use   Smoking status: Former    Types: Cigarettes    Quit date: 01/12/2010    Years since quitting: 12.3   Smokeless tobacco: Never  Vaping Use   Vaping Use: Never used  Substance and Sexual Activity   Alcohol use: No   Drug use: No   Sexual activity: Not on file  Other Topics Concern   Not on file   Social History Narrative   Marital status: divorced and engaged since 2011.     Children: 2 children      Lives with fiance and two children (15, 10)      Employment: childcare early development; 3s and 4s. Surveyor, quantity      Tobacco: none      Alcohol: none      Drugs; None      Exercise: sporadic.           Social Determinants of Health   Financial Resource Strain: Medium Risk (02/15/2021)   Overall Financial Resource Strain (CARDIA)    Difficulty of Paying Living Expenses: Somewhat hard  Food Insecurity: No Food Insecurity (03/02/2021)   Hunger Vital Sign    Worried About Running Out of Food in the Last Year: Never true    Ran Out of Food in the Last Year: Never true  Transportation Needs: No Transportation Needs (02/15/2021)   PRAPARE - Transportation    Lack  of Transportation (Medical): No    Lack of Transportation (Non-Medical): No  Physical Activity: Not on file  Stress: Not on file  Social Connections: Not on file  Intimate Partner Violence: Not on file    Family History:   doesn't talk much with family, only knows that parents both living, no heart disease, now both in their 54's; 3 brothers; children 25, 76 both healthy  ROS:  Review of Systems: [y] = yes, '[ ]'$  = no      General: Weight gain '[ ]'$ ; Weight loss [ y]; Anorexia '[ ]'$ ; Fatigue Blue.Reese ]; Fever '[ ]'$ ; Chills '[ ]'$ ; Weakness '[ ]'$    Cardiac: Chest pain/pressure '[ ]'$ ; Resting SOB '[ ]'$ ; Exertional SOB '[ ]'$ ; Orthopnea '[ ]'$ ; Pedal Edema Blue.Reese ]; Palpitations '[ ]'$ ; Syncope '[ ]'$ ; Presyncope '[ ]'$ ; Paroxysmal nocturnal dyspnea '[ ]'$    Pulmonary: Cough '[ ]'$ ; Wheezing '[ ]'$ ; Hemoptysis '[ ]'$ ; Sputum '[ ]'$ ; Snoring '[ ]'$    GI: Vomiting '[ ]'$ ; Dysphagia '[ ]'$ ; Melena '[ ]'$ ; Hematochezia '[ ]'$ ; Heartburn '[ ]'$ ; Abdominal pain '[ ]'$ ; Constipation '[ ]'$ ; Diarrhea '[ ]'$ ; BRBPR '[ ]'$    GU: Hematuria '[ ]'$ ; Dysuria '[ ]'$ ; Nocturia '[ ]'$  Vascular: Pain in legs with walking '[ ]'$ ; Pain in feet with lying flat '[ ]'$ ; Non-healing sores '[ ]'$ ; Stroke '[ ]'$ ; TIA '[ ]'$ ; Slurred speech '[ ]'$ ;    Neuro: Headaches '[ ]'$ ; Vertigo '[ ]'$ ; Seizures '[ ]'$ ; Paresthesias '[ ]'$ ;Blurred vision '[ ]'$ ; Diplopia '[ ]'$ ; Vision changes '[ ]'$    Ortho/Skin: Arthritis '[ ]'$ ; Joint pain '[ ]'$ ; Muscle pain '[ ]'$ ; Joint swelling '[ ]'$ ; Back Pain '[ ]'$ ; Rash '[ ]'$   Sweating [ y] Psych: Depression '[ ]'$ ; Anxiety '[ ]'$    Heme: Bleeding problems '[ ]'$ ; Clotting disorders '[ ]'$ ; Anemia '[ ]'$    Endocrine: Diabetes [x ]; Thyroid dysfunction '[ ]'$    Physical Exam/Data:   Vitals:   05/15/22 2130 05/15/22 2145 05/15/22 2200 05/15/22 2230  BP: 115/83 111/74 108/77 110/69  Pulse: 100 81 100 93  Resp: '18  17 17  '$ Temp:      TempSrc:      SpO2: 96% 96% 97% 95%  Weight:      Height:        Intake/Output Summary (Last 24 hours) at 05/15/2022 2242 Last data filed at 05/15/2022 2006 Gross per 24 hour  Intake 500 ml  Output --  Net 500 ml      05/15/2022    6:35 PM 04/02/2022   11:50 AM 03/30/2022    1:30 PM  Last 3 Weights  Weight (lbs) 262 lb 262 lb 6.4 oz 266 lb  Weight (kg) 118.842 kg 119.024 kg 120.657 kg     Body mass index is 51.17 kg/m.  General:  Well nourished, well developed, in no acute distress HEENT: normal Lymph: no adenopathy Neck: no JVD Endocrine:  No thryomegaly Vascular: No carotid bruits; FA pulses 2+ bilaterally without bruits  Cardiac:  normal S1, S2; RRR; no murmur  Lungs:  clear to auscultation bilaterally, no wheezing, rhonchi or rales  Abd: soft, nontender, no hepatomegaly  Ext: no edema Musculoskeletal:  No deformities, BUE and BLE strength normal and equal Skin: warm and dry  Neuro:  CNs 2-12 intact, no focal abnormalities noted Psych:  Normal affect    Telemetry:  Telemetry was personally reviewed and demonstrates: SVT on initial presentation at 6:45 PM with heart rate in 170s with long RP interval, eventual break of  SVT to normal sinus rhythm at 8:10 PM ( during vagal maneuvers)  EKG:  The ECG that was done 8/29 at 6:34 PM was personally reviewed and demonstrates SVT with ventricular rate 154 bpm,  normal axis, normal intervals, long RP tachycardia   Relevant CV Studies: TTE (06/26/21): IMPRESSIONS   1. Diffuse hypokinesis worse in the septum and apex EF improved since TTE  done 02/14/21. Left ventricular ejection fraction, by estimation, is 40 to  45%. The left ventricle has mildly decreased function. The left ventricle  demonstrates global  hypokinesis. The left ventricular internal cavity size was mildly dilated.  Left ventricular diastolic parameters were normal.   2. Right ventricular systolic function is normal. The right ventricular  size is normal.   3. Left atrial size was mildly dilated.   4. The mitral valve is normal in structure. Trivial mitral valve  regurgitation. No evidence of mitral stenosis.   5. The aortic valve was not well visualized. Aortic valve regurgitation  is not visualized. No aortic stenosis is present.   6. The inferior vena cava is normal in size with greater than 50%  respiratory variability, suggesting right atrial pressure of 3 mmHg.   Laboratory Data:  High Sensitivity Troponin:  No results for input(s): "TROPONINIHS" in the last 720 hours.   Chemistry Recent Labs  Lab 05/15/22 1913  NA 139  K 5.0  CL 109  CO2 20*  GLUCOSE 119*  BUN 28*  CREATININE 0.96  CALCIUM 9.6  GFRNONAA >60  ANIONGAP 10    Recent Labs  Lab 05/15/22 1913  PROT 6.1*  ALBUMIN 3.6  AST 20  ALT 18  ALKPHOS 82  BILITOT 0.6   Hematology Recent Labs  Lab 05/15/22 1913  WBC 13.6*  RBC 5.44*  HGB 14.9  HCT 47.0*  MCV 86.4  MCH 27.4  MCHC 31.7  RDW 14.6  PLT 250   BNPNo results for input(s): "BNP", "PROBNP" in the last 168 hours.  DDimer No results for input(s): "DDIMER" in the last 168 hours.  Radiology/Studies:  No results found.   Assessment and Plan:   Vickie Graham is a 46 y.o. female with a hx of NICM HFrEF (EF 40-45%), NID DM2 (a1c 6.1%), hypothyroidism, obesity (BMI 52), ADHD, anxiety, depression, GERD, PACs, mixed HLD hx of atrial  tachycardia who is being seen today for the evaluation of SVT, likely recurrent atrial tachycardia  Symptomatic SVT, Most likely Atrial Tachycardia Presenting with 9-hour episode of SVT with heart rate 170.  EKG reveals long RP tachycardia, and given history of atrial tachycardia and June 2022, this is most likely the diagnosis.  Her atrial tachycardia was terminated in the ED via vagal maneuvers, in NSR since. She has recurrent episodes of SVT every 2-3 weeks in the outpatient setting and lasting 1-3 hours.  Her ischemic HFrEF is suspected to possibly be related to prolonged atrial tachycardia prior to 2022.  EP was historically consulted in June 2022 in an inpatient setting, and recommended weight loss prior to consideration of ablation; she has successfully lost some weight since that time.  Given no further atrial tachycardia since vagal maneuvers in the emergency department, she is safe to be discharged from the ED, with close cardiology and EP follow-up. -Increase metoprolol succinate 50 mg daily to 50 mg qAM + 25 mg qPM -Prescribed pill in pocket metoprolol tartrate 25 mg to be used once prn SVT outside of hospital -Provided instructions for vagal maneuvers to be used in the outpatient setting -  Ambulatory referral to electrophysiology for long-term management of recurrent symptomatic atrial tachycardia occurring every month -Strict return precautions provided for the patient if she has recurrent atrial tachycardia, for which admission might be considered -Okay for discharge from [place] will sign off.   Medication Recommendations:  as above in plan Other recommendations (labs, testing, etc):  none Follow up as an outpatient:  yes  For questions or updates, please contact [place] Please consult [email or web address] for contact info under     Signed, Bronson Curb, MD  05/15/2022 10:42 PM

## 2022-05-15 NOTE — ED Notes (Signed)
Patient ambulated to restroom.

## 2022-05-15 NOTE — ED Triage Notes (Signed)
Patient reports starting having palpitation around 11am and measured it around 4pm  Denies chest pain and sob.  But feels extremely anxious and jittery

## 2022-05-17 ENCOUNTER — Telehealth (HOSPITAL_COMMUNITY): Payer: Self-pay

## 2022-05-17 DIAGNOSIS — I5022 Chronic systolic (congestive) heart failure: Secondary | ICD-10-CM

## 2022-05-17 NOTE — Telephone Encounter (Signed)
Called to inform patient of referral to EP.

## 2022-06-04 ENCOUNTER — Emergency Department (HOSPITAL_COMMUNITY): Payer: No Typology Code available for payment source

## 2022-06-04 ENCOUNTER — Encounter (HOSPITAL_COMMUNITY): Payer: Self-pay | Admitting: Emergency Medicine

## 2022-06-04 ENCOUNTER — Other Ambulatory Visit: Payer: Self-pay

## 2022-06-04 ENCOUNTER — Emergency Department (HOSPITAL_COMMUNITY)
Admission: EM | Admit: 2022-06-04 | Discharge: 2022-06-05 | Disposition: A | Discharge status: Home / Self Care | Payer: No Typology Code available for payment source | Attending: Emergency Medicine | Admitting: Emergency Medicine

## 2022-06-04 ENCOUNTER — Emergency Department (HOSPITAL_COMMUNITY): Payer: Medicaid Other

## 2022-06-04 DIAGNOSIS — E119 Type 2 diabetes mellitus without complications: Secondary | ICD-10-CM | POA: Insufficient documentation

## 2022-06-04 DIAGNOSIS — Z7984 Long term (current) use of oral hypoglycemic drugs: Secondary | ICD-10-CM | POA: Diagnosis not present

## 2022-06-04 DIAGNOSIS — Z91040 Latex allergy status: Secondary | ICD-10-CM | POA: Insufficient documentation

## 2022-06-04 DIAGNOSIS — S52045A Nondisplaced fracture of coronoid process of left ulna, initial encounter for closed fracture: Secondary | ICD-10-CM | POA: Diagnosis not present

## 2022-06-04 DIAGNOSIS — W010XXA Fall on same level from slipping, tripping and stumbling without subsequent striking against object, initial encounter: Secondary | ICD-10-CM | POA: Insufficient documentation

## 2022-06-04 DIAGNOSIS — S53105A Unspecified dislocation of left ulnohumeral joint, initial encounter: Secondary | ICD-10-CM | POA: Diagnosis not present

## 2022-06-04 DIAGNOSIS — Y9301 Activity, walking, marching and hiking: Secondary | ICD-10-CM | POA: Insufficient documentation

## 2022-06-04 DIAGNOSIS — I509 Heart failure, unspecified: Secondary | ICD-10-CM | POA: Diagnosis not present

## 2022-06-04 DIAGNOSIS — S59912A Unspecified injury of left forearm, initial encounter: Secondary | ICD-10-CM | POA: Diagnosis present

## 2022-06-04 DIAGNOSIS — W19XXXA Unspecified fall, initial encounter: Secondary | ICD-10-CM

## 2022-06-04 MED ORDER — MORPHINE SULFATE (PF) 4 MG/ML IV SOLN
4.0000 mg | Freq: Once | INTRAVENOUS | Status: AC
  Administered 2022-06-04: 4 mg via INTRAVENOUS
  Filled 2022-06-04: qty 1

## 2022-06-04 MED ORDER — IBUPROFEN 600 MG PO TABS: 600.0000 mg | ORAL_TABLET | Freq: Four times a day (QID) | ORAL | 0 refills | Status: DC | PRN

## 2022-06-04 MED ORDER — OXYCODONE-ACETAMINOPHEN 5-325 MG PO TABS
1.0000 | ORAL_TABLET | Freq: Once | ORAL | Status: AC
  Administered 2022-06-05: 1 via ORAL
  Filled 2022-06-04: qty 1

## 2022-06-04 MED ORDER — PROPOFOL 10 MG/ML IV BOLUS
1.0000 mg/kg | Freq: Once | INTRAVENOUS | Status: DC
  Filled 2022-06-04: qty 20

## 2022-06-04 MED ORDER — PROPOFOL 10 MG/ML IV BOLUS
100.0000 mg | Freq: Once | INTRAVENOUS | Status: AC
  Administered 2022-06-04: 100 mg via INTRAVENOUS

## 2022-06-04 MED ORDER — OXYCODONE HCL 5 MG PO TABS: 5.0000 mg | ORAL_TABLET | Freq: Four times a day (QID) | ORAL | 0 refills | Status: DC | PRN

## 2022-06-04 NOTE — ED Provider Notes (Signed)
[facility]  Date/Time: 06/04/2022 11:52 PM  Performed by: Wyvonnia Dusky, MD Authorized by: Wyvonnia Dusky, MD   Consent:    Consent obtained:  Written   Consent given by:  Patient   Risks discussed:  Fracture, irreducible dislocation, nerve damage, recurrent dislocation, restricted joint movement, stiffness and vascular damageInjury location: elbow Location details: left elbow Injury type: fracture-dislocation Pre-procedure neurovascular assessment: neurovascularly intact Pre-procedure distal perfusion: normal Pre-procedure neurological function: normal Pre-procedure range of motion: reduced  Anesthesia: Local anesthesia used: no  Patient sedated: Yes. Refer to sedation procedure documentation for details of sedation. Manipulation performed: yes Skin traction used: yes Skeletal traction used: yes Reduction successful: yes X-ray confirmed reduction: yes Immobilization: splint Splint type: long leg Splint Applied by: Ortho Tech Supplies used: cotton padding Post-procedure neurovascular assessment: post-procedure neurovascularly intact Post-procedure distal perfusion: normal Post-procedure neurological function: normal Post-procedure range of motion: normal   .Sedation  Date/Time: 06/04/2022 11:53 PM  Performed by: Wyvonnia Dusky, MD Authorized by: Wyvonnia Dusky, MD   Consent:    Consent obtained:  Written   Consent given by:  Patient   Risks discussed:  Allergic reaction, dysrhythmia, vomiting, respiratory compromise necessitating ventilatory assistance and intubation, prolonged sedation necessitating reversal, prolonged hypoxia resulting in organ damage, inadequate sedation and nausea Universal protocol:    Procedure explained and questions answered to patient or proxy's satisfaction: yes     Relevant documents present and verified: yes     Test results available: yes     Imaging studies available: yes     Required blood products, implants,  devices, and special equipment available: yes     Site/side marked: yes     Immediately prior to procedure, a time out was called: yes     Patient identity confirmed:  Arm band and verbally with patient Indications:    Procedure performed:  Dislocation reduction   Procedure necessitating sedation performed by:  Physician performing sedation Pre-sedation assessment:    Time since last food or drink:  8 hours   ASA classification: class 2 - patient with mild systemic disease     Mallampati score:  II - soft palate, uvula, fauces visible   Pre-sedation assessments completed and reviewed: airway patency, cardiovascular function, hydration status, mental status, nausea/vomiting, pain level, respiratory function and temperature   Immediate pre-procedure details:    Reassessment: Patient reassessed immediately prior to procedure     Reviewed: vital signs, relevant labs/tests and NPO status     Verified: bag valve mask available, emergency equipment available, intubation equipment available, IV patency confirmed, oxygen available, reversal medications available and suction available   Procedure details (see MAR for exact dosages):    Preoxygenation:  Room air and nasal cannula   Sedation:  Propofol   Intended level of sedation: deep   Analgesia:  None   Intra-procedure monitoring:  Blood pressure monitoring, cardiac monitor, frequent LOC assessments, frequent vital sign checks, continuous pulse oximetry and continuous capnometry   Intra-procedure events: none     Total Provider sedation time (minutes):  30 Post-procedure details:    Post-sedation assessment completed:  06/04/2022 11:54 PM   Attendance: Constant attendance by certified staff until patient recovered     Recovery: Patient returned to pre-procedure baseline     Post-sedation assessments completed and reviewed: airway patency, cardiovascular function, hydration status, mental status, nausea/vomiting, pain level, respiratory function  and temperature     Patient is stable for discharge or admission: yes     Procedure  completion:  Tolerated well, no immediate complications     Wyvonnia Dusky, MD 06/04/22 2354

## 2022-06-04 NOTE — ED Triage Notes (Signed)
BIBA Per EMS: pt c/o fall w/ deformity of left arm.  Also c/o L ankle pain  VSS 124/86 82 HR  20 RR  98% RA  112 CBG

## 2022-06-04 NOTE — ED Provider Notes (Signed)
[place] DEPT Provider Note   CSN: [id number] Arrival date & time: 06/04/22  1846     History  Chief Complaint  Patient presents with   Fall   HPI Vickie Graham is a 46 y.o. female with CHF, atrial tachycardia, type 2 diabetes presenting for fall.  States at 6 PM this evening patient was walking and tripped on something rolled her ankle and caught herself with her outstretched left hand.  She denies syncope.  States that range of motion and sensation of the fingers are intact in the left hand. The arm is painful itself.  Patient stated that EMS noted obvious deformity of the left arm.  Has not taken any medication for pain.   Fall      Home Medications Prior to Admission medications   Medication Sig Start Date End Date Taking? Authorizing Provider  acetaminophen (TYLENOL) 325 MG tablet Take 2 tablets (650 mg total) by mouth every 6 (six) hours as needed for mild pain. 02/20/21  Yes Autry-Lott, Naaman Plummer, DO  empagliflozin (JARDIANCE) 10 MG TABS tablet Take 1 tablet (10 mg total) by mouth daily. 03/02/22  Yes [place], Maricela Bo, FNP  FLUoxetine (PROZAC) 40 MG capsule Take 1 capsule (40 mg total) by mouth daily. 03/30/22  Yes Alcus Dad, MD  fluticasone (FLONASE) 50 MCG/ACT nasal spray Place 1 spray into both nostrils 2 (two) times daily as needed for allergies or rhinitis.   Yes [provider]  furosemide (LASIX) 40 MG tablet Take 40 mg by mouth as needed for fluid or edema (AS DIRECTED).   Yes [provider]  loratadine (CLARITIN) 10 MG tablet Take 10 mg by mouth daily as needed for allergies or rhinitis.   Yes [provider]  metFORMIN (GLUCOPHAGE-XR) 500 MG 24 hr tablet Take 1 tablet (500 mg total) by mouth 2 (two) times daily. 03/30/22  Yes Alcus Dad, MD  metoprolol succinate (TOPROL-XL) 50 MG 24 hr tablet Take 1 tablet (50 mg total) by mouth daily AND 0.5 tablets (25 mg total) at bedtime. HF Fund. Patient taking  differently: Take 50 mg by mouth in the morning and 25 mg in the evening 05/15/22  Yes Bronson Curb, MD  metoprolol tartrate (LOPRESSOR) 25 MG tablet Take 1 tablet when you have palpitations Patient taking differently: Take 25 mg by mouth as needed (AS DIRECTD for palpitations). 05/15/22  Yes Bronson Curb, MD  potassium chloride SA (KLOR-CON M) 20 MEQ tablet Take 1 tablet (20 mEq total) by mouth daily. As needed when lasix dose is taken    HF Fund Patient taking differently: Take 20 mEq by mouth daily as needed (when a dose of Furosemide is taken). 03/02/22  Yes [place], Maricela Bo, FNP  sacubitril-valsartan (ENTRESTO) 97-103 MG Take 1 tablet by mouth 2 (two) times daily. 03/02/22  Yes [place], Maricela Bo, FNP  spironolactone (ALDACTONE) 25 MG tablet Take 1 tablet (25 mg total) by mouth daily. HF fund 03/02/22  Yes [place], Maricela Bo, FNP      Allergies    Statins, Sulfa antibiotics, and Latex    Review of Systems   Review of Systems  Musculoskeletal:        Left arm pain    Physical Exam Updated Vital Signs BP 134/82   Pulse 80   Temp 97.6 F (36.4 C)   Resp 17   Wt 115.7 kg   LMP 06/04/2022   SpO2 94%   BMI 49.80 kg/m  Physical Exam Vitals and  nursing note reviewed.  HENT:     Head: Normocephalic and atraumatic.     Mouth/Throat:     Mouth: Mucous membranes are moist.  Eyes:     General:        Right eye: No discharge.        Left eye: No discharge.     Conjunctiva/sclera: Conjunctivae normal.  Cardiovascular:     Rate and Rhythm: Normal rate and regular rhythm.     Pulses: Normal pulses.          Radial pulses are 2+ on the right side and 2+ on the left side.     Heart sounds: Normal heart sounds.  Pulmonary:     Effort: Pulmonary effort is normal.     Breath sounds: Normal breath sounds.  Abdominal:     General: Abdomen is flat.     Palpations: Abdomen is soft.  Musculoskeletal:     Right elbow: Normal.     Left elbow: Swelling and deformity present.      Right wrist: Normal.     Left wrist: Decreased range of motion.  Skin:    General: Skin is warm and dry.  Neurological:     General: No focal deficit present.  Psychiatric:        Mood and Affect: Mood normal.     ED Results / Procedures / Treatments   Labs (all labs ordered are listed, but only abnormal results are displayed) Labs Reviewed - No data to display  EKG None  Radiology DG Elbow 2 Views Left  Result Date: 06/04/2022 CLINICAL DATA:  Recent trip and fall with elbow pain, initial encounter EXAM: LEFT ELBOW - 2 VIEW COMPARISON:  None Available. FINDINGS: Dislocation of the left elbow is noted with posterior displacement of the radius and ulna with respect to the distal humerus. A few small bony fragments are noted likely related to the coronoid process. No other focal abnormality is noted. IMPRESSION: Posterior dislocation of the left elbow joint with some associated fractures likely related to the coronoid process. Electronically Signed   By: Inez Catalina M.D.   On: 06/04/2022 21:07   DG Humerus Left  Result Date: 06/04/2022 CLINICAL DATA:  Tripped and fell, pain EXAM: LEFT HUMERUS - 2+ VIEW COMPARISON:  None Available. FINDINGS: Frontal and lateral views of the left humerus are obtained. No evidence of acute fracture. Possible dislocation about the left elbow, recommend dedicated left elbow evaluation. Left shoulder is well aligned. No focal soft tissue abnormality. IMPRESSION: 1. Suspected left elbow dislocation, evaluation limited due to positioning. Dedicated left elbow series is recommended. 2. No evidence of humeral fracture. Electronically Signed   By: Randa Ngo M.D.   On: 06/04/2022 20:29   DG Wrist Complete Left  Result Date: 06/04/2022 CLINICAL DATA:  Recent trip and fall with wrist pain, initial encounter EXAM: LEFT WRIST - COMPLETE 3+ VIEW COMPARISON:  None Available. FINDINGS: There is no evidence of fracture or dislocation. There is no evidence of arthropathy  or other focal bone abnormality. Soft tissues are unremarkable. IMPRESSION: No acute abnormality noted Electronically Signed   By: Inez Catalina M.D.   On: 06/04/2022 20:27    Procedures Procedures    Medications Ordered in ED Medications  propofol (DIPRIVAN) 10 mg/mL bolus/IV push 118 mg (has no administration in time range)  morphine (PF) 4 MG/ML injection 4 mg (4 mg Intravenous Given 06/04/22 2042)    ED Course/ Medical Decision Making/ A&P Clinical Course as of 06/04/22  [place] Jun 04, 2022  2135 This is a 25 old female presented ED with left elbow pain after fall on outstretched wrist earlier today.  She is neurovascularly intact on exam.  She is found to have a left posterior elbow dislocation with a closed coronoid process fx. we discussed the case with hand surgery.  Morphine was given for pain. [MT]    Clinical Course User Index [MT] Trifan, Carola Rhine, MD                           Medical Decision Making Patient presented for left arm pain after a fall this afternoon.  On exam left arm deformity noted about the left elbow.  X-ray revealed dislocation of the left elbow with associated fracture of the coronoid process.  Reached out to Ortho who recommended reduction moderate sedation in the ED.  Plan will be to relocate the elbow with sedation.  Signed out patient to oncoming APP.  Attending is aware of plan will perform reduction.  Gave morphine for pain.        Final Clinical Impression(s) / ED Diagnoses Final diagnoses:  Dislocation of left elbow, initial encounter  Closed nondisplaced fracture of coronoid process of left ulna, initial encounter    Rx / DC Orders ED Discharge Orders     None         Harriet Pho, PA-C 06/04/22 2240    Wyvonnia Dusky, MD 06/04/22 2352

## 2022-06-04 NOTE — Discharge Instructions (Addendum)
You have a dislocation of your left elbow that was reduced in the ER today under anesthesia.  You should not drive a car tonight, as you may still have anesthesia in your system.  Please have a family member bring you home safely.  You will need to follow-up with a hand surgeon for the fracture at your elbow.  This is a chip fracture at the end of the bones that connect your elbow.  In the meantime you can apply ice or cooling packs to the elbow several times a day, 10 minutes at a time, for the next 3 days.  You can take ibuprofen and Tylenol together, over-the-counter, for pain.  I prescribed you a narcotic Percocet for more severe pain.  You should wear the cast and sling at all times.

## 2022-06-04 NOTE — Progress Notes (Signed)
Orthopedic Tech Progress Note Patient Details:  SHAMAINE MULKERN October 18, 1975 [id number]  Ortho Devices Type of Ortho Device: Post (long arm) splint, Sling immobilizer Ortho Device/Splint Location: lue Ortho Device/Splint Interventions: Ordered, Application, Adjustment   Post Interventions Patient Tolerated: Well Instructions Provided: Care of device, Adjustment of device  Karolee Stamps 06/04/2022, 11:37 PM

## 2022-06-05 NOTE — ED Notes (Signed)
Pt alert and oriented able to ambulate with steady gait from Emergency department

## 2022-06-14 ENCOUNTER — Other Ambulatory Visit (HOSPITAL_COMMUNITY): Payer: Medicaid Other

## 2022-06-14 ENCOUNTER — Encounter (HOSPITAL_COMMUNITY): Payer: Medicaid Other | Admitting: Cardiology

## 2022-06-18 ENCOUNTER — Ambulatory Visit (HOSPITAL_COMMUNITY)
Admission: RE | Admit: 2022-06-18 | Discharge: 2022-06-18 | Disposition: A | Discharge status: Home / Self Care | Payer: No Typology Code available for payment source | Attending: Orthopedic Surgery | Admitting: Orthopedic Surgery

## 2022-06-18 ENCOUNTER — Ambulatory Visit (HOSPITAL_COMMUNITY): Payer: No Typology Code available for payment source | Admitting: Certified Registered"

## 2022-06-18 ENCOUNTER — Ambulatory Visit (HOSPITAL_COMMUNITY): Payer: No Typology Code available for payment source

## 2022-06-18 ENCOUNTER — Encounter (HOSPITAL_COMMUNITY): Payer: Self-pay | Admitting: Orthopedic Surgery

## 2022-06-18 ENCOUNTER — Encounter (HOSPITAL_COMMUNITY)
Admission: RE | Disposition: A | Discharge status: Home / Self Care | Payer: Self-pay | Source: Home / Self Care | Attending: Orthopedic Surgery

## 2022-06-18 ENCOUNTER — Ambulatory Visit (HOSPITAL_BASED_OUTPATIENT_CLINIC_OR_DEPARTMENT_OTHER): Payer: No Typology Code available for payment source | Admitting: Certified Registered"

## 2022-06-18 ENCOUNTER — Other Ambulatory Visit: Payer: Self-pay

## 2022-06-18 DIAGNOSIS — S52042A Displaced fracture of coronoid process of left ulna, initial encounter for closed fracture: Secondary | ICD-10-CM

## 2022-06-18 DIAGNOSIS — Y990 Civilian activity done for income or pay: Secondary | ICD-10-CM | POA: Diagnosis not present

## 2022-06-18 DIAGNOSIS — I110 Hypertensive heart disease with heart failure: Secondary | ICD-10-CM | POA: Insufficient documentation

## 2022-06-18 DIAGNOSIS — I5022 Chronic systolic (congestive) heart failure: Secondary | ICD-10-CM | POA: Insufficient documentation

## 2022-06-18 DIAGNOSIS — Z6841 Body Mass Index (BMI) 40.0 and over, adult: Secondary | ICD-10-CM | POA: Insufficient documentation

## 2022-06-18 DIAGNOSIS — S53442A Ulnar collateral ligament sprain of left elbow, initial encounter: Secondary | ICD-10-CM

## 2022-06-18 DIAGNOSIS — E119 Type 2 diabetes mellitus without complications: Secondary | ICD-10-CM | POA: Insufficient documentation

## 2022-06-18 DIAGNOSIS — I509 Heart failure, unspecified: Secondary | ICD-10-CM | POA: Diagnosis not present

## 2022-06-18 DIAGNOSIS — W19XXXA Unspecified fall, initial encounter: Secondary | ICD-10-CM | POA: Diagnosis not present

## 2022-06-18 DIAGNOSIS — S53105A Unspecified dislocation of left ulnohumeral joint, initial encounter: Secondary | ICD-10-CM | POA: Diagnosis not present

## 2022-06-18 DIAGNOSIS — S83422A Sprain of lateral collateral ligament of left knee, initial encounter: Secondary | ICD-10-CM | POA: Diagnosis not present

## 2022-06-18 DIAGNOSIS — Z87891 Personal history of nicotine dependence: Secondary | ICD-10-CM | POA: Diagnosis not present

## 2022-06-18 HISTORY — PX: ORIF ELBOW FRACTURE: SHX5031

## 2022-06-18 HISTORY — DX: Heart failure, unspecified: I50.9

## 2022-06-18 LAB — CBC
HCT: 46.7 % — ABNORMAL HIGH (ref 36.0–46.0)
Hemoglobin: 15.4 g/dL — ABNORMAL HIGH (ref 12.0–15.0)
MCH: 27.5 pg (ref 26.0–34.0)
MCHC: 33 g/dL (ref 30.0–36.0)
MCV: 83.5 fL (ref 80.0–100.0)
Platelets: 268 10*3/uL (ref 150–400)
RBC: 5.59 MIL/uL — ABNORMAL HIGH (ref 3.87–5.11)
RDW: 14.6 % (ref 11.5–15.5)
WBC: 12.3 10*3/uL — ABNORMAL HIGH (ref 4.0–10.5)
nRBC: 0 % (ref 0.0–0.2)

## 2022-06-18 LAB — POCT PREGNANCY, URINE: Preg Test, Ur: NEGATIVE

## 2022-06-18 LAB — BASIC METABOLIC PANEL WITH GFR
Anion gap: 10 (ref 5–15)
BUN: 29 mg/dL — ABNORMAL HIGH (ref 6–20)
CO2: 18 mmol/L — ABNORMAL LOW (ref 22–32)
Calcium: 9.1 mg/dL (ref 8.9–10.3)
Chloride: 108 mmol/L (ref 98–111)
Creatinine, Ser: 0.77 mg/dL (ref 0.44–1.00)
GFR, Estimated: >60 mL/min (ref >60)
Glucose, Bld: 117 mg/dL — ABNORMAL HIGH (ref 70–99)
Potassium: 4.6 mmol/L (ref 3.5–5.1)
Sodium: 136 mmol/L (ref 135–145)

## 2022-06-18 LAB — GLUCOSE, CAPILLARY
Glucose-Capillary: 136 mg/dL — ABNORMAL HIGH (ref 70–99)
Glucose-Capillary: 110 mg/dL — ABNORMAL HIGH (ref 70–99)
Glucose-Capillary: 114 mg/dL — ABNORMAL HIGH (ref 70–99)

## 2022-06-18 SURGERY — OPEN REDUCTION INTERNAL FIXATION (ORIF) ELBOW/OLECRANON FRACTURE
Anesthesia: General | Site: Elbow | Laterality: Left

## 2022-06-18 MED ORDER — BUPIVACAINE-EPINEPHRINE (PF) 0.5% -1:200000 IJ SOLN
INTRAMUSCULAR | Status: DC | PRN
  Administered 2022-06-18: 30 mL via PERINEURAL

## 2022-06-18 MED ORDER — BUPIVACAINE HCL (PF) 0.25 % IJ SOLN
INTRAMUSCULAR | Status: AC
  Filled 2022-06-18: qty 10

## 2022-06-18 MED ORDER — LACTATED RINGERS IV SOLN: INTRAVENOUS | Status: DC | PRN

## 2022-06-18 MED ORDER — MIDAZOLAM HCL 2 MG/2ML IJ SOLN: 2.0000 mg | Freq: Once | INTRAMUSCULAR | Status: AC

## 2022-06-18 MED ORDER — CLONIDINE HCL (ANALGESIA) 100 MCG/ML EP SOLN
EPIDURAL | Status: DC | PRN
  Administered 2022-06-18: 100 ug

## 2022-06-18 MED ORDER — HYDROMORPHONE HCL 1 MG/ML IJ SOLN: 0.2500 mg | INTRAMUSCULAR | Status: DC | PRN

## 2022-06-18 MED ORDER — PROPOFOL 10 MG/ML IV BOLUS
INTRAVENOUS | Status: AC
  Filled 2022-06-18: qty 20

## 2022-06-18 MED ORDER — ORAL CARE MOUTH RINSE: 15.0000 mL | Freq: Once | OROMUCOSAL | Status: AC

## 2022-06-18 MED ORDER — LACTATED RINGERS IV SOLN: INTRAVENOUS | Status: DC

## 2022-06-18 MED ORDER — PHENYLEPHRINE HCL (PRESSORS) 10 MG/ML IV SOLN
INTRAVENOUS | Status: DC | PRN
  Administered 2022-06-18 (×2): 160 ug via INTRAVENOUS

## 2022-06-18 MED ORDER — PROPOFOL 10 MG/ML IV BOLUS
INTRAVENOUS | Status: DC | PRN
  Administered 2022-06-18: 200 mg via INTRAVENOUS

## 2022-06-18 MED ORDER — EPHEDRINE SULFATE (PRESSORS) 50 MG/ML IJ SOLN
INTRAMUSCULAR | Status: DC | PRN
  Administered 2022-06-18: 5 mg via INTRAVENOUS

## 2022-06-18 MED ORDER — PHENYLEPHRINE HCL-NACL 20-0.9 MG/250ML-% IV SOLN
INTRAVENOUS | Status: DC | PRN
  Administered 2022-06-18: 50 ug/min via INTRAVENOUS

## 2022-06-18 MED ORDER — CEFAZOLIN IN SODIUM CHLORIDE 3-0.9 GM/100ML-% IV SOLN
INTRAVENOUS | Status: AC
  Filled 2022-06-18: qty 100

## 2022-06-18 MED ORDER — LIDOCAINE HCL (CARDIAC) PF 100 MG/5ML IV SOSY
PREFILLED_SYRINGE | INTRAVENOUS | Status: DC | PRN
  Administered 2022-06-18: 100 mg via INTRAVENOUS

## 2022-06-18 MED ORDER — MIDAZOLAM HCL 2 MG/2ML IJ SOLN
INTRAMUSCULAR | Status: AC
  Administered 2022-06-18: 2 mg via INTRAVENOUS
  Filled 2022-06-18: qty 2

## 2022-06-18 MED ORDER — CHLORHEXIDINE GLUCONATE 0.12 % MT SOLN
15.0000 mL | Freq: Once | OROMUCOSAL | Status: AC
  Administered 2022-06-18: 15 mL via OROMUCOSAL
  Filled 2022-06-18: qty 15

## 2022-06-18 MED ORDER — FENTANYL CITRATE (PF) 100 MCG/2ML IJ SOLN
INTRAMUSCULAR | Status: AC
  Administered 2022-06-18: 100 ug via INTRAVENOUS
  Filled 2022-06-18: qty 2

## 2022-06-18 MED ORDER — SUGAMMADEX SODIUM 200 MG/2ML IV SOLN
INTRAVENOUS | Status: DC | PRN
  Administered 2022-06-18: 300 mg via INTRAVENOUS

## 2022-06-18 MED ORDER — MIDAZOLAM HCL 2 MG/2ML IJ SOLN
INTRAMUSCULAR | Status: DC | PRN
  Administered 2022-06-18: 2 mg via INTRAVENOUS

## 2022-06-18 MED ORDER — FENTANYL CITRATE (PF) 250 MCG/5ML IJ SOLN
INTRAMUSCULAR | Status: AC
  Filled 2022-06-18: qty 5

## 2022-06-18 MED ORDER — MIDAZOLAM HCL 2 MG/2ML IJ SOLN
INTRAMUSCULAR | Status: AC
  Filled 2022-06-18: qty 2

## 2022-06-18 MED ORDER — CEFAZOLIN SODIUM-DEXTROSE 2-4 GM/100ML-% IV SOLN
2.0000 g | INTRAVENOUS | Status: DC
  Filled 2022-06-18: qty 100

## 2022-06-18 MED ORDER — ACETAMINOPHEN 500 MG PO TABS
1000.0000 mg | ORAL_TABLET | Freq: Once | ORAL | Status: AC
  Administered 2022-06-18: 1000 mg via ORAL
  Filled 2022-06-18: qty 2

## 2022-06-18 MED ORDER — FENTANYL CITRATE (PF) 100 MCG/2ML IJ SOLN: 100.0000 ug | Freq: Once | INTRAMUSCULAR | Status: AC

## 2022-06-18 MED ORDER — ROCURONIUM 10MG/ML (10ML) SYRINGE FOR MEDFUSION PUMP - OPTIME
INTRAVENOUS | Status: DC | PRN
  Administered 2022-06-18: 60 mg via INTRAVENOUS

## 2022-06-18 MED ORDER — ONDANSETRON HCL 4 MG/2ML IJ SOLN
INTRAMUSCULAR | Status: DC | PRN
  Administered 2022-06-18: 4 mg via INTRAVENOUS

## 2022-06-18 MED ORDER — DEXAMETHASONE SODIUM PHOSPHATE 10 MG/ML IJ SOLN
INTRAMUSCULAR | Status: DC | PRN
  Administered 2022-06-18: 10 mg via INTRAVENOUS

## 2022-06-18 MED ORDER — FENTANYL CITRATE (PF) 250 MCG/5ML IJ SOLN
INTRAMUSCULAR | Status: DC | PRN
  Administered 2022-06-18: 50 ug via INTRAVENOUS

## 2022-06-18 MED ORDER — CEFAZOLIN SODIUM-DEXTROSE 2-4 GM/100ML-% IV SOLN
2.0000 g | INTRAVENOUS | Status: AC
  Administered 2022-06-18: 3 g via INTRAVENOUS

## 2022-06-18 SURGICAL SUPPLY — 64 items
ANCH SUT NDL DX FBRTK STRL LF (Anchor) ×2 IMPLANT
ANCHOR SUT FBRTK 1.3 SUTTAP (Anchor) IMPLANT
BAG COUNTER SPONGE SURGICOUNT (BAG) ×1 IMPLANT
BAG SPNG CNTER NS LX DISP (BAG) ×1
BNDG CMPR 9X4 STRL LF SNTH (GAUZE/BANDAGES/DRESSINGS) ×1
BNDG COHESIVE 4X5 TAN STRL (GAUZE/BANDAGES/DRESSINGS) ×1 IMPLANT
BNDG ELASTIC 3X5.8 VLCR STR LF (GAUZE/BANDAGES/DRESSINGS) IMPLANT
BNDG ELASTIC 4X5.8 VLCR STR LF (GAUZE/BANDAGES/DRESSINGS) IMPLANT
BNDG ESMARK 4X9 LF (GAUZE/BANDAGES/DRESSINGS) ×1 IMPLANT
BNDG GAUZE DERMACEA FLUFF 4 (GAUZE/BANDAGES/DRESSINGS) IMPLANT
BNDG GZE DERMACEA 4 6PLY (GAUZE/BANDAGES/DRESSINGS) ×1
CORD BIPOLAR FORCEPS 12FT (ELECTRODE) ×1 IMPLANT
COVER MAYO STAND STRL (DRAPES) ×1 IMPLANT
COVER SURGICAL LIGHT HANDLE (MISCELLANEOUS) ×1 IMPLANT
CUFF TOURN SGL QUICK 18X4 (TOURNIQUET CUFF) ×1 IMPLANT
CUFF TOURN SGL QUICK 24 (TOURNIQUET CUFF)
CUFF TRNQT CYL 24X4X16.5-23 (TOURNIQUET CUFF) IMPLANT
DRAPE INCISE IOBAN 66X45 STRL (DRAPES) ×1 IMPLANT
DRAPE OEC MINIVIEW 54X84 (DRAPES) IMPLANT
DRAPE ORTHO SPLIT 77X108 STRL (DRAPES) ×2
DRAPE SURG ORHT 6 SPLT 77X108 (DRAPES) ×2 IMPLANT
DRAPE U-SHAPE 47X51 STRL (DRAPES) ×1 IMPLANT
DRSG ADAPTIC 3X8 NADH LF (GAUZE/BANDAGES/DRESSINGS) IMPLANT
GAUZE SPONGE 4X4 12PLY STRL (GAUZE/BANDAGES/DRESSINGS) IMPLANT
GAUZE XEROFORM 5X9 LF (GAUZE/BANDAGES/DRESSINGS) ×1 IMPLANT
GLOVE BIOGEL PI IND STRL 8.5 (GLOVE) ×1 IMPLANT
GLOVE SURG ORTHO 8.0 STRL STRW (GLOVE) ×1 IMPLANT
GOWN STRL REUS W/ TWL LRG LVL3 (GOWN DISPOSABLE) ×2 IMPLANT
GOWN STRL REUS W/ TWL XL LVL3 (GOWN DISPOSABLE) ×1 IMPLANT
GOWN STRL REUS W/TWL LRG LVL3 (GOWN DISPOSABLE) ×2
GOWN STRL REUS W/TWL XL LVL3 (GOWN DISPOSABLE) ×1
KIT BASIN OR (CUSTOM PROCEDURE TRAY) ×1 IMPLANT
KIT FIBERTAK DX 1.6 DISP (KITS) IMPLANT
KIT TURNOVER KIT B (KITS) ×1 IMPLANT
LOOP VESSEL MAXI BLUE (MISCELLANEOUS) IMPLANT
MANIFOLD NEPTUNE II (INSTRUMENTS) ×1 IMPLANT
NDL HYPO 25GX1X1/2 BEV (NEEDLE) IMPLANT
NEEDLE HYPO 25GX1X1/2 BEV (NEEDLE) IMPLANT
NS IRRIG 1000ML POUR BTL (IV SOLUTION) ×1 IMPLANT
PACK ORTHO EXTREMITY (CUSTOM PROCEDURE TRAY) ×1 IMPLANT
PAD ARMBOARD 7.5X6 YLW CONV (MISCELLANEOUS) ×2 IMPLANT
PAD CAST 3X4 CTTN HI CHSV (CAST SUPPLIES) IMPLANT
PAD CAST 4YDX4 CTTN HI CHSV (CAST SUPPLIES) IMPLANT
PADDING CAST COTTON 3X4 STRL (CAST SUPPLIES) ×1
PADDING CAST COTTON 4X4 STRL (CAST SUPPLIES) ×1
SLING ARM FOAM STRAP LRG (SOFTGOODS) IMPLANT
SOAP 2 % CHG 4 OZ (WOUND CARE) ×1 IMPLANT
SPLINT FIBERGLASS 3X35 (CAST SUPPLIES) IMPLANT
SUCTION FRAZIER HANDLE 10FR (MISCELLANEOUS)
SUCTION TUBE FRAZIER 10FR DISP (MISCELLANEOUS) IMPLANT
SUT PROLENE 3 0 PS 1 (SUTURE) IMPLANT
SUT PROLENE 3 0 PS 2 (SUTURE) IMPLANT
SUT PROLENE 4 0 PS 2 18 (SUTURE) IMPLANT
SUT VIC AB 2-0 CT1 27 (SUTURE)
SUT VIC AB 2-0 CT1 TAPERPNT 27 (SUTURE) IMPLANT
SUT VIC AB 2-0 CTB1 (SUTURE) IMPLANT
SUT VIC AB 4-0 PS2 18 (SUTURE) IMPLANT
SUT VICRYL 4-0 PS2 18IN ABS (SUTURE) IMPLANT
SYR CONTROL 10ML LL (SYRINGE) IMPLANT
TOWEL GREEN STERILE (TOWEL DISPOSABLE) ×2 IMPLANT
TOWEL GREEN STERILE FF (TOWEL DISPOSABLE) ×1 IMPLANT
TUBE CONNECTING 12X1/4 (SUCTIONS) IMPLANT
UNDERPAD 30X36 HEAVY ABSORB (UNDERPADS AND DIAPERS) ×1 IMPLANT
WATER STERILE IRR 1000ML POUR (IV SOLUTION) ×1 IMPLANT

## 2022-06-18 NOTE — Anesthesia Procedure Notes (Signed)
Anesthesia Regional Block: Supraclavicular block   Pre-Anesthetic Checklist: , timeout performed,  Correct Patient, Correct Site, Correct Laterality,  Correct Procedure, Correct Position, site marked,  Risks and benefits discussed,  Pre-op evaluation,  At surgeon's request and post-op pain management  Laterality: Left  Prep: Maximum Sterile Barrier Precautions used, chloraprep       Needles:  Injection technique: Single-shot  Needle Type: Echogenic Stimulator Needle     Needle Length: 9cm  Needle Gauge: 22     Additional Needles:   Procedures:,,,, ultrasound used (permanent image in chart),,    Narrative:  Start time: 06/18/2022 12:43 PM End time: 06/18/2022 12:46 PM Injection made incrementally with aspirations every 5 mL.  Performed by: Personally  Anesthesiologist: Brennan Bailey, MD  Additional Notes: Risks, benefits, and alternative discussed. Patient gave consent for procedure. Patient prepped and draped in sterile fashion. Sedation administered, patient remains easily responsive to voice. Relevant anatomy identified with ultrasound guidance. Local anesthetic given in 5cc increments with no signs or symptoms of intravascular injection. No pain or paraesthesias with injection. Patient monitored throughout procedure with signs of LAST or immediate complications. Tolerated well. Ultrasound image placed in chart.  Tawny Asal, MD

## 2022-06-18 NOTE — Transfer of Care (Signed)
Immediate Anesthesia Transfer of Care Note  Patient: Vickie Graham  Procedure(s) Performed: OPEN REDUCTION INTERNAL FIXATION (ORIF) left ELBOW/OLECRANON FRACTURE/dislocation and repair as indicated bone and ligament (Left: Elbow)  Patient Location: PACU  Anesthesia Type:GA combined with regional for post-op pain  Level of Consciousness: drowsy and patient cooperative  Airway & Oxygen Therapy: Patient Spontanous Breathing and Patient connected to face mask oxygen  Post-op Assessment: Report given to RN and Post -op Vital signs reviewed and stable  Post vital signs: Reviewed and stable  Last Vitals:  Vitals Value Taken Time  BP 103/75 06/18/22 1432  Temp    Pulse 99 06/18/22 1435  Resp 15 06/18/22 1435  SpO2 95 % 06/18/22 1435  Vitals shown include unvalidated device data.  Last Pain:  Vitals:   06/18/22 1058  TempSrc:   PainSc: 3          Complications: No notable events documented.

## 2022-06-18 NOTE — Op Note (Signed)
PREOPERATIVE DIAGNOSIS: Left elbow dislocation with coronoid fracture and lateral collateral ligament tear  POSTOPERATIVE DIAGNOSIS: Same  ATTENDING SURGEON: Dr. Iran Planas who scrubbed and present for the entire procedure  ASSISTANT SURGEON: Gertie Fey PA-C was scrubbed necessary for open reduction fragment excision ligamentous repair closure and splinting in a timely fashion  ANESTHESIA: General via endotracheal tube with regional block  OPERATIVE PROCEDURE: Open treatment of left elbow dislocation without internal fixation Open treatment of left elbow coronoid fracture with fragment excision Repair left elbow lateral ulnar collateral ligament primary repair with local tissue Radiographs 3 views left elbow  IMPLANTS: Arthrex suture anchors x2 rigid suture anchors  EBL: Minimal  RADIOGRAPHIC INTERPRETATION: AP and lateral oblique views of the elbow do show the reduced ulnohumeral joint there is good alignment of the radiocapitellar and ulnohumeral joint in all planes  SURGICAL INDICATION: The patient is a right-hand-dominant female who sustained a closed injury to her left elbow.  Patient was seen and evaluated the office and recommend undergo the above procedure.  The risks of surgery include but not limited to bleeding infection damage nearby nerves arteries or tendons loss of motion of the wrist and digits incomplete relief of symptoms need for further surgical intervention heterotopic bone ossification loss of motion of the elbow and joint contracture.  SURGICAL TECHNIQUE: The patient was properly identified in the preoperative holding area marked apart a marker made in the left elbow and indicate correct operative site.  The patient then brought back to operating placed supine on the anesthesia table where the regional and general anesthetic had been administered.  Patient tolerated this well.  A well-padded tourniquet was then placed in the left brachium and sealed with the  appropriate drape.  The left upper extremities then prepped and draped normal sterile fashion.  A well-padded tourniquet placed placed on the left brachium.  A timeout was called the correct site was identified procedure then begun.  The tourniquet was insufflated.  A curvilinear incision made directly over the lateral aspect of the elbow.  Dissection carried down through the skin and subcutaneous tissue.  The extensor interval was split longitudinally exposing the joint.  Joint arthrotomy was then carried out and the joint was then exposed.  The patient had a large hematoma which was evacuated.  Several loose fragments from the coronoid were also removed.  Open treatment of the coronoid fracture was done with fragment excision.  Open treatment of the elbow dislocation was then done under direct visualization reducing the elbow joint nicely.  After evacuation of the hematoma and thorough irrigation of the joint the joint had a good concentric reduction and was not grossly unstable with the excision of the coronoid fragment.  This was placed under live fluoroscopy imaging.  Once this was noted attention was then turned to repair of the lateral ulnar collateral ligament.  2 Arthrex suture anchors were then placed along the lateral aspect of the elbow.  These were then brought through the lateral collateral ligament complex and tied down nicely the bone.  These were then reinforced and tied back over and closed over the extensor interval nicely.  The wound was then irrigated.  After lateral collateral ligament repair the extensor interval was closed with 2-0 Vicryl.  Subcutaneous tissues closed with Vicryl.  Skin closed with horizontal mattress Prolene sutures.  Adaptic dressing a sterile compressive bandage then applied.  The patient was placed in a well-padded splint posterior splint keeping the forearm in a pronated position.  The patient  was extubated taken recovery in good condition.  POSTOPERATIVE PLAN: Patient  be discharged to home.  See him back in the office in 2 weeks.  We will keep her in an elbow flexed and forearm pronated position and send her down to therapy next visit.  At that point she will have a long-arm splint made begin a postoperative lateral collateral ligament repair protocol.  Radiographs at each visit.  At the first visit again making sure that the elbow stays in a flexed position palm down when we x-ray her AP and lateral views of the distal humerus and elbow.

## 2022-06-18 NOTE — Anesthesia Preprocedure Evaluation (Addendum)
Anesthesia Evaluation  Patient identified by MRN, date of birth, ID band Patient awake    Reviewed: Allergy & Precautions, H&P , NPO status , Patient's Chart, lab work & pertinent test results  History of Anesthesia Complications Negative for: history of anesthetic complications  Airway Mallampati: II  TM Distance: >3 FB Neck ROM: Full    Dental  (+) Dental Advisory Given   Pulmonary former smoker   Pulmonary exam normal        Cardiovascular hypertension, Pt. on medications +CHF  Normal cardiovascular exam  TTE 06/26/21: Diffuse hypokinesis worse in the septum and apex, EF 40-45%, mild LVE, mild LAE   Neuro/Psych   Anxiety Depression    negative neurological ROS     GI/Hepatic negative GI ROS, Neg liver ROS,,,  Endo/Other  diabetes, Type 2, Oral Hypoglycemic AgentsHypothyroidism Morbid obesity (BMI 50)  Renal/GU negative Renal ROS  negative genitourinary   Musculoskeletal   Abdominal   Peds  Hematology negative hematology ROS (+)   Anesthesia Other Findings   Reproductive/Obstetrics negative OB ROS                            Anesthesia Physical Anesthesia Plan  ASA: 3  Anesthesia Plan: General   Post-op Pain Management: Tylenol PO (pre-op)* and Regional block*   Induction: Intravenous  PONV Risk Score and Plan: 3 and Midazolam, Ondansetron and Dexamethasone  Airway Management Planned: Oral ETT  NoneAdditional Equipment:   Intra-op Plan:   Extubation in ORPost-operative Plan:   Informed Consent: I have reviewed the patients History and Physical, chart, labs and discussed the procedure including the risks, benefits and alternatives for the proposed anesthesia with the patient or authorized representative who has indicated his/her understanding and acceptance.     Dental advisory given  Plan Discussed with: CRNA  Anesthesia Plan Comments:        Anesthesia Quick  Evaluation

## 2022-06-18 NOTE — Anesthesia Procedure Notes (Signed)
Procedure Name: Intubation Date/Time: 06/18/2022 1:15 PM  Performed by: Claris Che, CRNAPre-anesthesia Checklist: Patient identified, Emergency Drugs available, Suction available and Patient being monitored Patient Re-evaluated:Patient Re-evaluated prior to induction Oxygen Delivery Method: Circle System Utilized Preoxygenation: Pre-oxygenation with 100% oxygen Induction Type: IV induction Ventilation: Mask ventilation without difficulty Laryngoscope Size: Mac and 4 Grade View: Grade I Tube type: Oral Tube size: 7.0 mm Number of attempts: 1 Airway Equipment and Method: Stylet Placement Confirmation: ETT inserted through vocal cords under direct vision, positive ETCO2 and breath sounds checked- equal and bilateral Secured at: 22 cm Tube secured with: Tape Dental Injury: Teeth and Oropharynx as per pre-operative assessment

## 2022-06-18 NOTE — Discharge Instructions (Addendum)
KEEP BANDAGE CLEAN AND DRY CALL OFFICE FOR F/U APPT [phone] IN 10 DAYS RX sent to [facility] - Percocet 5-'325mg'$ , Methocarbamol, Colace KEEP HAND ELEVATED ABOVE HEART OK TO APPLY ICE TO OPERATIVE AREA CONTACT OFFICE IF ANY WORSENING PAIN OR CONCERNS.

## 2022-06-18 NOTE — H&P (Addendum)
Vickie Graham is an 46 y.o. female.   Chief Complaint: LEFT ELBOW PAIN  HPI: The patient is a 46y/o left hand dominant female who fell over a mop sink at work on 06/04/22 catching herself with the left arm. She was treated initially at the ED and followed up in our office. She has continued to have pain, swelling, weakness, and stiffness. CT scan of the elbow showed the dislocation of the elbow with the coronoid fracture and lateral ulnar collateral ligament tear.  She is here today for surgery.  She denies chest pain, shortness of breath, fever, chills, nausea, vomiting, or diarrhea.    Past Medical History:  Diagnosis Date   ADHD (attention deficit hyperactivity disorder)    diagnosed at [facility].   Allergy    Anxiety    Social anxiety, generalized anxiety disorder.   Depression    Diabetes mellitus without complication ([place])    HFrEF (heart failure with reduced ejection fraction) ([facility])    Hypertension    SVT (supraventricular tachycardia) ([place])     Past Surgical History:  Procedure Laterality Date   RIGHT/LEFT HEART CATH AND CORONARY ANGIOGRAPHY N/A 02/16/2021   Procedure: RIGHT/LEFT HEART CATH AND CORONARY ANGIOGRAPHY;  Surgeon: Larey Dresser, MD;  Location: [place] [facility];  Service: Cardiovascular;  Laterality: N/A;    No family history on file. Social History:  reports that she quit smoking about 12 years ago. Her smoking use included cigarettes. She has never used smokeless tobacco. She reports that she does not drink alcohol and does not use drugs.  Allergies:  Allergies  Allergen Reactions   Statins Shortness Of Breath and Other (See Comments)    Fatigue, also   Sulfa Antibiotics Other (See Comments)    Reaction not known   Latex Rash    No medications prior to admission.    No results found for this or any previous visit (from the past 48 hour(s)). No results found.  ROS NO RECENT ILLNESSES OR HOSPITALIZATIONS  Last menstrual period  06/04/2022. Physical Exam  General Appearance:  Alert, cooperative, no distress, appears stated age  Head:  Normocephalic, without obvious abnormality, atraumatic  Eyes:  Pupils equal, conjunctiva/corneas clear,         Throat: Lips, mucosa, and tongue normal; teeth and gums normal  Neck: No visible masses     Lungs:   respirations unlabored  Chest Wall:  No tenderness or deformity  Heart:  Regular rate and rhythm,  Abdomen:   Soft, non-tender,         Extremities: LUE: SPLINT INTACT, FINGERS WARM WELL PERFUSED, ABLE TO EXTEND THUMB ABLE TO EXTEND FINGERS,   Pulses: 2+ and symmetric  Skin: Skin color, texture, turgor normal, no rashes or lesions     Neurologic: Normal     Assessment/Plan LEFT ELBOW DISLOCATION, CORONOID FRACTURE, AND COLLATERAL LIGAMENT TEAR     - LEFT ELBOW OPEN TREATMENT OF ELBOW DISLOCATION AND REPAIR AS INDICATED OF BONE AND LIGAMENT  R/B/A DISCUSSED WITH PT IN OFFICE.  PT VOICED UNDERSTANDING OF PLAN CONSENT SIGNED DAY OF SURGERY PT SEEN AND EXAMINED PRIOR TO OPERATIVE PROCEDURE/DAY OF SURGERY SITE MARKED. QUESTIONS ANSWERED WILL GO HOME FOLLOWING SURGERY   WE ARE PLANNING SURGERY FOR YOUR UPPER EXTREMITY. THE RISKS AND BENEFITS OF SURGERY INCLUDE BUT NOT LIMITED TO BLEEDING INFECTION, DAMAGE TO NEARBY NERVES ARTERIES TENDONS, FAILURE OF SURGERY TO ACCOMPLISH ITS INTENDED GOALS, PERSISTENT SYMPTOMS AND NEED FOR FURTHER SURGICAL INTERVENTION. WITH THIS IN MIND WE  WILL PROCEED. I HAVE DISCUSSED WITH THE PATIENT THE PRE AND POSTOPERATIVE REGIMEN AND THE DOS AND DON'TS. PT VOICED UNDERSTANDING AND INFORMED CONSENT SIGNED.   Jaedan Schuman [facility] MD 06/18/2022  Brynda Peon 06/18/2022, 10:22 AM

## 2022-06-19 ENCOUNTER — Encounter (HOSPITAL_COMMUNITY): Payer: Self-pay | Admitting: Orthopedic Surgery

## 2022-06-19 NOTE — Anesthesia Postprocedure Evaluation (Signed)
Anesthesia Post Note  Patient: Vickie Graham  Procedure(s) Performed: OPEN REDUCTION INTERNAL FIXATION (ORIF) left ELBOW/OLECRANON FRACTURE/dislocation and repair as indicated bone and ligament (Left: Elbow)     Patient location during evaluation: PACU Anesthesia Type: General Level of consciousness: awake and alert Pain management: pain level controlled Vital Signs Assessment: post-procedure vital signs reviewed and stable Respiratory status: spontaneous breathing, nonlabored ventilation and respiratory function stable Cardiovascular status: blood pressure returned to baseline Postop Assessment: no apparent nausea or vomiting Anesthetic complications: no   No notable events documented.  Last Vitals:  Vitals:   06/18/22 1515 06/18/22 1530  BP: (!) 99/51 (!) 93/58  Pulse: 83 (!) 108  Resp: 15 11  Temp:    SpO2: 91% 95%    Last Pain:  Vitals:   06/18/22 1530  TempSrc:   PainSc: 0-No pain                 Marthenia Rolling

## 2022-07-16 ENCOUNTER — Telehealth (HOSPITAL_COMMUNITY): Payer: Self-pay | Admitting: Pharmacy Technician

## 2022-07-16 ENCOUNTER — Other Ambulatory Visit (HOSPITAL_COMMUNITY): Payer: Self-pay | Admitting: *Deleted

## 2022-07-16 ENCOUNTER — Other Ambulatory Visit (HOSPITAL_COMMUNITY): Payer: Self-pay

## 2022-07-16 MED ORDER — METOPROLOL SUCCINATE ER 50 MG PO TB24
ORAL_TABLET | ORAL | 3 refills | Status: DC
  Filled 2022-07-16: qty 120, 80d supply, fill #0

## 2022-07-16 NOTE — Telephone Encounter (Signed)
Advanced Heart Failure Patient Advocate Encounter  Patient left vm requesting help with Jardiance and Delene Loll now that she is uninsured. Called and spoke with the patient. Sent free trial information via email to help bridge while we apply for patient assistance. She is aware that email will come separately via docusign to submit the application.   Will follow up.   Of note, she needed refill of metoprolol. Explained which medications we could fill under the hf while she is uninsured. Sent 30 day RX request to [place] ([facility]) to send to [facility] for mail.

## 2022-07-17 ENCOUNTER — Other Ambulatory Visit (HOSPITAL_COMMUNITY): Payer: Self-pay

## 2022-07-17 MED ORDER — SACUBITRIL-VALSARTAN 97-103 MG PO TABS: 1.0000 | ORAL_TABLET | Freq: Two times a day (BID) | ORAL | 3 refills | Status: DC

## 2022-07-17 NOTE — Telephone Encounter (Addendum)
Advanced Heart Failure Patient Advocate Encounter  Sent in both applications via fax. Patient is aware that [employer] requires POI before she can obtain an approval. Document scanned to chart.   Will follow up.

## 2022-07-18 NOTE — Progress Notes (Deleted)
Electrophysiology Office Note:    Date:  07/18/2022   ID:  Vickie Graham, DOB 1976/09/04, MRN [id number]  PCP:  Alcus Dad, MD  [facility] Cardiologist:  None  [facility] Electrophysiologist:  None   Referring MD: Larey Dresser, MD   Chief Complaint: SVT  History of Present Illness:    Vickie Graham is a 46 y.o. female who presents for an evaluation of SVT at the request of Dr Aundra Dubin.  She has a history of chronic systolic heart failure, hypertension, SVT, anxiety.  She was seen in the emergency department on May 15, 2022 with sudden onset palpitations.  Symptoms started at 11 AM and persisted.  Vagal maneuvers in the emergency department slowed her rhythm but returned to the tachycardia within about 20 seconds.  The patient had previously seen Dr. Curt Bears in June 2022.  Medical therapy was recommended given her BMI and its impact on safety/efficacy of ablation.     Past Medical History:  Diagnosis Date   ADHD (attention deficit hyperactivity disorder)    diagnosed at [facility] clinic.   Allergy    Anxiety    Social anxiety, generalized anxiety disorder.   CHF (congestive heart failure) (HCC)    Depression    Diabetes mellitus without complication (HCC)    HFrEF (heart failure with reduced ejection fraction) (HCC)    Hypertension    SVT (supraventricular tachycardia)     Past Surgical History:  Procedure Laterality Date   ORIF ELBOW FRACTURE Left 06/18/2022   Procedure: OPEN REDUCTION INTERNAL FIXATION (ORIF) left ELBOW/OLECRANON FRACTURE/dislocation and repair as indicated bone and ligament;  Surgeon: Iran Planas, MD;  Location: [facility];  Service: Orthopedics;  Laterality: Left;  123mn regional with iv sedation   RIGHT/LEFT HEART CATH AND CORONARY ANGIOGRAPHY N/A 02/16/2021   Procedure: RIGHT/LEFT HEART CATH AND CORONARY ANGIOGRAPHY;  Surgeon: MLarey Dresser MD;  Location[facility];  Service: Cardiovascular;  Laterality: N/A;    Current  Medications: No outpatient medications have been marked as taking for the 07/19/22 encounter (Appointment) with LVickie Epley MD.     Allergies:   Statins, Sulfa antibiotics, and Latex   Social History   Socioeconomic History   Marital status: Married    Spouse name: Not on file   Number of children: Not on file   Years of education: Not on file   Highest education level: Not on file  Occupational History   Not on file  Tobacco Use   Smoking status: Former    Types: Cigarettes    Quit date: 01/12/2010    Years since quitting: 12.5   Smokeless tobacco: Never  Vaping Use   Vaping Use: Never used  Substance and Sexual Activity   Alcohol use: No   Drug use: No   Sexual activity: Not on file  Other Topics Concern   Not on file  Social History Narrative   Marital status: divorced and engaged since 2011.     Children: 2 children      Lives with fiance and two children ([phone])      Employment: childcare early development; 3s and 4s. [employer]or, [employer]     Tobacco: none      Alcohol: none      Drugs; None      Exercise: sporadic.           Social Determinants of Health   Financial Resource Strain: Medium Risk (02/15/2021)   Overall Financial Resource Strain (CARDIA)  Difficulty of Paying Living Expenses: Somewhat hard  Food Insecurity: No Food Insecurity (03/02/2021)   Hunger Vital Sign    Worried About Running Out of Food in the Last Year: Never true    Ran Out of Food in the Last Year: Never true  Transportation Needs: No Transportation Needs (02/15/2021)   PRAPARE - Hydrologist (Medical): No    Lack of Transportation (Non-Medical): No  Physical Activity: Not on file  Stress: Not on file  Social Connections: Not on file     Family History: The patient's family history is not on file.  ROS:   Please see the history of present illness.    All other systems reviewed and are negative.  EKGs/Labs/Other Studies Reviewed:    The  following studies were reviewed today:  05/15/2022 ECG SVT, narrow complex. Appears to be long RP.      Recent Labs: 04/02/2022: B Natriuretic Peptide 16.5 05/15/2022: ALT 18; Magnesium 2.1; TSH 0.409 06/18/2022: BUN 29; Creatinine, Ser 0.77; Hemoglobin 15.4; Platelets 268; Potassium 4.6; Sodium 136  Recent Lipid Panel    Component Value Date/Time   CHOL 232 (H) 03/02/2022 0859   CHOL 152 04/17/2021 0844   TRIG 330 (H) 03/02/2022 0859   HDL 29 (L) 03/02/2022 0859   HDL 30 (L) 04/17/2021 0844   CHOLHDL 8.0 03/02/2022 0859   VLDL 66 (H) 03/02/2022 0859   LDLCALC 137 (H) 03/02/2022 0859   LDLCALC 77 04/17/2021 0844   LDLDIRECT 68.0 03/29/2021 1315    Physical Exam:    VS:  There were no vitals taken for this visit.    Wt Readings from Last 3 Encounters:  06/18/22 256 lb (116.1 kg)  06/04/22 255 lb (115.7 kg)  05/15/22 262 lb (118.8 kg)     GEN: *** Well nourished, well developed in no acute distress HEENT: Normal NECK: No JVD; No carotid bruits LYMPHATICS: No lymphadenopathy CARDIAC: ***RRR, no murmurs, rubs, gallops RESPIRATORY:  Clear to auscultation without rales, wheezing or rhonchi  ABDOMEN: Soft, non-tender, non-distended MUSCULOSKELETAL:  No edema; No deformity  SKIN: Warm and dry NEUROLOGIC:  Alert and oriented x 3 PSYCHIATRIC:  Normal affect       ASSESSMENT:    1. HFrEF (heart failure with reduced ejection fraction) ([place])   2. Atrial tachycardia   3. Morbid obesity ([place])    PLAN:    In order of problems listed above:  #Chronic systolic heart failure  #Atrial tachycardia  #Morbid obesity     Medication Adjustments/Labs and Tests Ordered: Current medicines are reviewed at length with the patient today.  Concerns regarding medicines are outlined above.  No orders of the defined types were placed in this encounter.  No orders of the defined types were placed in this encounter.    Signed, Hilton Cork. Quentin Ore, MD, [facility], [facility] 07/18/2022 1:14  PM    Electrophysiology [facility]

## 2022-07-19 ENCOUNTER — Institutional Professional Consult (permissible substitution): Payer: Medicaid Other | Admitting: Cardiology

## 2022-07-19 DIAGNOSIS — I5020 Unspecified systolic (congestive) heart failure: Secondary | ICD-10-CM

## 2022-07-19 DIAGNOSIS — I4719 Other supraventricular tachycardia: Secondary | ICD-10-CM

## 2022-07-19 NOTE — Telephone Encounter (Signed)
Advanced Heart Failure Patient Advocate Encounter   Patient was approved to receive Jardiance from [place]  Effective dates: 07/19/22 through 07/19/23  Sent update via email. Document scanned to chart.   Charlann Boxer, CPhT

## 2022-07-27 NOTE — Telephone Encounter (Signed)
Advanced Heart Failure Patient Advocate Encounter  Sent income information via efax.

## 2022-08-08 NOTE — Telephone Encounter (Signed)
Advanced Heart Failure Patient Advocate Encounter  Patient was approved to receive Entresto from [employer] Effective 08/08/2022 to 08/09/2023 Spoke to patient on the phone to confirm Jardiance and Delene Loll are both approved. Also of note, patient has Medicaid coverage that will become effective on 08/17/2022. Determination letter has been added to patient chart.  Clista Bernhardt, CPhT Rx Patient Advocate Phone: [phone]

## 2022-09-02 ENCOUNTER — Encounter: Payer: Self-pay | Admitting: Pharmacist Clinician (PhC)/ Clinical Pharmacy Specialist

## 2022-09-12 ENCOUNTER — Emergency Department (HOSPITAL_COMMUNITY): Payer: Medicaid Other

## 2022-09-12 ENCOUNTER — Other Ambulatory Visit: Payer: Self-pay

## 2022-09-12 ENCOUNTER — Emergency Department (HOSPITAL_COMMUNITY)
Admission: EM | Admit: 2022-09-12 | Discharge: 2022-09-13 | Disposition: A | Discharge status: Home / Self Care | Payer: Medicaid Other | Attending: Emergency Medicine | Admitting: Emergency Medicine

## 2022-09-12 DIAGNOSIS — Z91040 Latex allergy status: Secondary | ICD-10-CM | POA: Insufficient documentation

## 2022-09-12 DIAGNOSIS — I509 Heart failure, unspecified: Secondary | ICD-10-CM | POA: Insufficient documentation

## 2022-09-12 DIAGNOSIS — E119 Type 2 diabetes mellitus without complications: Secondary | ICD-10-CM | POA: Diagnosis not present

## 2022-09-12 DIAGNOSIS — R059 Cough, unspecified: Secondary | ICD-10-CM | POA: Diagnosis not present

## 2022-09-12 DIAGNOSIS — R0602 Shortness of breath: Secondary | ICD-10-CM | POA: Insufficient documentation

## 2022-09-12 DIAGNOSIS — Z7984 Long term (current) use of oral hypoglycemic drugs: Secondary | ICD-10-CM | POA: Diagnosis not present

## 2022-09-12 DIAGNOSIS — Z79899 Other long term (current) drug therapy: Secondary | ICD-10-CM | POA: Insufficient documentation

## 2022-09-12 DIAGNOSIS — R002 Palpitations: Secondary | ICD-10-CM | POA: Insufficient documentation

## 2022-09-12 LAB — COMPREHENSIVE METABOLIC PANEL
ALT: 26 U/L (ref 0–44)
AST: 18 U/L (ref 15–41)
Albumin: 3.6 g/dL (ref 3.5–5.0)
Alkaline Phosphatase: 69 U/L (ref 38–126)
Anion gap: 12 (ref 5–15)
BUN: 22 mg/dL — ABNORMAL HIGH (ref 6–20)
CO2: 19 mmol/L — ABNORMAL LOW (ref 22–32)
Calcium: 9.4 mg/dL (ref 8.9–10.3)
Chloride: 105 mmol/L (ref 98–111)
Creatinine, Ser: 0.76 mg/dL (ref 0.44–1.00)
GFR, Estimated: >60 mL/min (ref >60)
Glucose, Bld: 111 mg/dL — ABNORMAL HIGH (ref 70–99)
Potassium: 3.9 mmol/L (ref 3.5–5.1)
Sodium: 136 mmol/L (ref 135–145)
Total Bilirubin: 0.6 mg/dL (ref 0.3–1.2)
Total Protein: 7 g/dL (ref 6.5–8.1)

## 2022-09-12 LAB — CBC WITH DIFFERENTIAL/PLATELET
Abs Immature Granulocytes: 0.25 10*3/uL — ABNORMAL HIGH (ref 0.00–0.07)
Basophils Absolute: 0.1 10*3/uL (ref 0.0–0.1)
Basophils Relative: 1 %
Eosinophils Absolute: 0.2 10*3/uL (ref 0.0–0.5)
Eosinophils Relative: 1 %
HCT: 49.8 % — ABNORMAL HIGH (ref 36.0–46.0)
Hemoglobin: 15.5 g/dL — ABNORMAL HIGH (ref 12.0–15.0)
Immature Granulocytes: 2 %
Lymphocytes Relative: 24 %
Lymphs Abs: 3.8 10*3/uL (ref 0.7–4.0)
MCH: 25.5 pg — ABNORMAL LOW (ref 26.0–34.0)
MCHC: 31.1 g/dL (ref 30.0–36.0)
MCV: 82 fL (ref 80.0–100.0)
Monocytes Absolute: 1.5 10*3/uL — ABNORMAL HIGH (ref 0.1–1.0)
Monocytes Relative: 9 %
Neutro Abs: 10 10*3/uL — ABNORMAL HIGH (ref 1.7–7.7)
Neutrophils Relative %: 63 %
Platelets: 334 10*3/uL (ref 150–400)
RBC: 6.07 MIL/uL — ABNORMAL HIGH (ref 3.87–5.11)
RDW: 15.8 % — ABNORMAL HIGH (ref 11.5–15.5)
WBC: 15.8 10*3/uL — ABNORMAL HIGH (ref 4.0–10.5)
nRBC: 0 % (ref 0.0–0.2)

## 2022-09-12 LAB — BRAIN NATRIURETIC PEPTIDE: B Natriuretic Peptide: 46.8 pg/mL (ref 0.0–100.0)

## 2022-09-12 LAB — TROPONIN I (HIGH SENSITIVITY): Troponin I (High Sensitivity): 12 ng/L (ref <18)

## 2022-09-12 NOTE — ED Provider Triage Note (Signed)
Emergency Medicine Provider Triage Evaluation Note  MALAIYA PACZKOWSKI , a 46 y.o. female  was evaluated in triage.  Pt complains of shortness of breath, palpitations, and cough.  Patient was diagnosed with Covid 2 weeks ago.  Hx significant for CHF, type 2 DM, HTN, SVT.   Review of Systems  Positive: As above Negative: As above  Physical Exam  BP 125/70 (BP Location: Right Wrist)   Pulse 83   Temp 99.2 F (37.3 C)   Resp 14   LMP 08/23/2022   SpO2 98%  Gen:   Awake, no distress   Resp:  Normal effort, lungs clear to auscultation bilaterally MSK:   Moves extremities without difficulty  Other:  HR is irregular, auscultated to be in the 80s  Medical Decision Making  Medically screening exam initiated at 9:11 PM.  Appropriate orders placed.  [name] was informed that the remainder of the evaluation will be completed by another provider, this initial triage assessment does not replace that evaluation, and the importance of remaining in the ED until their evaluation is complete.     Pat Kocher, [place] 09/12/22 2114

## 2022-09-12 NOTE — ED Triage Notes (Signed)
Patient diagnosed with Covid 2 weeks ago at a local urgent care reports persistent productive cough with SOB and palpitations this evening .

## 2022-09-13 ENCOUNTER — Emergency Department (HOSPITAL_COMMUNITY): Payer: Medicaid Other

## 2022-09-13 LAB — TROPONIN I (HIGH SENSITIVITY): Troponin I (High Sensitivity): 11 ng/L (ref <18)

## 2022-09-13 MED ORDER — METOPROLOL SUCCINATE ER 25 MG PO TB24
50.0000 mg | ORAL_TABLET | Freq: Every day | ORAL | Status: DC
  Administered 2022-09-13: 50 mg via ORAL
  Filled 2022-09-13: qty 2

## 2022-09-13 MED ORDER — IOHEXOL 350 MG/ML SOLN
75.0000 mL | Freq: Once | INTRAVENOUS | Status: AC | PRN
  Administered 2022-09-13: 75 mL via INTRAVENOUS

## 2022-09-13 NOTE — ED Provider Notes (Signed)
[facility] EMERGENCY DEPARTMENT Provider Note   CSN: [id number] Arrival date & time: 09/12/22  1819     History  Chief Complaint  Patient presents with   Covid+ ; SOB/Palpitations     Vickie Graham is a 46 y.o. female.  Patient here with cough, shortness of breath, palpitations.  History of PACs on metoprolol, heart failure, diabetes.  She was diagnosed with COVID about 2 weeks ago.  Still does not feel like she is quite recovered.  She is having a lot of palpitations last night and today.  She takes metoprolol.  History of SVT as well.  Denies any ongoing fever, abdominal pain, nausea, vomiting, diarrhea.  The history is provided by the patient.       Home Medications Prior to Admission medications   Medication Sig Start Date End Date Taking? Authorizing Provider  acetaminophen (TYLENOL) 325 MG tablet Take 2 tablets (650 mg total) by mouth every 6 (six) hours as needed for mild pain. 02/20/21   Autry-Lott, Naaman Plummer, DO  empagliflozin (JARDIANCE) 10 MG TABS tablet Take 1 tablet (10 mg total) by mouth daily. 03/02/22   Milford, Maricela Bo, FNP  FLUoxetine (PROZAC) 40 MG capsule Take 1 capsule (40 mg total) by mouth daily. 03/30/22   Alcus Dad, MD  fluticasone (FLONASE) 50 MCG/ACT nasal spray Place 1 spray into both nostrils 2 (two) times daily as needed for allergies or rhinitis.    [provider]  furosemide (LASIX) 40 MG tablet Take 40 mg by mouth as needed for fluid or edema (AS DIRECTED).    [provider]  ibuprofen (ADVIL) 600 MG tablet Take 1 tablet (600 mg total) by mouth every 6 (six) hours as needed for up to 30 doses for mild pain or moderate pain. 06/04/22   Wyvonnia Dusky, MD  loratadine (CLARITIN) 10 MG tablet Take 10 mg by mouth daily as needed for allergies or rhinitis.    [provider]  metFORMIN (GLUCOPHAGE-XR) 500 MG 24 hr tablet Take 1 tablet (500 mg total) by mouth 2 (two) times daily. 03/30/22   Alcus Dad,  MD  metoprolol succinate (TOPROL-XL) 50 MG 24 hr tablet Take 1 tablet (50 mg total) by mouth every morning AND 0.5 tablets (25 mg total) at bedtime. 07/16/22   Milford, Maricela Bo, FNP  metoprolol tartrate (LOPRESSOR) 25 MG tablet Take 1 tablet when you have palpitations Patient taking differently: Take 25 mg by mouth as needed (AS DIRECTD for palpitations). 05/15/22   Bronson Curb, MD  oxyCODONE (ROXICODONE) 5 MG immediate release tablet Take 1 tablet (5 mg total) by mouth every 6 (six) hours as needed for up to 15 doses for severe pain. 06/04/22   Wyvonnia Dusky, MD  potassium chloride SA (KLOR-CON M) 20 MEQ tablet Take 1 tablet (20 mEq total) by mouth daily. As needed when lasix dose is taken    HF Fund Patient taking differently: Take 20 mEq by mouth daily as needed (when a dose of Furosemide is taken). 03/02/22   Milford, Maricela Bo, FNP  sacubitril-valsartan (ENTRESTO) 97-103 MG Take 1 tablet by mouth 2 (two) times daily. 07/17/22   Larey Dresser, MD  spironolactone (ALDACTONE) 25 MG tablet Take 1 tablet (25 mg total) by mouth daily. HF fund 03/02/22   Rafael Bihari, FNP      Allergies    Statins, Sulfa antibiotics, and Latex    Review of Systems   Review of Systems  Physical Exam Updated  Vital Signs BP 121/89   Pulse 85   Temp 98.1 F (36.7 C)   Resp 20   LMP 08/23/2022   SpO2 93%  Physical Exam Vitals and nursing note reviewed.  Constitutional:      General: She is not in acute distress.    Appearance: She is well-developed. She is not ill-appearing.  HENT:     Head: Normocephalic and atraumatic.     Nose: Nose normal.     Mouth/Throat:     Mouth: Mucous membranes are moist.  Eyes:     Extraocular Movements: Extraocular movements intact.     Conjunctiva/sclera: Conjunctivae normal.     Pupils: Pupils are equal, round, and reactive to light.  Cardiovascular:     Rate and Rhythm: Normal rate and regular rhythm.     Pulses: Normal pulses.     Heart sounds:  Normal heart sounds. No murmur heard. Pulmonary:     Effort: Pulmonary effort is normal. No respiratory distress.     Breath sounds: Normal breath sounds.  Abdominal:     Palpations: Abdomen is soft.     Tenderness: There is no abdominal tenderness.  Musculoskeletal:     Cervical back: Neck supple.  Skin:    General: Skin is warm and dry.     Capillary Refill: Capillary refill takes less than 2 seconds.  Neurological:     General: No focal deficit present.     Mental Status: She is alert.     ED Results / Procedures / Treatments   Labs (all labs ordered are listed, but only abnormal results are displayed) Labs Reviewed  COMPREHENSIVE METABOLIC PANEL - Abnormal; Notable for the following components:      Result Value   CO2 19 (*)    Glucose, Bld 111 (*)    BUN 22 (*)    All other components within normal limits  CBC WITH DIFFERENTIAL/PLATELET - Abnormal; Notable for the following components:   WBC 15.8 (*)    RBC 6.07 (*)    Hemoglobin 15.5 (*)    HCT 49.8 (*)    MCH 25.5 (*)    RDW 15.8 (*)    Neutro Abs 10.0 (*)    Monocytes Absolute 1.5 (*)    Abs Immature Granulocytes 0.25 (*)    All other components within normal limits  BRAIN NATRIURETIC PEPTIDE  TROPONIN I (HIGH SENSITIVITY)  TROPONIN I (HIGH SENSITIVITY)    EKG EKG Interpretation  Date/Time:  Thursday September 13 2022 11:00:18 EST Ventricular Rate:  105 PR Interval:  161 QRS Duration: 97 QT Interval:  384 QTC Calculation: 443 R Axis:   18 Text Interpretation: Sinus tachycardia Atrial premature complexes Low voltage, precordial leads Anteroseptal infarct, old Confirmed by Ronnald Nian, Lavinia Mcneely ([id number]) on 09/13/2022 11:04:04 AM  Radiology CT Angio Chest PE W and/or Wo Contrast  Result Date: 09/13/2022 CLINICAL DATA:  Pulmonary embolism suspected, high probability. Diagnosed with COVID 2 weeks ago. Productive cough with shortness of breath. EXAM: CT ANGIOGRAPHY CHEST WITH CONTRAST TECHNIQUE: Multidetector CT  imaging of the chest was performed using the standard protocol during bolus administration of intravenous contrast. Multiplanar CT image reconstructions and MIPs were obtained to evaluate the vascular anatomy. RADIATION DOSE REDUCTION: This exam was performed according to the departmental dose-optimization program which includes automated exposure control, adjustment of the mA and/or kV according to patient size and/or use of iterative reconstruction technique. CONTRAST:  59m OMNIPAQUE IOHEXOL 350 MG/ML SOLN COMPARISON:  Radiographs 09/12/2022.  CT 02/08/2021. FINDINGS: Cardiovascular: The  pulmonary arteries are well opacified with contrast to the level of the subsegmental branches. There is no evidence of acute pulmonary embolism. No significant systemic arterial abnormalities are identified. The heart size is normal. There is no pericardial effusion. Mediastinum/Nodes: There are no enlarged mediastinal, hilar or axillary lymph nodes.Small hiatal hernia. The thyroid gland and trachea appear unremarkable. Lungs/Pleura: No pleural effusion or pneumothorax. The lungs appear clear, without inflammatory changes or suspicious nodules. Upper abdomen: No acute findings are seen within the visualized upper abdomen. The liver demonstrates low density consistent with steatosis. Musculoskeletal/Chest wall: There is no chest wall mass or suspicious osseous finding. Multilevel spondylosis. Review of the MIP images confirms the above findings. IMPRESSION: 1. No evidence of acute pulmonary embolism or other acute chest findings. 2. Hepatic steatosis. 3. Small hiatal hernia. Electronically Signed   By: Richardean Sale M.D.   On: 09/13/2022 14:28   DG Chest 2 View  Result Date: 09/12/2022 CLINICAL DATA:  Shortness of breath EXAM: CHEST - 2 VIEW COMPARISON:  06/13/2021 FINDINGS: Stable cardiomediastinal silhouette. Chronic bronchitic changes similar to prior. No focal consolidation, pleural effusion, or pneumothorax. No acute  osseous abnormality. IMPRESSION: Chronic bronchitic changes.  No focal pneumonia. Electronically Signed   By: Placido Sou M.D.   On: 09/12/2022 21:33    Procedures Procedures    Medications Ordered in ED Medications  metoprolol succinate (TOPROL-XL) 24 hr tablet 50 mg (50 mg Oral Given 09/13/22 1139)  iohexol (OMNIPAQUE) 350 MG/ML injection 75 mL (75 mLs Intravenous Contrast Given 09/13/22 1358)    ED Course/ Medical Decision Making/ A&P                           Medical Decision Making Amount and/or Complexity of Data Reviewed Radiology: ordered.  Risk Prescription drug management.   [name] is here with shortness of breath and palpitations.  Recent COVID infection.  History of heart failure, hypertension, diabetes, SVT/PAC.  Vital signs overall unremarkable.  No fever.  EKG per my review shows sinus rhythm with PACs.  This appears to be consistent with some old EKGs.  This seems like she is on metoprolol as well as heart failure history.  Differential diagnosis is possibly ongoing COVID related symptoms, pneumonia, PE, ACS, CHF.  Will get lab work including CBC, BMP, troponin, BNP, chest x-ray, CT scan of the chest.  Per my review and interpretation labs, troponin unremarkable.  Doubt ACS.  BNP within normal limits.  Chest x-ray with no obvious pneumonia.  No obvious volume overload.  Have no concern for heart failure exacerbation.  No significant anemia or electrolyte abnormality or kidney injury otherwise.  He does have a mild leukocytosis.  Will get a CT scan to further evaluate for pneumonia, PE.  Palpitations likely related to PACs.  Will give her her home dose of Lopressor as she has not had a for over a day since being in the waiting for a long time.  Patient overall with CT scan that shows no obvious pneumonia or PE.  Overall unremarkable scan.  Heart rate has been stable in the 70s and 80s.  Overall suspect slightly symptomatic PACs.  She already has EP follow-up in 2  weeks.  Discharged in good condition.  Understands return precautions.  This chart was dictated using voice recognition software.  Despite best efforts to proofread,  errors can occur which can change the documentation meaning.         Final Clinical  Impression(s) / ED Diagnoses Final diagnoses:  Palpitations    Rx / DC Orders ED Discharge Orders     None         Lennice Sites, DO 09/13/22 1444

## 2022-09-30 NOTE — Progress Notes (Unsigned)
Electrophysiology Office Note:    Date:  09/30/2022   ID:  Vickie Graham, DOB 03/13/1976, MRN [id number]  PCP:  Alcus Dad, MD  [facility] Cardiologist:  None  [facility] Electrophysiologist:  None   Referring MD: Larey Dresser, MD   Chief Complaint: Atrial tachycardia  History of Present Illness:    Vickie Graham is a 47 y.o. female who presents for an evaluation of palpitations and history of atrial tachycardia at the request of Dr. Aundra Dubin. Their medical history includes chronic systolic heart failure, diabetes, and obesity.  The patient was in the emergency department in December with palpitations.  Her palpitations had increased in the setting of recent COVID infection.  She was previously treated with amiodarone which helped suppress the palpitations and also helped her ejection fraction.     Past Medical History:  Diagnosis Date   ADHD (attention deficit hyperactivity disorder)    diagnosed at [facility] clinic.   Allergy    Anxiety    Social anxiety, generalized anxiety disorder.   CHF (congestive heart failure) (HCC)    Depression    Diabetes mellitus without complication (HCC)    HFrEF (heart failure with reduced ejection fraction) (HCC)    Hypertension    SVT (supraventricular tachycardia)     Past Surgical History:  Procedure Laterality Date   ORIF ELBOW FRACTURE Left 06/18/2022   Procedure: OPEN REDUCTION INTERNAL FIXATION (ORIF) left ELBOW/OLECRANON FRACTURE/dislocation and repair as indicated bone and ligament;  Surgeon: Iran Planas, MD;  Location: [place];  Service: Orthopedics;  Laterality: Left;  188mn regional with iv sedation   RIGHT/LEFT HEART CATH AND CORONARY ANGIOGRAPHY N/A 02/16/2021   Procedure: RIGHT/LEFT HEART CATH AND CORONARY ANGIOGRAPHY;  Surgeon: MLarey Dresser MD;  [place] [facility];  Service: Cardiovascular;  Laterality: N/A;    Current Medications: No outpatient medications have been marked as taking for the 10/01/22  encounter (Appointment) with LVickie Epley MD.     Allergies:   Statins, Sulfa antibiotics, and Latex   Social History   Socioeconomic History   Marital status: Married    Spouse name: Not on file   Number of children: Not on file   Years of education: Not on file   Highest education level: Not on file  Occupational History   Not on file  Tobacco Use   Smoking status: Former    Types: Cigarettes    Quit date: 01/12/2010    Years since quitting: 12.7   Smokeless tobacco: Never  Vaping Use   Vaping Use: Never used  Substance and Sexual Activity   Alcohol use: No   Drug use: No   Sexual activity: Not on file  Other Topics Concern   Not on file  Social History Narrative   Marital status: divorced and engaged since 2011.     Children: 2 children      Lives with fiance and two children ([phone])      Employment: childcare early development; 3s and 4s. ASurveyor, quantity     Tobacco: none      Alcohol: none      Drugs; None      Exercise: sporadic.           Social Determinants of Health   Financial Resource Strain: Medium Risk (02/15/2021)   Overall Financial Resource Strain (CARDIA)    Difficulty of Paying Living Expenses: Somewhat hard  Food Insecurity: No Food Insecurity (03/02/2021)   Hunger Vital Sign  Worried About Charity fundraiser in the Last Year: Never true    [place] in the Last Year: Never true  Transportation Needs: No Transportation Needs (02/15/2021)   PRAPARE - Hydrologist (Medical): No    Lack of Transportation (Non-Medical): No  Physical Activity: Not on file  Stress: Not on file  Social Connections: Not on file     Family History: The patient's family history is not on file.  ROS:   Please see the history of present illness.    All other systems reviewed and are negative.  EKGs/Labs/Other Studies Reviewed:    The following studies were reviewed today:  June 26, 2021 echo shows EF 40 to  45% RV normal Trivial MR  February 17, 2021 cardiac MRI EF 21 Moderate RV systolic dysfunction  September 12, 2022 EKG shows sinus rhythm with frequent PACs.    Recent Labs: 05/15/2022: Magnesium 2.1; TSH 0.409 09/12/2022: ALT 26; B Natriuretic Peptide 46.8; BUN 22; Creatinine, Ser 0.76; Hemoglobin 15.5; Platelets 334; Potassium 3.9; Sodium 136  Recent Lipid Panel    Component Value Date/Time   CHOL 232 (H) 03/02/2022 0859   CHOL 152 04/17/2021 0844   TRIG 330 (H) 03/02/2022 0859   HDL 29 (L) 03/02/2022 0859   HDL 30 (L) 04/17/2021 0844   CHOLHDL 8.0 03/02/2022 0859   VLDL 66 (H) 03/02/2022 0859   LDLCALC 137 (H) 03/02/2022 0859   LDLCALC 77 04/17/2021 0844   LDLDIRECT 68.0 03/29/2021 1315    Physical Exam:    VS:  LMP 08/23/2022     Wt Readings from Last 3 Encounters:  06/18/22 256 lb (116.1 kg)  06/04/22 255 lb (115.7 kg)  05/15/22 262 lb (118.8 kg)     GEN: *** Well nourished, well developed in no acute distress CARDIAC: ***RRR, no murmurs, rubs, gallops RESPIRATORY:  Clear to auscultation without rales, wheezing or rhonchi  PSYCHIATRIC:  Normal affect       ASSESSMENT:    1. Atrial tachycardia   2. HFrEF (heart failure with reduced ejection fraction) ([place])   3. Morbid obesity ([place])    PLAN:    In order of problems listed above:  #History of atrial tachycardia EKGs demonstrate sinus with frequent PACs.  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  She was previously treated with amiodarone but this was stopped in the past given her young age.  I discussed treatment options with the patient during today's visit including antiarrhythmic drug therapy and catheter ablation.  Given her history of systolic heart failure, class Ic agents, Multaq and sotalol are not ideal.  Tikosyn could be considered.  Her young age is not ideal for amiodarone.    Her weight remains an issue for catheter ablation although I do not think it is  prohibitive.***   I would like to start with her wearing a ZIO monitor to quantify her atrial ectopy burden.  If there is a significant burden, could consider either Tikosyn or catheter ablation.  I will plan to see her back after the monitor has resulted.  #Chronic systolic heart failure Has seen Dr. Aundra Dubin in the past NYHA class II.  Warm and dry on exam. EF last measured at 40 to 45%. Continue spironolactone, Entresto, metoprolol, Lasix and Jardiance.  Follow-up in 6 to 8 weeks after she wears a 2-week ZIO monitor.  Medication Adjustments/Labs and Tests Ordered: Current medicines are reviewed at length with the  patient today.  Concerns regarding medicines are outlined above.  No orders of the defined types were placed in this encounter.  No orders of the defined types were placed in this encounter.    Signed, Hilton Cork. Quentin Ore, MD, [facility], [facility] 09/30/2022 6:22 PM    Electrophysiology  [facility]

## 2022-10-01 ENCOUNTER — Encounter: Payer: Self-pay | Admitting: Cardiology

## 2022-10-01 ENCOUNTER — Ambulatory Visit: Payer: Medicaid Other | Attending: Cardiology | Admitting: Cardiology

## 2022-10-01 ENCOUNTER — Ambulatory Visit: Payer: Medicaid Other | Attending: Cardiology

## 2022-10-01 VITALS — BP 118/62 | HR 75 | Ht 60.0 in | Wt 256.0 lb

## 2022-10-01 DIAGNOSIS — I5020 Unspecified systolic (congestive) heart failure: Secondary | ICD-10-CM

## 2022-10-01 DIAGNOSIS — I4719 Other supraventricular tachycardia: Secondary | ICD-10-CM | POA: Diagnosis not present

## 2022-10-01 MED ORDER — METOPROLOL SUCCINATE ER 50 MG PO TB24: 50.0000 mg | ORAL_TABLET | Freq: Two times a day (BID) | ORAL | 1 refills | Status: DC

## 2022-10-01 NOTE — Patient Instructions (Signed)
Medication Instructions:  Your physician has recommended you make the following change in your medication:  INCREASE Metoprolol Succinate (Toprol) to 50 mg twice daily  *If you need a refill on your cardiac medications before your next appointment, please call your pharmacy*   Lab Work: None ordered   Testing/Procedures:                           [place] Monitor Instructions  Your physician has requested you wear a ZIO patch monitor for 14 days.  This is a single patch monitor. [employer] supplies one patch monitor per enrollment. Additional stickers are not available. Please do not apply patch if you will be having a Nuclear Stress Test,  Echocardiogram, Cardiac CT, MRI, or Chest Xray during the period you would be wearing the  monitor. The patch cannot be worn during these tests. You cannot remove and re-apply the  ZIO XT patch monitor.  Your ZIO patch monitor will be mailed 3 day USPS to your address on file. It may take 3-5 days  to receive your monitor after you have been enrolled.  Once you have received your monitor, please review the enclosed instructions. Your monitor  has already been registered assigning a specific monitor serial # to you.  Billing and Patient Assistance Program Information  We have supplied [employer] with any of your insurance information on file for billing purposes. [employer] offers a sliding scale Patient Assistance Program for patients that do not have  insurance, or whose insurance does not completely cover the cost of the ZIO monitor.  You must apply for the Patient Assistance Program to qualify for this discounted rate.  To apply, please call [employer] at [phone], select option 4, select option 2, ask to apply for  Patient Assistance Program. Theodore Demark will ask your household income, and how many people  are in your household. They will quote your out-of-pocket cost based on that information.  [employer] will also be able to set up a 86-month  interest-free payment plan if needed.  Applying the monitor   Shave hair from upper left chest.  Hold abrader disc by orange tab. Rub abrader in 40 strokes over the upper left chest as  indicated in your monitor instructions.  Clean area with 4 enclosed alcohol pads. Let dry.  Apply patch as indicated in monitor instructions. Patch will be placed under collarbone on left  side of chest with arrow pointing upward.  Rub patch adhesive wings for 2 minutes. Remove white label marked "1". Remove the white  label marked "2". Rub patch adhesive wings for 2 additional minutes.  While looking in a mirror, press and release button in center of patch. A small green light will  flash 3-4 times. This will be your only indicator that the monitor has been turned on.  Do not shower for the first 24 hours. You may shower after the first 24 hours.  Press the button if you feel a symptom. You will hear a small click. Record Date, Time and  Symptom in the Patient Logbook.  When you are ready to remove the patch, follow instructions on the last 2 pages of Patient  Logbook. Stick patch monitor onto the last page of Patient Logbook.  Place Patient Logbook in the blue and white box. Use locking tab on box and tape box closed  securely. The blue and white box has prepaid postage on it. Please place it in the mailbox  as  soon as possible. Your physician should have your test results approximately 7 days after the  monitor has been mailed back to [facility].  Call [place] at [phone] if you have questions regarding  your ZIO XT patch monitor. Call them immediately if you see an orange light blinking on your  monitor.  If your monitor falls off in less than 4 days, contact our Monitor department at [phone].  If your monitor becomes loose or falls off after 4 days call [employer] at [phone] for  suggestions on securing your monitor    Follow-Up: At [facility], you and  your health needs are our priority.  As part of our continuing mission to provide you with exceptional heart care, we have created designated Provider Care Teams.  These Care Teams include your primary Cardiologist (physician) and Advanced Practice Providers (APPs -  Physician Assistants and Nurse Practitioners) who all work together to provide you with the care you need, when you need it.   Your next appointment:   6-8 week(s)  The format for your next appointment:   Virtual Visit   Provider:   Lars Mage, MD{ :1  Thank you for choosing [facility]!!   [phone]  Other Instructions

## 2022-10-01 NOTE — Progress Notes (Unsigned)
Enrolled for [employer] to mail a ZIO XT long term holter monitor to the patients address on file.  

## 2022-10-01 NOTE — Progress Notes (Signed)
Electrophysiology Office Note:    Date:  10/01/2022   ID:  Vickie Graham, DOB 1976/05/14, MRN [id number]  PCP:  Alcus Dad, MD  [facility] Cardiologist:  None  [facility] Electrophysiologist:  Vickie Epley, MD   Referring MD: Larey Dresser, MD   Chief Complaint: Atrial tachycardia  History of Present Illness:    Vickie Graham is a 47 y.o. female who presents for an evaluation of palpitations and history of atrial tachycardia at the request of Dr. Aundra Dubin. Their medical history includes chronic systolic heart failure, diabetes, and obesity.  The patient was in the emergency department in December with palpitations.  Her palpitations had increased in the setting of recent COVID infection.  She was previously treated with amiodarone which helped suppress the palpitations and also helped her ejection fraction.  Today, she reports that she saw Dr. Algernon Huxley recently. Her recent ER visit in August was due to not being able to control her pulse around 11 am- 12 pm at work. During this episode, she was sitting and felt sudden felt warmth in her chest. Her pulse was 170 at the time. She tried to improve symptoms by sitting, but was unsuccessful. She reports some fluid in her sinuses.  She was on Amiodarone in past when first diagnosed with CHF. She discontinued this and increased metoprolol dosage. She reports that while on Metoprolol 25 MG, she noticed very rare palpitations.   She denies any  shortness of breath, or peripheral edema. No lightheadedness, headaches, syncope, orthopnea, or PND.     Past Medical History:  Diagnosis Date   ADHD (attention deficit hyperactivity disorder)    diagnosed at [facility] clinic.   Allergy    Anxiety    Social anxiety, generalized anxiety disorder.   CHF (congestive heart failure) (HCC)    Depression    Diabetes mellitus without complication (HCC)    HFrEF (heart failure with reduced ejection fraction) (HCC)    Hypertension    SVT  (supraventricular tachycardia)     Past Surgical History:  Procedure Laterality Date   ORIF ELBOW FRACTURE Left 06/18/2022   Procedure: OPEN REDUCTION INTERNAL FIXATION (ORIF) left ELBOW/OLECRANON FRACTURE/dislocation and repair as indicated bone and ligament;  Surgeon: Iran Planas, MD;  Location: [place];  Service: Orthopedics;  Laterality: Left;  156mn regional with iv sedation   RIGHT/LEFT HEART CATH AND CORONARY ANGIOGRAPHY N/A 02/16/2021   Procedure: RIGHT/LEFT HEART CATH AND CORONARY ANGIOGRAPHY;  Surgeon: MLarey Dresser MD;  [place] [facility];  Service: Cardiovascular;  Laterality: N/A;    Current Medications: Current Meds  Medication Sig   acetaminophen (TYLENOL) 325 MG tablet Take 2 tablets (650 mg total) by mouth every 6 (six) hours as needed for mild pain.   empagliflozin (JARDIANCE) 10 MG TABS tablet Take 1 tablet (10 mg total) by mouth daily.   FLUoxetine (PROZAC) 40 MG capsule Take 1 capsule (40 mg total) by mouth daily.   fluticasone (FLONASE) 50 MCG/ACT nasal spray Place 1 spray into both nostrils 2 (two) times daily as needed for allergies or rhinitis.   furosemide (LASIX) 40 MG tablet Take 40 mg by mouth as needed for fluid or edema (AS DIRECTED).   loratadine (CLARITIN) 10 MG tablet Take 10 mg by mouth daily as needed for allergies or rhinitis.   metFORMIN (GLUCOPHAGE-XR) 500 MG 24 hr tablet Take 1 tablet (500 mg total) by mouth 2 (two) times daily.   metoprolol succinate (TOPROL-XL) 50 MG 24 hr tablet Take  1 tablet (50 mg total) by mouth every morning AND 0.5 tablets (25 mg total) at bedtime.   metoprolol tartrate (LOPRESSOR) 25 MG tablet Take 1 tablet when you have palpitations (Patient taking differently: Take 25 mg by mouth as needed (AS DIRECTD for palpitations).)   oxyCODONE (ROXICODONE) 5 MG immediate release tablet Take 1 tablet (5 mg total) by mouth every 6 (six) hours as needed for up to 15 doses for severe pain.   potassium chloride SA (KLOR-CON M)  20 MEQ tablet Take 1 tablet (20 mEq total) by mouth daily. As needed when lasix dose is taken    HF Fund (Patient taking differently: Take 20 mEq by mouth daily as needed (when a dose of Furosemide is taken).)   sacubitril-valsartan (ENTRESTO) 97-103 MG Take 1 tablet by mouth 2 (two) times daily.   spironolactone (ALDACTONE) 25 MG tablet Take 1 tablet (25 mg total) by mouth daily. HF fund     Allergies:   Statins, Sulfa antibiotics, and Latex   Social History   Socioeconomic History   Marital status: Married    Spouse name: Not on file   Number of children: Not on file   Years of education: Not on file   Highest education level: Not on file  Occupational History   Not on file  Tobacco Use   Smoking status: Former    Types: Cigarettes    Quit date: 01/12/2010    Years since quitting: 12.7   Smokeless tobacco: Never  Vaping Use   Vaping Use: Never used  Substance and Sexual Activity   Alcohol use: No   Drug use: No   Sexual activity: Not on file  Other Topics Concern   Not on file  Social History Narrative   Marital status: divorced and engaged since 2011.     Children: 2 children      Lives with fiance and two children (68, 10)      Employment: childcare early development; 3s and 4s. Surveyor, quantity      Tobacco: none      Alcohol: none      Drugs; None      Exercise: sporadic.           Social Determinants of Health   Financial Resource Strain: Medium Risk (02/15/2021)   Overall Financial Resource Strain (CARDIA)    Difficulty of Paying Living Expenses: Somewhat hard  Food Insecurity: No Food Insecurity (03/02/2021)   Hunger Vital Sign    Worried About Running Out of Food in the Last Year: Never true    Ran Out of Food in the Last Year: Never true  Transportation Needs: No Transportation Needs (02/15/2021)   PRAPARE - Hydrologist (Medical): No    Lack of Transportation (Non-Medical): No  Physical Activity: Not on file  Stress: Not on  file  Social Connections: Not on file     Family History: The patient's family history is not on file.  ROS:   Please see the history of present illness.    (+) palpitations All other systems reviewed and are negative.  EKGs/Labs/Other Studies Reviewed:    The following studies were reviewed today:  June 26, 2021 echo shows EF 40 to 45% RV normal Trivial MR  February 17, 2021 cardiac MRI EF 21 Moderate RV systolic dysfunction  September 12, 2022 EKG shows sinus rhythm with frequent PACs.  EKG: EKG is personally reviewed. 10/01/2022: Sinus rhythm with frequent PACs  Recent Labs: 05/15/2022: Magnesium  2.1; TSH 0.409 09/12/2022: ALT 26; B Natriuretic Peptide 46.8; BUN 22; Creatinine, Ser 0.76; Hemoglobin 15.5; Platelets 334; Potassium 3.9; Sodium 136  Recent Lipid Panel    Component Value Date/Time   CHOL 232 (H) 03/02/2022 0859   CHOL 152 04/17/2021 0844   TRIG 330 (H) 03/02/2022 0859   HDL 29 (L) 03/02/2022 0859   HDL 30 (L) 04/17/2021 0844   CHOLHDL 8.0 03/02/2022 0859   VLDL 66 (H) 03/02/2022 0859   LDLCALC 137 (H) 03/02/2022 0859   LDLCALC 77 04/17/2021 0844   LDLDIRECT 68.0 03/29/2021 1315    Physical Exam:    VS:  BP 118/62   Pulse 75   Ht 5' (1.524 m)   Wt 256 lb (116.1 kg)   LMP 08/23/2022   SpO2 97%   BMI 50.00 kg/m     Wt Readings from Last 3 Encounters:  10/01/22 256 lb (116.1 kg)  06/18/22 256 lb (116.1 kg)  06/04/22 255 lb (115.7 kg)     GEN:  Well nourished, well developed in no acute distress.  Obese CARDIAC: Irregular rhythm, no murmurs, rubs, gallops RESPIRATORY:  Clear to auscultation without rales, wheezing or rhonchi  PSYCHIATRIC:  Normal affect       ASSESSMENT:    1. Atrial tachycardia   2. HFrEF (heart failure with reduced ejection fraction) ([place])   3. Morbid obesity ([place])    PLAN:    In order of problems listed above:  #History of atrial tachycardia EKGs demonstrate sinus with frequent PACs.  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  She was previously treated with amiodarone but this was stopped in the past given her young age.  I discussed treatment options with the patient during today's visit including antiarrhythmic drug therapy and catheter ablation.  Given her history of systolic heart failure, class Ic agents, Multaq and sotalol are not ideal.  Tikosyn could be considered.  Her young age is not ideal for amiodarone.    Her weight remains an issue for catheter ablation although I do not think it is prohibitive.  She is working on weight loss  I will have her increase her metoprolol succinate to 50 mg by mouth twice daily.  I would like to start with her wearing a ZIO monitor to quantify her atrial ectopy burden.  If there is a significant burden, could consider either Tikosyn or catheter ablation.  I will plan to see her back virtually after the monitor has resulted.  #Chronic systolic heart failure Has seen Dr. Aundra Dubin in the past NYHA class II.  Warm and dry on exam. EF last measured at 40 to 45%. Continue spironolactone, Entresto, metoprolol, Lasix and Jardiance.  Follow-up in 6 to 8 weeks after she wears a 2-week ZIO monitor.  Medication Adjustments/Labs and Tests Ordered: Current medicines are reviewed at length with the patient today.  Concerns regarding medicines are outlined above.  No orders of the defined types were placed in this encounter.  No orders of the defined types were placed in this encounter.   I,Mitra Faeizi,acting as a Education administrator for Vickie Epley, MD.,have documented all relevant documentation on the behalf of Vickie Epley, MD,as directed by  Vickie Epley, MD while in the presence of Vickie Epley, MD.  I, Vickie Epley, MD, have reviewed all documentation for this visit. The documentation on 10/01/22 for the exam, diagnosis, procedures, and orders are all accurate and complete.   Signed, Hilton Cork.  Quentin Ore, MD,  [facility], [facility] 10/01/2022 9:16 AM    Electrophysiology [facility]

## 2022-10-04 DIAGNOSIS — I4719 Other supraventricular tachycardia: Secondary | ICD-10-CM

## 2022-11-12 ENCOUNTER — Other Ambulatory Visit: Payer: Self-pay

## 2022-11-19 NOTE — Progress Notes (Unsigned)
   Virtual Visit via Video Note   Because of Vickie Graham's co-morbid illnesses, she is at least at moderate risk for complications without adequate follow up.  This format is felt to be most appropriate for this patient at this time.  All issues noted in this document were discussed and addressed.  A limited physical exam was performed with this format.  Please refer to the patient's chart for her consent to telehealth for [facility].  {Did you have to convert this VIDEO visit to AUDIO only?                     :[id number]    Date:  11/19/2022   ID:  Vickie Graham, DOB Apr 29, 1976, MRN [id number] The patient was identified using 2 identifiers.  Patient Location: Home Provider Location: Office/Clinic   PCP:  Alcus Dad, [place] Providers Cardiologist:  None Electrophysiologist:  Vickie Epley, Vickie Graham     Evaluation Performed:  Follow-Up Visit  History of Present Illness:    Vickie Graham is a 47 y.o. female with atrial tachycardia who I am seeing in follow-up.  I last saw her October 01, 2022.  At the last appointment I increased her metoprolol and had her wear a 2-week ZIO monitor.  Her ZIO monitor resulted February 10 and showed a 9.2% burden of supraventricular ectopy.  There were 1374 episodes of supraventricular tachycardia (likely atrial tachycardia) with the longest episode lasting 4 minutes and 24 seconds with an average heart rate of 118 bpm.    ROS:   Please see the history of present illness.    All other systems reviewed and are negative.       Objective:    Vital Signs:  There were no vitals taken for this visit.   VITAL SIGNS:  reviewed  ASSESSMENT & PLAN:    #Atrial tachycardia Frequent episodes.  High burden of supraventricular ectopy. Is associated with her chronic systolic heart failure and is thought to be a likely contributor.  Currently on metoprolol succinate 50 mg by mouth once daily.  ?  Catheter ablation versus  uptitration of metoprolol  #Chronic systolic heart failure NYHA class II.  Warm and dry on exam and by history.  Continue current medical therapy.     Time:   Today, I have spent *** minutes with the patient with telehealth technology discussing the above problems.      Signed, Vickie Epley, Vickie Graham  11/19/2022 8:56 PM    [place]

## 2022-11-20 ENCOUNTER — Ambulatory Visit: Payer: Medicaid Other | Attending: Cardiology | Admitting: Cardiology

## 2022-11-20 DIAGNOSIS — I5020 Unspecified systolic (congestive) heart failure: Secondary | ICD-10-CM

## 2022-11-20 DIAGNOSIS — I4719 Other supraventricular tachycardia: Secondary | ICD-10-CM

## 2022-11-20 NOTE — Patient Instructions (Signed)
Medication Instructions:  Your physician recommends that you continue on your current medications as directed. Please refer to the Current Medication list given to you today.  *If you need a refill on your cardiac medications before your next appointment, please call your pharmacy*  Follow-Up: At [facility], you and your health needs are our priority.  As part of our continuing mission to provide you with exceptional heart care, we have created designated Provider Care Teams.  These Care Teams include your primary Cardiologist (physician) and Advanced Practice Providers (APPs -  Physician Assistants and Nurse Practitioners) who all work together to provide you with the care you need, when you need it.  Your next appointment:   1 year(s)  Provider:   You will see one of the following Advanced Practice Providers on your designated Care Team:   Tommye Standard, Orlinda Blalock "Jonni Sanger" [place], [place] Mamie Levers, NP  Other Instructions You have been referred to the [facility]

## 2022-11-20 NOTE — Addendum Note (Signed)
Addended by: Bernestine Amass on: 11/20/2022 08:25 AM   Modules accepted: Orders

## 2022-12-10 ENCOUNTER — Other Ambulatory Visit: Payer: Self-pay | Admitting: Family Medicine

## 2022-12-10 DIAGNOSIS — F419 Anxiety disorder, unspecified: Secondary | ICD-10-CM

## 2022-12-27 ENCOUNTER — Encounter (HOSPITAL_COMMUNITY): Payer: Self-pay

## 2022-12-27 ENCOUNTER — Emergency Department (HOSPITAL_COMMUNITY): Payer: Medicaid Other

## 2022-12-27 ENCOUNTER — Other Ambulatory Visit: Payer: Self-pay

## 2022-12-27 ENCOUNTER — Emergency Department (HOSPITAL_COMMUNITY)
Admission: EM | Admit: 2022-12-27 | Discharge: 2022-12-27 | Disposition: A | Discharge status: Home / Self Care | Payer: Medicaid Other | Attending: Emergency Medicine | Admitting: Emergency Medicine

## 2022-12-27 DIAGNOSIS — R946 Abnormal results of thyroid function studies: Secondary | ICD-10-CM | POA: Diagnosis not present

## 2022-12-27 DIAGNOSIS — Z91040 Latex allergy status: Secondary | ICD-10-CM | POA: Diagnosis not present

## 2022-12-27 DIAGNOSIS — R5383 Other fatigue: Secondary | ICD-10-CM | POA: Diagnosis present

## 2022-12-27 DIAGNOSIS — I509 Heart failure, unspecified: Secondary | ICD-10-CM | POA: Diagnosis not present

## 2022-12-27 DIAGNOSIS — R7989 Other specified abnormal findings of blood chemistry: Secondary | ICD-10-CM

## 2022-12-27 LAB — TROPONIN I (HIGH SENSITIVITY)
Troponin I (High Sensitivity): 7 ng/L (ref <18)
Troponin I (High Sensitivity): 6 ng/L (ref <18)

## 2022-12-27 LAB — BASIC METABOLIC PANEL
Anion gap: 10 (ref 5–15)
BUN: 17 mg/dL (ref 6–20)
CO2: 20 mmol/L — ABNORMAL LOW (ref 22–32)
Calcium: 9.3 mg/dL (ref 8.9–10.3)
Chloride: 107 mmol/L (ref 98–111)
Creatinine, Ser: 0.61 mg/dL (ref 0.44–1.00)
GFR, Estimated: >60 mL/min (ref >60)
Glucose, Bld: 114 mg/dL — ABNORMAL HIGH (ref 70–99)
Potassium: 4.2 mmol/L (ref 3.5–5.1)
Sodium: 137 mmol/L (ref 135–145)

## 2022-12-27 LAB — CBC
HCT: 46.3 % — ABNORMAL HIGH (ref 36.0–46.0)
Hemoglobin: 14.9 g/dL (ref 12.0–15.0)
MCH: 25.9 pg — ABNORMAL LOW (ref 26.0–34.0)
MCHC: 32.2 g/dL (ref 30.0–36.0)
MCV: 80.5 fL (ref 80.0–100.0)
Platelets: 254 10*3/uL (ref 150–400)
RBC: 5.75 MIL/uL — ABNORMAL HIGH (ref 3.87–5.11)
RDW: 15.5 % (ref 11.5–15.5)
WBC: 12.1 10*3/uL — ABNORMAL HIGH (ref 4.0–10.5)
nRBC: 0 % (ref 0.0–0.2)

## 2022-12-27 LAB — TSH: TSH: <0.01 u[IU]/mL — ABNORMAL LOW (ref 0.350–4.500)

## 2022-12-27 LAB — I-STAT BETA HCG BLOOD, ED (MC, WL, AP ONLY): I-stat hCG, quantitative: <5 m[IU]/mL (ref <5)

## 2022-12-27 LAB — BRAIN NATRIURETIC PEPTIDE: B Natriuretic Peptide: 92.3 pg/mL (ref 0.0–100.0)

## 2022-12-27 NOTE — Discharge Instructions (Signed)
Continue all your medications like you are doing.  Your 1 thyroid test was abnormal today and some further tests are being done to see if you need to be on medication.

## 2022-12-27 NOTE — ED Triage Notes (Signed)
Pt was sent by PCP for abnormal EKG and hypoxia. Pt denies chest pain or SOB. Pt c/o fatigue and productive cough w/clear sputum. Pt taking z-pack for same.

## 2022-12-27 NOTE — ED Provider Notes (Signed)
[place] EMERGENCY DEPARTMENT AT [facility] Provider Note   CSN: [id number] Arrival date & time: 12/27/22  1011     History  Chief Complaint  Patient presents with   Abnormal ECG    Vickie Graham is a 47 y.o. female.  Patient is a 47 year old female with a history of atrial tachycardia, CHF with last EF of 40 to 45% on Entresto, Jardiance and spironolactone who is presenting today from urgent care due to extreme fatigue and there today they felt that she had an abnormal EKG and they were concerned and sent her here for further evaluation.  Patient reports that approximately 3 weeks ago she developed URI symptoms with cough congestion that persisted for a week and she saw urgent care on 12/17/2022.  At that time they felt that she may be developing pneumonia and started her on azithromycin and Augmentin.  She finished the azithromycin and took her last dose of Augmentin yesterday but reports initially the extreme fatigue that she was having improved with antibiotics but then it started to come back yesterday and she was having so much fatigue she did not think she could go to work.  She has not had any shortness of breath and even has paid attention with ambulating and has not noticed that she has felt winded like she was before when she was diagnosed with CHF.  She checks her weight daily and has not had any changes.  She has not had to take Lasix.  She has not had fever and denies any cough at this time.  The history is provided by the patient.       Home Medications Prior to Admission medications   Medication Sig Start Date End Date Taking? Authorizing Provider  acetaminophen (TYLENOL) 325 MG tablet Take 2 tablets (650 mg total) by mouth every 6 (six) hours as needed for mild pain. 02/20/21   Autry-Lott, Randa Evens, DO  empagliflozin (JARDIANCE) 10 MG TABS tablet Take 1 tablet (10 mg total) by mouth daily. 03/02/22   Jacklynn Ganong, FNP  FLUoxetine (PROZAC) 40 MG capsule Take 1  capsule by mouth once daily 12/10/22   Maury Dus, MD  fluticasone [facility]) 50 MCG/ACT nasal spray Place 1 spray into both nostrils 2 (two) times daily as needed for allergies or rhinitis.    [provider]  furosemide (LASIX) 40 MG tablet Take 40 mg by mouth as needed for fluid or edema (AS DIRECTED).    [provider]  ibuprofen (ADVIL) 600 MG tablet Take 1 tablet (600 mg total) by mouth every 6 (six) hours as needed for up to 30 doses for mild pain or moderate pain. Patient not taking: Reported on 10/01/2022 06/04/22   Terald Sleeper, MD  loratadine (CLARITIN) 10 MG tablet Take 10 mg by mouth daily as needed for allergies or rhinitis.    [provider]  metFORMIN (GLUCOPHAGE-XR) 500 MG 24 hr tablet Take 1 tablet (500 mg total) by mouth 2 (two) times daily. 03/30/22   Maury Dus, MD  metoprolol succinate (TOPROL XL) 50 MG 24 hr tablet Take 1 tablet (50 mg total) by mouth in the morning and at bedtime. Take with or immediately following a meal. 10/01/22   Lanier Prude, MD  metoprolol tartrate (LOPRESSOR) 25 MG tablet Take 1 tablet when you have palpitations Patient taking differently: Take 25 mg by mouth as needed (AS DIRECTD for palpitations). 05/15/22   Freddy Finner, MD  oxyCODONE (ROXICODONE) 5 MG immediate  release tablet Take 1 tablet (5 mg total) by mouth every 6 (six) hours as needed for up to 15 doses for severe pain. 06/04/22   Terald Sleeperrifan, Matthew J, MD  potassium chloride SA (KLOR-CON M) 20 MEQ tablet Take 1 tablet (20 mEq total) by mouth daily. As needed when lasix dose is taken    HF Fund Patient taking differently: Take 20 mEq by mouth daily as needed (when a dose of Furosemide is taken). 03/02/22   Milford, Anderson MaltaJessica M, FNP  sacubitril-valsartan (ENTRESTO) 97-103 MG Take 1 tablet by mouth 2 (two) times daily. 07/17/22   Laurey MoraleMcLean, Dalton S, MD  spironolactone (ALDACTONE) 25 MG tablet Take 1 tablet (25 mg total) by mouth daily. HF fund 03/02/22    Jacklynn GanongMilford, Jessica M, FNP      Allergies    Statins, Sulfa antibiotics, and Latex    Review of Systems   Review of Systems  Physical Exam Updated Vital Signs BP 117/65   Pulse 78   Temp 98 F (36.7 C)   Resp 18   Ht 5' (1.524 m)   Wt 116.1 kg   SpO2 96%   BMI 49.99 kg/m  Physical Exam Vitals and nursing note reviewed.  Constitutional:      General: She is not in acute distress.    Appearance: She is well-developed.  HENT:     Head: Normocephalic and atraumatic.  Eyes:     Pupils: Pupils are equal, round, and reactive to light.  Cardiovascular:     Rate and Rhythm: Normal rate and regular rhythm.     Heart sounds: No murmur heard.    No friction rub. Gallop present. S3 sounds present.  Pulmonary:     Effort: Pulmonary effort is normal.     Breath sounds: Normal breath sounds. No wheezing or rales.  Abdominal:     General: Bowel sounds are normal. There is no distension.     Palpations: Abdomen is soft.     Tenderness: There is no abdominal tenderness. There is no guarding or rebound.  Musculoskeletal:        General: No tenderness. Normal range of motion.     Comments: No edema  Skin:    General: Skin is warm and dry.     Findings: No rash.  Neurological:     Mental Status: She is alert and oriented to person, place, and time. Mental status is at baseline.     Cranial Nerves: No cranial nerve deficit.  Psychiatric:        Behavior: Behavior normal.     ED Results / Procedures / Treatments   Labs (all labs ordered are listed, but only abnormal results are displayed) Labs Reviewed  BASIC METABOLIC PANEL - Abnormal; Notable for the following components:      Result Value   CO2 20 (*)    Glucose, Bld 114 (*)    All other components within normal limits  CBC - Abnormal; Notable for the following components:   WBC 12.1 (*)    RBC 5.75 (*)    HCT 46.3 (*)    MCH 25.9 (*)    All other components within normal limits  TSH - Abnormal; Notable for the following  components:   TSH <0.010 (*)    All other components within normal limits  BRAIN NATRIURETIC PEPTIDE  T4  T3  I-STAT BETA HCG BLOOD, ED ([facility], [facility], [facility] ONLY)  TROPONIN I (HIGH SENSITIVITY)  TROPONIN I (HIGH SENSITIVITY)    EKG EKG  Interpretation  Date/Time:  Thursday December 27 2022 10:23:16 EDT Ventricular Rate:  91 PR Interval:  182 QRS Duration: 80 QT Interval:  356 QTC Calculation: 437 R Axis:   67 Text Interpretation: Sinus rhythm with Premature supraventricular complexes Low voltage QRS Cannot rule out Anteroseptal infarct , age undetermined Abnormal ECG When compared with ECG of 13-Sep-2022 11:00, PREVIOUS ECG IS PRESENT Confirmed by Gwyneth Sprout ([id number]) on 12/27/2022 12:55:27 PM  Radiology DG Chest 2 View  Result Date: 12/27/2022 CLINICAL DATA:  Fatigue EXAM: CHEST - 2 VIEW COMPARISON:  Chest x-ray dated September 11, 2022 FINDINGS: The heart size and mediastinal contours are within normal limits. Both lungs are clear. The visualized skeletal structures are unremarkable. IMPRESSION: No active cardiopulmonary disease. Electronically Signed   By: Allegra Lai M.D.   On: 12/27/2022 10:51    Procedures Procedures    Medications Ordered in ED Medications - No data to display  ED Course/ Medical Decision Making/ A&P                             Medical Decision Making Amount and/or Complexity of Data Reviewed External Data Reviewed: notes.    Details: Urgent care and cardiology Labs: ordered. Decision-making details documented in ED Course. Radiology: ordered and independent interpretation performed. Decision-making details documented in ED Course. ECG/medicine tests: ordered and independent interpretation performed. Decision-making details documented in ED Course.   Pt with multiple medical problems and comorbidities and presenting today with a complaint that caries a high risk for morbidity and mortality.  Here today because she was sent from urgent care and having  significant fatigue.  Patient does have significant heart history but has been compliant with her medications.  She has had no recent weight gain and has not changed any of her meds.  She took the last day of Augmentin yesterday.  Concern for heart strain, CHF exacerbation, pneumonia, possible response to recent antibiotic use and being ill.  Patient has had no shortness of breath and is PERC neg. On exam patient does not have significant findings for CHF exacerbation.  I independently interpreted patient's EKG and labs.  EKG today does not show any new changes from her prior EKGs.  She is not having any evidence of dysrhythmia and abnormal rhythm or extra beats.  CBC without evidence of anemia, CMP within normal limits, troponin is normal at 7.  Will check TSH and BNP.  I have independently visualized and interpreted pt's images today.  Chest x-ray within normal limits.   3:48 PM Patient's BNP and troponins are negative.  TSH today is undetectable.  T3 and T4 were sent.  She otherwise remains hemodynamically stable.  Patient does have a PCP she can follow-up with.  A note was sent to her PCP so that this can be followed up on.  Patient will also follow-up with them tomorrow.  At this time feel that patient is stable for discharge home to continue her current medications.  Sats have remained greater than 93% the entire stay.  Patient also has a pulse ox at home that she was using to monitor and it was 93 at home as well.  There is been no evidence of hypoxia.         Final Clinical Impression(s) / ED Diagnoses Final diagnoses:  Other fatigue  Abnormal TSH    Rx / DC Orders ED Discharge Orders     None  Gwyneth Sprout, MD 12/27/22 [phone]

## 2023-02-04 ENCOUNTER — Other Ambulatory Visit (HOSPITAL_COMMUNITY): Payer: Self-pay

## 2023-03-06 ENCOUNTER — Other Ambulatory Visit (HOSPITAL_COMMUNITY): Payer: Self-pay | Admitting: Family Medicine

## 2023-03-11 ENCOUNTER — Other Ambulatory Visit: Payer: Self-pay | Admitting: Family Medicine

## 2023-03-11 DIAGNOSIS — F419 Anxiety disorder, unspecified: Secondary | ICD-10-CM

## 2023-03-21 ENCOUNTER — Other Ambulatory Visit (HOSPITAL_COMMUNITY): Payer: Self-pay | Admitting: Family Medicine

## 2023-03-26 ENCOUNTER — Encounter (INDEPENDENT_AMBULATORY_CARE_PROVIDER_SITE_OTHER): Payer: Medicaid Other | Admitting: Physician Assistant

## 2023-04-16 ENCOUNTER — Other Ambulatory Visit (HOSPITAL_COMMUNITY): Payer: Self-pay | Admitting: Cardiology

## 2023-04-27 ENCOUNTER — Other Ambulatory Visit (HOSPITAL_COMMUNITY): Payer: Self-pay | Admitting: Family Medicine

## 2023-05-16 ENCOUNTER — Other Ambulatory Visit (HOSPITAL_COMMUNITY): Payer: Self-pay | Admitting: Cardiology

## 2023-05-30 ENCOUNTER — Other Ambulatory Visit: Payer: Self-pay | Admitting: *Deleted

## 2023-05-30 DIAGNOSIS — E119 Type 2 diabetes mellitus without complications: Secondary | ICD-10-CM

## 2023-06-03 ENCOUNTER — Other Ambulatory Visit (HOSPITAL_COMMUNITY): Payer: Self-pay | Admitting: Cardiology

## 2023-06-04 MED ORDER — METFORMIN HCL ER 500 MG PO TB24: 500.0000 mg | ORAL_TABLET | Freq: Two times a day (BID) | ORAL | 0 refills | Status: DC

## 2023-06-04 NOTE — Telephone Encounter (Signed)
Patient to be seen before next refill. Last HgbA1c 6.1 in April 2023.

## 2023-06-09 ENCOUNTER — Other Ambulatory Visit (HOSPITAL_COMMUNITY): Payer: Self-pay | Admitting: Family Medicine

## 2023-06-13 NOTE — Telephone Encounter (Signed)
Called and lvm for patient to call back and schedule

## 2023-06-16 ENCOUNTER — Other Ambulatory Visit (HOSPITAL_COMMUNITY): Payer: Self-pay | Admitting: Cardiology

## 2023-06-18 ENCOUNTER — Other Ambulatory Visit: Payer: Self-pay

## 2023-06-18 DIAGNOSIS — F419 Anxiety disorder, unspecified: Secondary | ICD-10-CM

## 2023-06-19 MED ORDER — FLUOXETINE HCL 40 MG PO CAPS: 40.0000 mg | ORAL_CAPSULE | Freq: Every day | ORAL | 0 refills | Status: DC

## 2023-06-19 NOTE — Telephone Encounter (Signed)
Patient to be seen in clinic before next refill.

## 2023-06-25 NOTE — Telephone Encounter (Signed)
Called and lvm for patient to call back and schedule appointment.   Thanks [employer]

## 2023-06-27 ENCOUNTER — Other Ambulatory Visit (HOSPITAL_COMMUNITY): Payer: Self-pay

## 2023-06-27 ENCOUNTER — Telehealth (HOSPITAL_COMMUNITY): Payer: Self-pay | Admitting: Pharmacy Technician

## 2023-06-27 NOTE — Telephone Encounter (Signed)
Advanced Heart Failure Patient Visual merchandiser for [employer] assistance. The patient is currently insured with a managed medicaid plan. Test claim shows refill too soon. Will not seek renewal at this time.  Archer Asa, CPhT

## 2023-07-09 ENCOUNTER — Other Ambulatory Visit (HOSPITAL_COMMUNITY): Payer: Self-pay | Admitting: Cardiology

## 2023-07-09 ENCOUNTER — Other Ambulatory Visit: Payer: Self-pay | Admitting: Family Medicine

## 2023-07-09 DIAGNOSIS — E119 Type 2 diabetes mellitus without complications: Secondary | ICD-10-CM

## 2023-07-12 ENCOUNTER — Other Ambulatory Visit (HOSPITAL_COMMUNITY): Payer: Self-pay | Admitting: Family Medicine

## 2023-07-12 NOTE — Telephone Encounter (Signed)
Overdue for follow up Appt made 10/30 1015 Meds refilled

## 2023-07-17 ENCOUNTER — Ambulatory Visit: Payer: Medicaid Other | Attending: Cardiology | Admitting: Cardiology

## 2023-07-17 ENCOUNTER — Telehealth: Payer: Self-pay | Admitting: Pharmacist

## 2023-07-17 ENCOUNTER — Other Ambulatory Visit (HOSPITAL_COMMUNITY): Payer: Self-pay | Admitting: Cardiology

## 2023-07-17 ENCOUNTER — Other Ambulatory Visit: Payer: Self-pay | Admitting: Cardiology

## 2023-07-17 ENCOUNTER — Telehealth: Payer: Self-pay

## 2023-07-17 ENCOUNTER — Other Ambulatory Visit (HOSPITAL_COMMUNITY): Payer: Self-pay

## 2023-07-17 VITALS — BP 102/61 | HR 78 | Wt 273.0 lb

## 2023-07-17 DIAGNOSIS — I5020 Unspecified systolic (congestive) heart failure: Secondary | ICD-10-CM

## 2023-07-17 MED ORDER — FUROSEMIDE 40 MG PO TABS: 40.0000 mg | ORAL_TABLET | ORAL | 3 refills | Status: AC | PRN

## 2023-07-17 MED ORDER — METOPROLOL SUCCINATE ER 50 MG PO TB24: 50.0000 mg | ORAL_TABLET | Freq: Two times a day (BID) | ORAL | 3 refills | Status: DC

## 2023-07-17 MED ORDER — ENTRESTO 97-103 MG PO TABS: 1.0000 | ORAL_TABLET | Freq: Two times a day (BID) | ORAL | 6 refills | Status: DC

## 2023-07-17 MED ORDER — EMPAGLIFLOZIN 10 MG PO TABS: 10.0000 mg | ORAL_TABLET | Freq: Every day | ORAL | 11 refills | Status: DC

## 2023-07-17 MED ORDER — MOUNJARO 2.5 MG/0.5ML ~~LOC~~ SOAJ: 2.5000 mg | SUBCUTANEOUS | 0 refills | Status: DC

## 2023-07-17 MED ORDER — SPIRONOLACTONE 25 MG PO TABS: 25.0000 mg | ORAL_TABLET | Freq: Every day | ORAL | 6 refills | Status: DC

## 2023-07-17 MED ORDER — POTASSIUM CHLORIDE CRYS ER 20 MEQ PO TBCR: 20.0000 meq | EXTENDED_RELEASE_TABLET | Freq: Every day | ORAL | 3 refills | Status: DC

## 2023-07-17 NOTE — Patient Instructions (Signed)
Medication Changes:  Our pharmacist will talk to you about Mounjaro.   Lab Work:  Labs done today, your results will be available in MyChart, we will contact you for abnormal readings.   Testing/Procedures:  Your physician has requested that you have an echocardiogram. Echocardiography is a painless test that uses sound waves to create images of your heart. It provides your doctor with information about the size and shape of your heart and how well your heart's chambers and valves are working. This procedure takes approximately one hour. There are no restrictions for this procedure. Please do NOT wear cologne, perfume, aftershave, or lotions (deodorant is allowed). Please arrive 15 minutes prior to your appointment time.   [facility] 07/18/23 @8 :45 am Please call if you need to reschedule: [phone]    Special Instructions // Education:  Do the following things EVERYDAY: Weigh yourself in the morning before breakfast. Write it down and keep it in a log. Take your medicines as prescribed Eat low salt foods--Limit salt (sodium) to 2000 mg per day.  Stay as active as you can everyday Limit all fluids for the day to less than 2 liters   Follow-Up in: 2 months in [place] location. Please call [phone] to schedule your follow up appointment.     If you have any questions or concerns before your next appointment please send [place] a message through [place] or call our office at [phone] Monday-Friday 8 am-5 pm.   If you have an urgent need after hours on the weekend please call your Primary Cardiologist or the [facility] in [place] at [phone].   At the [facility], you and your health needs are our priority. We have a designated team specialized in the treatment of Heart Failure. This Care Team includes your primary Heart Failure Specialized Cardiologist (physician), Advanced Practice Providers (APPs- Physician  Assistants and Nurse Practitioners), and Pharmacist who all work together to provide you with the care you need, when you need it.   You may see any of the following providers on your designated Care Team at your next follow up:  Dr. Arvilla Meres Dr. Marca Ancona Dr. Dorthula Nettles Dr. Theresia Bough Tonye Becket, NP Robbie Lis, [place] [address] [place], [place] Brynda Peon, NP [place] Lee, NP Clarisa Kindred, NP Enos Fling, PharmD

## 2023-07-17 NOTE — Telephone Encounter (Signed)
PA approved for [facility] for $4. Prescription sent to [facility]. Patient educated on administration via telephone. Patient voiced understanding.

## 2023-07-17 NOTE — Progress Notes (Signed)
ADVANCED HF CLINIC NOTE  Primary Care: Dr. Maury Dus Primary HF Cardiologist: Dr. Shirlee Latch  HPI: 47 y.o. female w/ h/o systolic HF/nonischemic CM, obesity, ADHD, anxiety, depression, hypothyroidism, GERD, PACs, DM2.  Presented to ED on 02/13/21 w/ complaints of 3 week h/o progressive DOE, LEE and abdominal fullness. Found to be in acute CHF. BNP 1388. CXR w/ mild congestive CHF w/ interstitial edema. EKG showed ST 120 bpm. HS trop 48>>50.  TSH 13.416. Free T4 WNL. Chest CT negative for PE. She was admitted for CHF and started on IV Lasix. Echo showed biventricular dysfunction. LVEF severely reduced 20-25% w/ global HK. Mild LVH. RV moderately reduced w/ estimated RVSP 36 mmHg. Also found to have new diagnosis of T2D, Hgb A1c 11. She responded well to diuresis and was transitioned to oral Lasix and discharged on GDMT.  Seen in ED on 06/13/21 with fatigue. Fluoxetine and metformin recently increased at PCP visit 09/16. Chest x-ray with evidence of mild interstitial edema. It was recommended to increase lasix to 80 mg daily for 3 days. Declined COVID test as had negative result at home. No labs or ECG done.    Echo in 10/22 showed EF 40-45%, mild LV enlargement, normal RV, normal IVC.  Zio monitor in 2/24 showed 9.2% PACs, 1374 AT runs (longest 4 minutes)  She returns today for followup of CHF.  No long runs of palpitations recently.  She has occasional palpitations but not prolonged or severely symptomatic. No exertional dyspnea or chest pain.  She takes Lasix about twice a week when she notices ankle edema or abdominal distention.  No orthopnea/PND.   ECG (personally reviewed): NSR, poor RWP  Labs (9/22): TSH mildly elevated, K 4, creatinine 1.07, LFTs normal Labs (12/22): K 3.5, creatinine 0.86 Labs (4/24): BNP 92, TSH < 0.010  PMH: 1. Type 2 diabetes 2. ADHD 3. HTN 4. Depression 5. GERD 6. Obesity 7. Chronic systolic CHF:  Nonischemic cardiomyopathy.  - Echo (5/22) with EF  20-25%, global HK, moderately decreased RV systolic function.  - RHC/LHC: No significant CAD; mean RA 6, PA 53/20 mean 30, mean PCWP 20, CI (Fick) 2.74, PVR 1.7 WU.  - Cardiac MRI (6/22): Mildly dilated LV with EF 21%, RV EF 30%, no definite LGE (difficult images).  - Echo (10/22) with EF 40-45%, mild LV enlargement, normal RV.  8. Hypothyroidism 9. Atrial tachycardia - Zio (2/24): 9.2% PACs, 1374 AT runs (longest 4 minutes) 10. Hyperlipidemia.   ROS: All systems reviewed and negative except as per HPI.   FH: No family history of cardiomyopathy.    Current Outpatient Medications  Medication Sig Dispense Refill   acetaminophen (TYLENOL) 325 MG tablet Take 2 tablets (650 mg total) by mouth every 6 (six) hours as needed for mild pain.     FLUoxetine (PROZAC) 40 MG capsule Take 1 capsule (40 mg total) by mouth daily. 90 capsule 0   fluticasone (FLONASE) 50 MCG/ACT nasal spray Place 1 spray into both nostrils 2 (two) times daily as needed for allergies or rhinitis.     loratadine (CLARITIN) 10 MG tablet Take 10 mg by mouth daily as needed for allergies or rhinitis.     metFORMIN (GLUCOPHAGE-XR) 500 MG 24 hr tablet Take 1 tablet (500 mg total) by mouth 2 (two) times daily. Please schedule clinic appt before next refill. 60 tablet 0   metoprolol tartrate (LOPRESSOR) 25 MG tablet Take 1 tablet when you have palpitations (Patient taking differently: Take 25 mg by mouth as needed (AS  DIRECTD for palpitations).) 30 tablet 1   empagliflozin (JARDIANCE) 10 MG TABS tablet Take 1 tablet (10 mg total) by mouth daily. 30 tablet 11   furosemide (LASIX) 40 MG tablet Take 1 tablet (40 mg total) by mouth as needed for fluid or edema (AS DIRECTED). 30 tablet 3   ibuprofen (ADVIL) 600 MG tablet Take 1 tablet (600 mg total) by mouth every 6 (six) hours as needed for up to 30 doses for mild pain or moderate pain. (Patient not taking: Reported on 10/01/2022) 30 tablet 0   metoprolol succinate (TOPROL XL) 50 MG 24 hr  tablet Take 1 tablet (50 mg total) by mouth in the morning and at bedtime. Take with or immediately following a meal. 180 tablet 3   oxyCODONE (ROXICODONE) 5 MG immediate release tablet Take 1 tablet (5 mg total) by mouth every 6 (six) hours as needed for up to 15 doses for severe pain. (Patient not taking: Reported on 07/17/2023) 15 tablet 0   potassium chloride SA (KLOR-CON M) 20 MEQ tablet Take 1 tablet (20 mEq total) by mouth daily. As needed when lasix dose is taken    HF Fund 30 tablet 3   sacubitril-valsartan (ENTRESTO) 97-103 MG Take 1 tablet by mouth 2 (two) times daily. 60 tablet 6   spironolactone (ALDACTONE) 25 MG tablet Take 1 tablet (25 mg total) by mouth daily. 30 tablet 6   tirzepatide (MOUNJARO) 2.5 MG/0.5ML Pen Inject 2.5 mg into the skin once a week. 2 mL 0   No current facility-administered medications for this visit.   Allergies  Allergen Reactions   Statins Shortness Of Breath and Other (See Comments)    Fatigue, also   Sulfa Antibiotics Other (See Comments)    Reaction not known   Latex Rash   Social History   Socioeconomic History   Marital status: Married    Spouse name: Not on file   Number of children: Not on file   Years of education: Not on file   Highest education level: Not on file  Occupational History   Not on file  Tobacco Use   Smoking status: Former    Current packs/day: 0.00    Types: Cigarettes    Quit date: 01/12/2010    Years since quitting: 13.5   Smokeless tobacco: Never  Vaping Use   Vaping status: Never Used  Substance and Sexual Activity   Alcohol use: No   Drug use: No   Sexual activity: Not on file  Other Topics Concern   Not on file  Social History Narrative   Marital status: divorced and engaged since 2011.     Children: 2 children      Lives with fiance and two children (20, 10)      Employment: childcare early development; 3s and 4s. Chiropodist      Tobacco: none      Alcohol: none      Drugs; None       Exercise: sporadic.           Social Determinants of Health   Financial Resource Strain: Medium Risk (02/15/2021)   Overall Financial Resource Strain (CARDIA)    Difficulty of Paying Living Expenses: Somewhat hard  Food Insecurity: No Food Insecurity (03/02/2021)   Hunger Vital Sign    Worried About Running Out of Food in the Last Year: Never true    Ran Out of Food in the Last Year: Never true  Transportation Needs: No Transportation Needs (02/15/2021)  PRAPARE - Administrator, Civil Service (Medical): No    Lack of Transportation (Non-Medical): No  Physical Activity: Not on file  Stress: Not on file  Social Connections: Unknown (01/17/2022)   Received from [facility]   Social Network    Social Network: Not on file  Intimate Partner Violence: Unknown (12/21/2021)   Received from [facility], [facility]   HITS    Physically Hurt: Not on file    Insult or Talk Down To: Not on file    Threaten Physical Harm: Not on file    Scream or Curse: Not on file    BP 102/61   Pulse 78   Wt 273 lb (123.8 kg)   SpO2 98%   BMI 53.32 kg/m   Wt Readings from Last 3 Encounters:  07/17/23 273 lb (123.8 kg)  12/27/22 255 lb 15.3 oz (116.1 kg)  10/01/22 256 lb (116.1 kg)   PHYSICAL EXAM: General: NAD, obese.  Neck: No JVD, no thyromegaly or thyroid nodule.  Lungs: Clear to auscultation bilaterally with normal respiratory effort. CV: Nondisplaced PMI.  Heart regular S1/S2, no S3/S4, no murmur.  No peripheral edema.  No carotid bruit.  Normal pedal pulses.  Abdomen: Soft, nontender, no hepatosplenomegaly, no distention.  Skin: Intact without lesions or rashes.  Neurologic: Alert and oriented x 3.  Psych: Normal affect. Extremities: No clubbing or cyanosis.  HEENT: Normal.   ASSESSMENT & PLAN:  1. Chronic systolic CHF:  New onset cardiomyopathy 5/22, nonischemic cardiomyopathy. Echo in 5/22 with EF 20-25%, moderately decreased RV systolic function.  LHC/RHC  in 6/22 with preserved cardiac output, no significant coronary disease (nonischemic cardiomyopathy). Cardiac MRI with LV EF 21%, RV EF 30%, no myocardial LGE.  No ETOH, no drugs, no FH of CAD or CMP. Consider prior viral myocarditis, effects of diabetes itself, or newly-diagnosed hypothyroidism.  Also some concern for tachycardia-mediated CMP given frequent (albeit short) runs of atrial tachycardia.  Echo in 11/22 showed EF up to 40-45%, normal RV.  NYHA class II symptoms, not volume overloaded on exam.  - Continue Lasix 40 mg prn.  - Continue Entresto 97/103 mg bid.   - Continue spironolactone 25 mg daily. BMET/BNP today.  - Increase Toprol XL 50 mg bid.  - Continue Jardiance 10 mg da - BMET/BNP today.  - She is due for repeat echo, I will order.  - EF is out of ICD range on last echo.  2. Type 2 diabetes: Per PCP.  3. Low TSH: Noted on 4/24 labs. ?Hyperthyroidism.   - Check TSH, free T4 and free T4.  She will need referral to endocrinology if repeat labs show hyperthyroidism.  4. Hyperlipidemia: Unable to tolerate statin.  5. Atrial tachycardia: Frequent episodes noted during 5/22-6/22 admission, recurred when amiodarone stopped.  I am concerned that she could have a tachy-mediated CMP from AT with improvement in EF with decreased AT on medication regimen.  Zio monitor in 2/24 showed 9.2% PACs, 1374 AT runs (longest 4 minutes).  - She will stay off amiodarone for now.  - EP has seen her, they thought that her high BMI makes ablation less likely to be successful.  Would consider in future if she can lose weight.  - Continue Toprol XL 50 mg bid.  6. Obesity: She is working on weight loss through dietary changes and increased activity. Body mass index is 53.32 kg/m.  - She did not tolerate semaglutide but wants to try [facility].  I will  see if we can get this for her.   Followup 2 months with APP.   Marca Ancona 07/17/2023

## 2023-07-17 NOTE — Telephone Encounter (Signed)
Patient Advocate Encounter  Prior authorization for Vickie Graham has been submitted and approved. Test billing returns $4 for 90 day supply.  Key: [id number] Effective: 07/17/2023 to 07/16/2024  Burnell Blanks, CPhT Rx Patient Advocate Phone: [phone]

## 2023-07-18 ENCOUNTER — Other Ambulatory Visit: Payer: Self-pay

## 2023-07-18 ENCOUNTER — Other Ambulatory Visit (HOSPITAL_COMMUNITY): Payer: Medicaid Other

## 2023-07-18 LAB — BASIC METABOLIC PANEL
BUN/Creatinine Ratio: 25 — ABNORMAL HIGH (ref 9–23)
BUN: 20 mg/dL (ref 6–24)
CO2: 18 mmol/L — ABNORMAL LOW (ref 20–29)
Calcium: 9.6 mg/dL (ref 8.7–10.2)
Chloride: 104 mmol/L (ref 96–106)
Creatinine, Ser: 0.8 mg/dL (ref 0.57–1.00)
Glucose: 116 mg/dL — ABNORMAL HIGH (ref 70–99)
Potassium: 4.5 mmol/L (ref 3.5–5.2)
Sodium: 138 mmol/L (ref 134–144)
eGFR: 92 mL/min/{1.73_m2} (ref >59)

## 2023-07-18 LAB — BRAIN NATRIURETIC PEPTIDE: BNP: 36.4 pg/mL (ref 0.0–100.0)

## 2023-07-18 LAB — T4, FREE: Free T4: 0.69 ng/dL — ABNORMAL LOW (ref 0.82–1.77)

## 2023-07-18 LAB — TSH: TSH: 24 u[IU]/mL — ABNORMAL HIGH (ref 0.450–4.500)

## 2023-07-18 LAB — T3, FREE: T3, Free: 1.9 pg/mL — ABNORMAL LOW (ref 2.0–4.4)

## 2023-07-18 MED ORDER — LEVOTHYROXINE SODIUM 25 MCG PO TABS: 25.0000 ug | ORAL_TABLET | Freq: Every day | ORAL | 3 refills | Status: AC

## 2023-07-18 NOTE — Telephone Encounter (Signed)
Vickie Morale, MD      She is now hypothyroid.  Would start Levoxyl 25 mcg daily and forward labs to her PCP for long-term followup.    Pt aware, agreeable, and verbalized understanding  Med added and sent to pharmacy requested by pt.

## 2023-07-23 NOTE — Patient Instructions (Signed)
It was great to see you! Thank you for allowing me to participate in your care!  I recommend that you always bring your medications to each appointment as this makes it easy to ensure we are on the correct medications and helps us not miss when refills are needed.  Our plans for today:  - *** -   We are checking some labs today, I will call you if they are abnormal will send you a MyChart message or a letter if they are normal.  If you do not hear about your labs in the next 2 weeks please let us know.***  Take care and seek immediate care sooner if you develop any concerns.   Dr. Akeiba Axelson, MD [facility]  

## 2023-07-23 NOTE — Progress Notes (Signed)
  SUBJECTIVE:   CHIEF COMPLAINT / HPI:   Hypothyroidism Meds Synthroid 25 mcg daily Was started on medication recently, was having nausea and fatigue with the thyroid medicine, every time she took it. Started taking the thyroid medicine Saturday morning and Sunday. Has appreciated hear thinning, weight has been steady, and fatigue has always been an issue (2/2 to CHF and eating habits).   [place]jurao Not tolerating it, had diarrhea and cramps. Is wanting to loose weight to get heart ablation. Feels like the side effects of this were worse than ozempic. Still having diarrhea.   DM Wanting A1c checked Meds: Jardiance 10 mg, metformin XR 500 mg BID  PERTINENT  PMH / PSH:    OBJECTIVE:  BP 120/77   Pulse 61   Ht 5' (1.524 m)   Wt 265 lb 9.6 oz (120.5 kg)   SpO2 96%   BMI 51.87 kg/m  Physical Exam Constitutional:      General: She is not in acute distress.    Appearance: Normal appearance. She is not ill-appearing.  Cardiovascular:     Rate and Rhythm: Normal rate and regular rhythm.     Pulses: Normal pulses.     Heart sounds: Normal heart sounds. No murmur heard.    No friction rub. No gallop.  Pulmonary:     Effort: Pulmonary effort is normal. No respiratory distress.     Breath sounds: Normal breath sounds. No stridor. No wheezing, rhonchi or rales.  Abdominal:     General: There is no distension.     Palpations: Abdomen is soft. There is no mass.     Tenderness: There is abdominal tenderness. There is no guarding.     Hernia: No hernia is present.  Neurological:     Mental Status: She is alert.  Psychiatric:        Mood and Affect: Mood normal.        Behavior: Behavior normal.      ASSESSMENT/PLAN:   Assessment & Plan Morbid obesity (HCC) Patient previously started on Mounjaro for weight loss, with poor GLP tolerance history.  Patient did not tolerate Mounjaro well, was having diarrhea, cramping, abdominal pain.  Patient has not medication.  Will recommend patient  no longer try injectables.  Patient wanting to lose weight for ablation, for heart, to help with heart issues/tachycardia.  Will recommend patient follow-up with weight management, as she has tried injectables without success. - Referral to healthy weight and wellness - DC Mounjaro Hypothyroidism, unspecified type Patient presents for follow-up of her hypothyroidism.  Patient had TSH checked last month at cardiologist office, noted to be high, was started on Synthroid.  Patient started Synthroid and Mounjaro at same time, with history of poor tolerance for GLP's.  Patient did not tolerate both medications, and was having diarrhea, nausea, cramping with Synthroid.  She stopped both medications.  Will recommend patient restart Synthroid after 1 week washout, for Mounjaro.  Plan for patient to follow-up soon for TSH check. - Restart Synthroid in 1 week - Make follow-up appointment if not tolerating Synthroid - TSH check in 6-8 weeks Type 2 diabetes mellitus without complication, without long-term current use of insulin (HCC) Checking A1c today, patient on metformin and Jardiance. -A1c No follow-ups on file. Bess Kinds, MD 07/24/2023, 10:31 AM PGY-3, [facility]

## 2023-07-24 ENCOUNTER — Encounter: Payer: Self-pay | Admitting: Student

## 2023-07-24 ENCOUNTER — Ambulatory Visit: Payer: Medicaid Other | Admitting: Student

## 2023-07-24 ENCOUNTER — Other Ambulatory Visit: Payer: Self-pay

## 2023-07-24 VITALS — BP 120/77 | HR 61 | Ht 60.0 in | Wt 265.6 lb

## 2023-07-24 DIAGNOSIS — E119 Type 2 diabetes mellitus without complications: Secondary | ICD-10-CM

## 2023-07-24 DIAGNOSIS — Z7984 Long term (current) use of oral hypoglycemic drugs: Secondary | ICD-10-CM

## 2023-07-24 DIAGNOSIS — E039 Hypothyroidism, unspecified: Secondary | ICD-10-CM

## 2023-07-24 LAB — POCT GLYCOSYLATED HEMOGLOBIN (HGB A1C): HbA1c, POC (controlled diabetic range): 6.3 % (ref 0.0–7.0)

## 2023-07-24 NOTE — Assessment & Plan Note (Addendum)
Checking A1c today, patient on metformin and Jardiance. -A1c

## 2023-07-24 NOTE — Assessment & Plan Note (Addendum)
Patient presents for follow-up of her hypothyroidism.  Patient had TSH checked last month at cardiologist office, noted to be high, was started on Synthroid.  Patient started Synthroid and Mounjaro at same time, with history of poor tolerance for GLP's.  Patient did not tolerate both medications, and was having diarrhea, nausea, cramping with Synthroid.  She stopped both medications.  Will recommend patient restart Synthroid after 1 week washout, for Mounjaro.  Plan for patient to follow-up soon for TSH check. - Restart Synthroid in 1 week - Make follow-up appointment if not tolerating Synthroid - TSH check in 6-8 weeks

## 2023-07-24 NOTE — Assessment & Plan Note (Addendum)
Patient previously started on Mounjaro for weight loss, with poor GLP tolerance history.  Patient did not tolerate Mounjaro well, was having diarrhea, cramping, abdominal pain.  Patient has not medication.  Will recommend patient no longer try injectables.  Patient wanting to lose weight for ablation, for heart, to help with heart issues/tachycardia.  Will recommend patient follow-up with weight management, as she has tried injectables without success. - Referral to healthy weight and wellness - DC [facility]

## 2023-07-26 ENCOUNTER — Ambulatory Visit (HOSPITAL_COMMUNITY)
Admission: RE | Admit: 2023-07-26 | Discharge: 2023-07-26 | Disposition: A | Discharge status: Home / Self Care | Payer: Medicaid Other | Source: Ambulatory Visit | Attending: Cardiology | Admitting: Cardiology

## 2023-07-26 DIAGNOSIS — I5020 Unspecified systolic (congestive) heart failure: Secondary | ICD-10-CM

## 2023-07-26 DIAGNOSIS — I5022 Chronic systolic (congestive) heart failure: Secondary | ICD-10-CM

## 2023-07-26 DIAGNOSIS — E785 Hyperlipidemia, unspecified: Secondary | ICD-10-CM | POA: Insufficient documentation

## 2023-07-26 DIAGNOSIS — I5040 Unspecified combined systolic (congestive) and diastolic (congestive) heart failure: Secondary | ICD-10-CM | POA: Diagnosis not present

## 2023-07-26 DIAGNOSIS — E119 Type 2 diabetes mellitus without complications: Secondary | ICD-10-CM | POA: Insufficient documentation

## 2023-07-26 DIAGNOSIS — Z87891 Personal history of nicotine dependence: Secondary | ICD-10-CM | POA: Insufficient documentation

## 2023-07-26 LAB — ECHOCARDIOGRAM COMPLETE
AR max vel: 1.62 cm2
AV Area VTI: 2.02 cm2
AV Area mean vel: 1.44 cm2
AV Mean grad: 6.5 mm[Hg]
AV Peak grad: 11.3 mm[Hg]
Ao pk vel: 1.68 m/s
Area-P 1/2: 4.46 cm2
S' Lateral: 3 cm

## 2023-08-23 ENCOUNTER — Telehealth (HOSPITAL_COMMUNITY): Payer: Self-pay | Admitting: Cardiology

## 2023-08-23 NOTE — Telephone Encounter (Signed)
Front office called patient at [phone] and left a message (reminder call) about appointment on 08/26/2023 with Dr. Shirlee Latch.

## 2023-08-26 ENCOUNTER — Encounter (HOSPITAL_COMMUNITY): Payer: Self-pay | Admitting: Cardiology

## 2023-08-26 ENCOUNTER — Ambulatory Visit (HOSPITAL_COMMUNITY)
Admission: RE | Admit: 2023-08-26 | Discharge: 2023-08-26 | Disposition: A | Discharge status: Home / Self Care | Payer: Medicaid Other | Source: Ambulatory Visit | Attending: Cardiology | Admitting: Cardiology

## 2023-08-26 VITALS — BP 92/60 | HR 58 | Wt 276.4 lb

## 2023-08-26 DIAGNOSIS — E119 Type 2 diabetes mellitus without complications: Secondary | ICD-10-CM | POA: Diagnosis not present

## 2023-08-26 DIAGNOSIS — F32A Depression, unspecified: Secondary | ICD-10-CM | POA: Diagnosis not present

## 2023-08-26 DIAGNOSIS — E039 Hypothyroidism, unspecified: Secondary | ICD-10-CM | POA: Diagnosis not present

## 2023-08-26 DIAGNOSIS — Z79890 Hormone replacement therapy (postmenopausal): Secondary | ICD-10-CM | POA: Diagnosis not present

## 2023-08-26 DIAGNOSIS — I5022 Chronic systolic (congestive) heart failure: Secondary | ICD-10-CM | POA: Insufficient documentation

## 2023-08-26 DIAGNOSIS — Z6841 Body Mass Index (BMI) 40.0 and over, adult: Secondary | ICD-10-CM | POA: Insufficient documentation

## 2023-08-26 DIAGNOSIS — I110 Hypertensive heart disease with heart failure: Secondary | ICD-10-CM | POA: Diagnosis not present

## 2023-08-26 DIAGNOSIS — F419 Anxiety disorder, unspecified: Secondary | ICD-10-CM | POA: Insufficient documentation

## 2023-08-26 DIAGNOSIS — Z7984 Long term (current) use of oral hypoglycemic drugs: Secondary | ICD-10-CM | POA: Insufficient documentation

## 2023-08-26 DIAGNOSIS — E669 Obesity, unspecified: Secondary | ICD-10-CM | POA: Insufficient documentation

## 2023-08-26 DIAGNOSIS — E785 Hyperlipidemia, unspecified: Secondary | ICD-10-CM | POA: Diagnosis not present

## 2023-08-26 DIAGNOSIS — I5020 Unspecified systolic (congestive) heart failure: Secondary | ICD-10-CM

## 2023-08-26 DIAGNOSIS — K219 Gastro-esophageal reflux disease without esophagitis: Secondary | ICD-10-CM | POA: Diagnosis not present

## 2023-08-26 DIAGNOSIS — Z79899 Other long term (current) drug therapy: Secondary | ICD-10-CM | POA: Diagnosis not present

## 2023-08-26 DIAGNOSIS — I4719 Other supraventricular tachycardia: Secondary | ICD-10-CM | POA: Insufficient documentation

## 2023-08-26 DIAGNOSIS — I428 Other cardiomyopathies: Secondary | ICD-10-CM | POA: Diagnosis not present

## 2023-08-26 DIAGNOSIS — Z87891 Personal history of nicotine dependence: Secondary | ICD-10-CM | POA: Diagnosis not present

## 2023-08-26 LAB — BASIC METABOLIC PANEL
Anion gap: 10 (ref 5–15)
BUN: 16 mg/dL (ref 6–20)
CO2: 20 mmol/L — ABNORMAL LOW (ref 22–32)
Calcium: 9.2 mg/dL (ref 8.9–10.3)
Chloride: 105 mmol/L (ref 98–111)
Creatinine, Ser: 1.09 mg/dL — ABNORMAL HIGH (ref 0.44–1.00)
GFR, Estimated: >60 mL/min (ref >60)
Glucose, Bld: 107 mg/dL — ABNORMAL HIGH (ref 70–99)
Potassium: 4.2 mmol/L (ref 3.5–5.1)
Sodium: 135 mmol/L (ref 135–145)

## 2023-08-26 NOTE — Progress Notes (Signed)
ADVANCED HF CLINIC NOTE  Primary Care: Dr. Maury Dus Primary HF Cardiologist: Dr. Shirlee Latch  HPI: 47 y.o. female w/ h/o systolic HF/nonischemic CM, obesity, ADHD, anxiety, depression, hypothyroidism, GERD, PACs, DM2.  Presented to ED on 02/13/21 w/ complaints of 3 week h/o progressive DOE, LEE and abdominal fullness. Found to be in acute CHF. BNP 1388. CXR w/ mild congestive CHF w/ interstitial edema. EKG showed ST 120 bpm. HS trop 48>>50.  TSH 13.416. Free T4 WNL. Chest CT negative for PE. She was admitted for CHF and started on IV Lasix. Echo showed biventricular dysfunction. LVEF severely reduced 20-25% w/ global HK. Mild LVH. RV moderately reduced w/ estimated RVSP 36 mmHg. Also found to have new diagnosis of T2D, Hgb A1c 11. She responded well to diuresis and was transitioned to oral Lasix and discharged on GDMT.  Seen in ED on 06/13/21 with fatigue. Fluoxetine and metformin recently increased at PCP visit 09/16. Chest x-ray with evidence of mild interstitial edema. It was recommended to increase lasix to 80 mg daily for 3 days. Declined COVID test as had negative result at home. No labs or ECG done.    Echo in 10/22 showed EF 40-45%, mild LV enlargement, normal RV, normal IVC.  Zio monitor in 2/24 showed 9.2% PACs, 1374 AT runs (longest 4 minutes).   Echo in 11/24 showed EF up to 55-60%, normal RV, normal IVC.   She returns today for followup of CHF.  No palpitations.  No significant exertional dyspnea or chest pain.  No lightheadedness. Weight is unchanged, she did not tolerate tirzepatide due to diarrhea.  No orthopnea/PND. No palpitations.  She still takes Lasix one or two times/week.   ECG (personally reviewed): NSR, PVC  Labs (9/22): TSH mildly elevated, K 4, creatinine 1.07, LFTs normal Labs (12/22): K 3.5, creatinine 0.86 Labs (4/24): BNP 92, TSH < 0.010 Labs (10/24): K 4.5, creatinine 0.8  PMH: 1. Type 2 diabetes 2. ADHD 3. HTN 4. Depression 5. GERD 6. Obesity 7.  Chronic systolic CHF:  Nonischemic cardiomyopathy.  - Echo (5/22) with EF 20-25%, global HK, moderately decreased RV systolic function.  - RHC/LHC: No significant CAD; mean RA 6, PA 53/20 mean 30, mean PCWP 20, CI (Fick) 2.74, PVR 1.7 WU.  - Cardiac MRI (6/22): Mildly dilated LV with EF 21%, RV EF 30%, no definite LGE (difficult images).  - Echo (10/22) with EF 40-45%, mild LV enlargement, normal RV.  - Echo (11/24): EF up to 55-60%, normal RV, normal IVC. 8. Hyperthyroidism => hypothyroidism.  9. Atrial tachycardia - Zio (2/24): 9.2% PACs, 1374 AT runs (longest 4 minutes) 10. Hyperlipidemia.   ROS: All systems reviewed and negative except as per HPI.   FH: No family history of cardiomyopathy.    Current Outpatient Medications  Medication Sig Dispense Refill   acetaminophen (TYLENOL) 325 MG tablet Take 2 tablets (650 mg total) by mouth every 6 (six) hours as needed for mild pain.     empagliflozin (JARDIANCE) 10 MG TABS tablet Take 1 tablet (10 mg total) by mouth daily. 30 tablet 11   FLUoxetine (PROZAC) 40 MG capsule Take 1 capsule (40 mg total) by mouth daily. 90 capsule 0   fluticasone (FLONASE) 50 MCG/ACT nasal spray Place 1 spray into both nostrils 2 (two) times daily as needed for allergies or rhinitis.     furosemide (LASIX) 40 MG tablet Take 1 tablet (40 mg total) by mouth as needed for fluid or edema (AS DIRECTED). 30 tablet 3  levothyroxine (SYNTHROID) 25 MCG tablet Take 1 tablet (25 mcg total) by mouth daily before breakfast. 90 tablet 3   loratadine (CLARITIN) 10 MG tablet Take 10 mg by mouth daily as needed for allergies or rhinitis.     metFORMIN (GLUCOPHAGE-XR) 500 MG 24 hr tablet Take 1 tablet (500 mg total) by mouth 2 (two) times daily. Please schedule clinic appt before next refill. 60 tablet 0   metoprolol succinate (TOPROL XL) 50 MG 24 hr tablet Take 1 tablet (50 mg total) by mouth in the morning and at bedtime. Take with or immediately following a meal. 180 tablet 3    metoprolol tartrate (LOPRESSOR) 25 MG tablet Take 1 tablet when you have palpitations 30 tablet 1   potassium chloride SA (KLOR-CON M) 20 MEQ tablet Take 20 mEq by mouth as needed.     sacubitril-valsartan (ENTRESTO) 97-103 MG Take 1 tablet by mouth 2 (two) times daily. 60 tablet 6   spironolactone (ALDACTONE) 25 MG tablet Take 1 tablet (25 mg total) by mouth daily. 30 tablet 6   No current facility-administered medications for this encounter.   Allergies  Allergen Reactions   Statins Shortness Of Breath and Other (See Comments)    Fatigue, also   Sulfa Antibiotics Other (See Comments)    Reaction not known   Latex Rash   Social History   Socioeconomic History   Marital status: Married    Spouse name: Not on file   Number of children: Not on file   Years of education: Not on file   Highest education level: Not on file  Occupational History   Not on file  Tobacco Use   Smoking status: Former    Current packs/day: 0.00    Types: Cigarettes    Quit date: 01/12/2010    Years since quitting: 13.6   Smokeless tobacco: Never  Vaping Use   Vaping status: Never Used  Substance and Sexual Activity   Alcohol use: No   Drug use: No   Sexual activity: Not on file  Other Topics Concern   Not on file  Social History Narrative   Marital status: divorced and engaged since 2011.     Children: 2 children      Lives with fiance and two children (20, 10)      Employment: childcare early development; 3s and 4s. Chiropodist      Tobacco: none      Alcohol: none      Drugs; None      Exercise: sporadic.           Social Determinants of Health   Financial Resource Strain: Medium Risk (02/15/2021)   Overall Financial Resource Strain (CARDIA)    Difficulty of Paying Living Expenses: Somewhat hard  Food Insecurity: No Food Insecurity (03/02/2021)   Hunger Vital Sign    Worried About Running Out of Food in the Last Year: Never true    Ran Out of Food in the Last Year: Never true   Transportation Needs: No Transportation Needs (02/15/2021)   PRAPARE - Administrator, Civil Service (Medical): No    Lack of Transportation (Non-Medical): No  Physical Activity: Not on file  Stress: Not on file  Social Connections: Unknown (01/17/2022)   Received from [facility]   Social Network    Social Network: Not on file  Intimate Partner Violence: Unknown (12/21/2021)   [facility] from [facility], [facility]   HITS    Physically Hurt: Not  on file    Insult or Talk Down To: Not on file    Threaten Physical Harm: Not on file    Scream or Curse: Not on file    BP 92/60   Pulse (!) 58   Wt 125.4 kg (276 lb 6.4 oz)   SpO2 95%   BMI 53.98 kg/m   Wt Readings from Last 3 Encounters:  08/26/23 125.4 kg (276 lb 6.4 oz)  07/24/23 120.5 kg (265 lb 9.6 oz)  07/17/23 123.8 kg (273 lb)   PHYSICAL EXAM: General: NAD Neck: No JVD, no thyromegaly or thyroid nodule.  Lungs: Clear to auscultation bilaterally with normal respiratory effort. CV: Nondisplaced PMI.  Heart regular S1/S2, no S3/S4, no murmur.  No peripheral edema.  No carotid bruit.  Normal pedal pulses.  Abdomen: Soft, nontender, no hepatosplenomegaly, no distention.  Skin: Intact without lesions or rashes.  Neurologic: Alert and oriented x 3.  Psych: Normal affect. Extremities: No clubbing or cyanosis.  HEENT: Normal.   ASSESSMENT & PLAN:  1. Chronic systolic CHF:  New onset cardiomyopathy 5/22, nonischemic cardiomyopathy. Echo in 5/22 with EF 20-25%, moderately decreased RV systolic function.  LHC/RHC in 6/22 with preserved cardiac output, no significant coronary disease (nonischemic cardiomyopathy). Cardiac MRI with LV EF 21%, RV EF 30%, no myocardial LGE.  No ETOH, no drugs, no FH of CAD or CMP. Consider prior viral myocarditis, effects of diabetes itself, or newly-diagnosed hypothyroidism.  Also some concern for tachycardia-mediated CMP given frequent (albeit short) runs of atrial  tachycardia.  Echo in 11/22 showed EF up to 40-45%, normal RV.  Echo in 11/24 showed normal EF, 55-60%, with normal RV.  NYHA class I-II symptoms, not volume overloaded on exam.  - Continue Lasix 40 mg prn.  - Continue Entresto 97/103 mg bid.   - Continue spironolactone 25 mg daily. BMET today.  - Continue Toprol XL 50 mg bid.  - Continue Jardiance 10 mg daily 2. Type 2 diabetes: Per PCP.  3. Hypothyroidism: On Levoxyl, per PCP.  4. Hyperlipidemia: Unable to tolerate statin.  5. Atrial tachycardia: Frequent episodes noted during 5/22-6/22 admission, recurred when amiodarone stopped.  I am concerned that she could have a tachy-mediated CMP from AT with improvement in EF with decreased AT on medication regimen.  Zio monitor in 2/24 showed 9.2% PACs, 1374 AT runs (longest 4 minutes).  No recent palpitations.  - Continue Toprol XL 50 mg bid.  6. Obesity: She is working on weight loss through dietary changes and increased activity. Body mass index is 53.98 kg/m.  - She did not tolerate semaglutide or tirzepatide.   Followup 6 months with APP.   Marca Ancona 08/26/2023

## 2023-08-26 NOTE — Patient Instructions (Signed)
Great to see you today!!!  Labs done today, your results will be available in MyChart, we will contact you for abnormal readings.  Do the following things EVERYDAY: Weigh yourself in the morning before breakfast. Write it down and keep it in a log. Take your medicines as prescribed Eat low salt foods--Limit salt (sodium) to 2000 mg per day.  Stay as active as you can everyday Limit all fluids for the day to less than 2 liters  Your physician recommends that you schedule a follow-up appointment in: 6 months (June 2025), **PLEASE CALL OUR OFFICE IN APRIL TO SCHEDULE THIS APPOINTMENT  If you have any questions or concerns before your next appointment please send [place] a message through mychart or call our office at [phone].    TO LEAVE A MESSAGE FOR THE NURSE SELECT OPTION 2, PLEASE LEAVE A MESSAGE INCLUDING: YOUR NAME DATE OF BIRTH CALL BACK NUMBER REASON FOR CALL**this is important as we prioritize the call backs  YOU WILL RECEIVE A CALL BACK THE SAME DAY AS LONG AS YOU CALL BEFORE 4:00 PM  Do the following things EVERYDAY: Weigh yourself in the morning before breakfast. Write it down and keep it in a log. Take your medicines as prescribed Eat low salt foods--Limit salt (sodium) to 2000 mg per day.  Stay as active as you can everyday Limit all fluids for the day to less than 2 liters

## 2023-08-27 ENCOUNTER — Other Ambulatory Visit: Payer: Self-pay | Admitting: Family Medicine

## 2023-08-27 DIAGNOSIS — E119 Type 2 diabetes mellitus without complications: Secondary | ICD-10-CM

## 2023-09-05 ENCOUNTER — Ambulatory Visit: Payer: Medicaid Other | Admitting: Family Medicine

## 2023-09-19 ENCOUNTER — Other Ambulatory Visit: Payer: Self-pay | Admitting: Family Medicine

## 2023-09-19 DIAGNOSIS — F419 Anxiety disorder, unspecified: Secondary | ICD-10-CM

## 2023-09-22 ENCOUNTER — Other Ambulatory Visit: Payer: Self-pay | Admitting: Family Medicine

## 2023-09-22 DIAGNOSIS — E119 Type 2 diabetes mellitus without complications: Secondary | ICD-10-CM

## 2023-09-24 ENCOUNTER — Other Ambulatory Visit: Payer: Self-pay | Admitting: *Deleted

## 2023-09-24 DIAGNOSIS — E119 Type 2 diabetes mellitus without complications: Secondary | ICD-10-CM

## 2023-09-24 NOTE — Telephone Encounter (Signed)
 Chart reviewed. Rx refilled.

## 2023-10-20 ENCOUNTER — Other Ambulatory Visit: Payer: Self-pay | Admitting: Family Medicine

## 2023-10-20 DIAGNOSIS — E119 Type 2 diabetes mellitus without complications: Secondary | ICD-10-CM

## 2023-10-22 NOTE — Telephone Encounter (Signed)
Chart reviewed. A1c 6.3 in Nov 2024. Rx refilled.

## 2023-10-24 ENCOUNTER — Ambulatory Visit: Payer: Medicaid Other | Admitting: Family Medicine

## 2023-10-30 ENCOUNTER — Encounter (INDEPENDENT_AMBULATORY_CARE_PROVIDER_SITE_OTHER): Payer: Self-pay | Admitting: Family Medicine

## 2023-10-30 ENCOUNTER — Ambulatory Visit (INDEPENDENT_AMBULATORY_CARE_PROVIDER_SITE_OTHER): Payer: Medicaid Other | Admitting: Family Medicine

## 2023-10-30 VITALS — BP 102/67 | HR 77 | Temp 97.7°F | Ht 59.5 in | Wt 275.0 lb

## 2023-10-30 DIAGNOSIS — Z6841 Body Mass Index (BMI) 40.0 and over, adult: Secondary | ICD-10-CM | POA: Diagnosis not present

## 2023-10-30 DIAGNOSIS — Z7984 Long term (current) use of oral hypoglycemic drugs: Secondary | ICD-10-CM

## 2023-10-30 DIAGNOSIS — I5020 Unspecified systolic (congestive) heart failure: Secondary | ICD-10-CM

## 2023-10-30 DIAGNOSIS — E119 Type 2 diabetes mellitus without complications: Secondary | ICD-10-CM | POA: Diagnosis not present

## 2023-10-30 DIAGNOSIS — Z0289 Encounter for other administrative examinations: Secondary | ICD-10-CM

## 2023-10-30 NOTE — Progress Notes (Signed)
Carlye Grippe, DO, ABFM, ABOM Bariatric physician [address] [place], [place], [place] [address] Office: [phone]  /  Fax: [phone]     Initial Evaluation:  Vickie Graham was seen in clinic today to evaluate for obesity. She is interested in losing weight to improve overall health and reduce the risk of weight related complications. She presents today to review program treatment options, initial physical assessment, and evaluation.      She was referred by: Specialist (Cardiologist, Dr. Shirlee Latch)  When asked how has your weight affected you? She states: Contributed to medical problems, Contributed to orthopedic problems or mobility issues, Problems with eating patterns, and Has affected mood  Hx of depression and anxiety.   Contributing factors to her weight change: Family history of obesity, Moderate to high levels of stress, Reduced physical activity, and Eating patterns  Some associated conditions: Hypertension, Hyperlipidemia, and Diabetes and Heart Disease. Hx of acute systolic heart failure.   Current nutrition plan: Cycles through various "diets" every week or every other week for 1 year or so.  Paleo diet for 5 years prior to 2017, only thing that helped weight loss. Most recently has tried Keto, Weight Watchers, nume, etc.   Current level of physical activity: NEAT  Current or previous pharmacotherapy: GLP-1, Metformin, and GLP-1 + GIP. Currently on Metformin. Previously was on Mounjaro and Ozempic.   Response to medication: Had side effects so it was discontinued Nausea, vomiting, diarrhea, and headaches for Mounjaro and Ozempic, SE worse on Mounjaro than on Ozempic.    Barriers to weight loss that patient expresses a concern about today: multiple competing priorities, work schedule, low volume of physical activity at present , orthopedic problems, medical conditions or chronic pain affecting mobility, medical comorbidities, moderate to high levels of stress, and  emotional eating.    Past Medical History:  Diagnosis Date   ADHD (attention deficit hyperactivity disorder)    diagnosed at [facility].   Allergy    Anxiety    Social anxiety, generalized anxiety disorder.   CHF (congestive heart failure) (HCC)    Depression    Diabetes mellitus without complication (HCC)    HFrEF (heart failure with reduced ejection fraction) (HCC)    Hypertension    SVT (supraventricular tachycardia) (HCC)      Objective:  BP 102/67   Pulse 77   Temp 97.7 F (36.5 C)   Ht 4' 11.5" (1.511 m)   Wt 275 lb (124.7 kg)   LMP 10/16/2023   SpO2 94%   BMI 54.61 kg/m  She was weighed on the bioimpedance scale: Body mass index is 54.61 kg/m.  Visceral Fat %: 22, Body Fat %:  55.1  Weight Lost Since Last Visit: -- (Info Session)  Weight Gained Since Last Visit: -- (Info Session)    Vitals Temp: 97.7 F (36.5 C) BP: 102/67 Pulse Rate: 77 SpO2: 94 %   Anthropometric Measurements Height: 4' 11.5" (1.511 m) Weight: 275 lb (124.7 kg) BMI (Calculated): 54.64 Weight at Last Visit: -- (Info Session) Weight Lost Since Last Visit: -- (Info Session) Weight Gained Since Last Visit: -- (Info Session) Starting Weight: -- (Info Session) Total Weight Loss (lbs):  (0 kg) (Info Session) Peak Weight: 315lb Waist Measurement : 0 inches (Info Session)   Body Composition  Body Fat %: 55.1 % Fat Mass (lbs): 151.8 lbs Muscle Mass (lbs): 117.6 lbs Total Body Water (lbs): 89.8 lbs Visceral Fat Rating : 22   Other Clinical Data Fasting: no Labs: no Today's  Visit #: -- (Info Session) Starting Date:  (Info Session) Comments: Info Session     General: Well Developed, well nourished, and in no acute distress.  HEENT: Normocephalic, atraumatic; EOMI, sclerae are anicteric. Skin: Warm and dry, good turgor Chest:  Normal excursion, shape, no gross ABN Respiratory: No conversational dyspnea; speaking in full sentences NeuroM-Sk:  Normal gross ROM * 4 extremities   Psych: A and O *3, insight adequate, mood- full    Assessment and Plan:   FOR THE DISEASE OF OBESITY: Morbid obesity (HCC) BMI 50.0-59.9, adult (HCC) - Current BMI 54.64 Assessment & Plan: We reviewed anthropometrics, biometrics, associated medical conditions and contributing factors with patient. Armen Pickup would benefit from a medically tailored reduced calorie nutrional plan based on his REE (resting energy expenditure), which will be determined by indirect calorimetry.  We will also assess for cardiometabolic risk and nutritional derangements via fasting labs at intake appointment.    Obesity Treatment / Action Plan:   she was weighed on the bioimpedance scale and results were discussed and documented in the synopsis.   Armen Pickup will complete provided nutritional and psychosocial assessment questionnaire before the next appointment.  she will be scheduled for indirect calorimetry to determine resting energy expenditure in a fasting state.  This will allow [place] to create a reduced calorie, high-protein meal plan to promote loss of fat mass while preserving muscle mass.  We will also assess for cardiometabolic risk and nutritional derangements via an ECG and fasting serologies at her next appointment.  she was encouraged to work on amassing support from family and friends to begin their weight loss journey.   Work on eliminating or reducing the presence of highly processed, poorly nutritious, calorie-dense foods in the home.   Obesity Education Performed Today:  Patient was counseled on nutritional approaches to weight loss and benefits of reducing processed foods and consuming plant-based foods and high quality protein as part of nutritional weight management program.   We discussed the importance of long term lifestyle changes which include nutrition, exercise and behavioral modifications as well as the importance of customizing this to her specific health and social  needs.   We discussed the benefits of reaching a healthier weight to alleviate the symptoms of existing conditions and reduce the risks of the biomechanical, metabolic and psychological effects of obesity.  Was counseled on the health benefits of losing 5%-10% of total body weight.  Was counseled on our cognitive behavorial therapy program, lead by our bariatric psychologist, who focuses on emotional eating and creating positive behavorial change.  Was counseled on bariatric pharmacotherapy and how this may be used as an adjunct in their weight management    Ingrid appears to be in the action stage of change and states they are ready to start intensive lifestyle modifications and behavioral modifications.  It was recommended that she follow up in the next 1-2 weeks to review the above steps, and to continue with treatment of their chronic disease state of obesity   FOR OTHER CONDITIONS RELATED TO THE DISEASE OF OBESITY: Type 2 diabetes mellitus without complication, without long-term current use of insulin [facility]) Assessment & Plan: Lab Results  Component Value Date   HGBA1C 6.3 07/24/2023   HGBA1C 6.1 (H) 01/11/2022   HGBA1C 7.1 (A) 06/02/2021   Pt diagnosed in 2022. Last A1C was well controlled at 6.3 as of 07/24/23. In the past she was briefly on Mounjaro but stopped due to poor tolerance (nausea, vomiting, and diarrhea)  when starting the medication. She notes she was not following an appropriate diet at the time. Was also previously on Ozempic with side effects, but not as bad as with Mounjaro  Reviewed the possible side effects of GLP-1s when eating high carb, high sugar, and low protein diets. Discussed how a healthy diet and exercise can improve condition and help with weight loss. Will monitor condition should pt decide to join the program.    HFrEF (heart failure with reduced ejection fraction) (HCC) Assessment & Plan: Pt needs cardiac ablation for atrial tachycardia. Her EP said  she needs to lose weight prior to this procedure. Her last ECHO on 07/26/23 revealed an LVEF of 55-60% and grade 1 diastolic dysfunction of left ventricle. Prior Echo on 02/14/21 showed LVEH 20-25% with global hypokinesis of left ventricle.   Discussed the benefits of a heart healthy diet. Reviewed how diet and exercise can also help with weight loss. Advised pt to continue to follow up with her cardiology care team as directed by them. Will monitor her condition alongside PCP/specialists should pt decide to join the program.    Attestations:   Reviewed by clinician on day of visit: allergies, medications, problem list, medical history, surgical history, family history, social history, and previous encounter notes pertinent to obesity diagnosis.   58 minutes was spent today on this visit including the above counseling, pre-visit chart review, and post-visit documentation.  Over 50% of this time was spent in direct, face-to-face counseling and coordination of care  I, Isabelle Course, acting as a medical scribe for Thomasene Lot, DO., have compiled all relevant documentation for today's office visit on behalf of Thomasene Lot, DO, while in the presence of [employer], DO.  I have reviewed the above documentation for accuracy and completeness, and I agree with the above. Carlye Grippe, D.O.  The 21st Century Cures Act was signed into law in 2016 which includes the topic of electronic health records.  This provides immediate access to information in MyChart.  This includes consultation notes, operative notes, office notes, lab results and pathology reports.  If you have any questions about what you read please let us know at your next visit so we can discuss your concerns and take corrective action if need be.  We are right here with you!

## 2023-11-01 ENCOUNTER — Encounter: Payer: Self-pay | Admitting: Family Medicine

## 2023-11-01 ENCOUNTER — Ambulatory Visit (INDEPENDENT_AMBULATORY_CARE_PROVIDER_SITE_OTHER): Payer: Medicaid Other | Admitting: Family Medicine

## 2023-11-01 VITALS — BP 100/70 | HR 69 | Ht 59.0 in | Wt 282.2 lb

## 2023-11-01 DIAGNOSIS — E119 Type 2 diabetes mellitus without complications: Secondary | ICD-10-CM | POA: Diagnosis present

## 2023-11-01 DIAGNOSIS — E039 Hypothyroidism, unspecified: Secondary | ICD-10-CM | POA: Diagnosis not present

## 2023-11-01 LAB — POCT GLYCOSYLATED HEMOGLOBIN (HGB A1C): HbA1c, POC (controlled diabetic range): 6.6 % (ref 0.0–7.0)

## 2023-11-01 NOTE — Patient Instructions (Signed)
Thank you for visiting clinic today - it is always our pleasure to care for you.  Today we discussed your diabetes and thyroid function.  We are checking in on a few labs today and I will contact you with results.  In the meantime please continue taking your medications as you have been.  Please schedule an appointment in 3-4 months for continued follow-up.  Reach out any time with any questions or concerns you may have - we are here for you!  Ivery Quale, MD [facility] [phone]

## 2023-11-01 NOTE — Progress Notes (Signed)
    SUBJECTIVE:   CHIEF COMPLAINT / HPI:   Hypothyroidism  Patient presenting today to follow-up on thyroid function.  Was previously told she had both elevated and decreased thyroid function at different points.  A few months ago she was started on Synthroid.  She took this medication for about a month, last took around Christmas time.  She stopped taking the medication because she noted it made her tired.  Since stopping the medication she feels better.  Reports no excessive sweating, palpitations, jitteriness.  Has extra metoprolol as needed for palpitations but hardly ever needs to take this.  Reports no excessive fatigue or temperature intolerance.    T2DM Also following up on diabetes today.  She recently tried [facility] for 1 dose, but this unfortunately caused lots of diarrhea so she stopped taking it.  She continues to take Jardiance and metformin.  Does not regularly check blood sugar at home, may check with dietary changes at times.  Has not checked BG in the past week.  No dizziness, lightheadedness, or fainting.  No nausea, vomiting, or diarrhea.  Has not seen ophthalmologist yet this year.  PERTINENT  PMH / PSH: Hypothyroidism, T2DM, HLD, HFrEF  OBJECTIVE:   BP 100/70   Pulse 69   Ht 4\' 11"  (1.499 m)   Wt 282 lb 4 oz (128 kg)   LMP 10/16/2023   SpO2 98%   BMI 57.01 kg/m   General: Well-appearing. Resting comfortably in room. Neck: Supple.  No palpable thyroid enlargement or nodularity. CV: Normal S1/S2. No extra heart sounds. Warm and well-perfused. Pulm: Breathing comfortably on room air. CTAB. No increased WOB. Abd: Soft, non-tender, non-distended. Skin/Ext:  Warm, dry.  No LE edema.  Normal diabetic foot exam. Psych: Pleasant and appropriate.   ASSESSMENT/PLAN:   Assessment & Plan Type 2 diabetes mellitus without complication, without long-term current use of insulin (HCC) Last A1c 6.3 in November.  Last lipid panel with elevated findings in 2023.  Normal diabetic  foot exam today.  -A1c, lipid panel, urine microalbumin/Cr today -Discussed plan to see ophthalmologist this year -Continue metformin 500 BID and Jardiance 10 daily as previously prescribed Hypothyroidism, unspecified type TSH of 24 with low T4 in Oct 2024, with TSH undetectable 7 months prior.  Unclear etiology of change in lab findings. -TSH, T4 today -Continue holding Synthroid in the meantime  Other Health Maintenance: Declines colonoscopy, mammogram, hep c screening at this time  Return to clinic in 3-4 months for continued follow-up.  Ivery Quale, MD [facility]

## 2023-11-01 NOTE — Assessment & Plan Note (Signed)
TSH of 24 with low T4 in Oct 2024, with TSH undetectable 7 months prior.  Unclear etiology of change in lab findings. -TSH, T4 today -Continue holding Synthroid in the meantime

## 2023-11-01 NOTE — Assessment & Plan Note (Addendum)
Last A1c 6.3 in November.  Last lipid panel with elevated findings in 2023.  Normal diabetic foot exam today.  -A1c, lipid panel, urine microalbumin/Cr today -Discussed plan to see ophthalmologist this year -Continue metformin 500 BID and Jardiance 10 daily as previously prescribed

## 2023-11-02 LAB — T4, FREE: Free T4: 0.73 ng/dL — ABNORMAL LOW (ref 0.82–1.77)

## 2023-11-02 LAB — LIPID PANEL
Chol/HDL Ratio: 6.7 {ratio} — ABNORMAL HIGH (ref 0.0–4.4)
Cholesterol, Total: 315 mg/dL — ABNORMAL HIGH (ref 100–199)
HDL: 47 mg/dL (ref >39)
LDL Chol Calc (NIH): 205 mg/dL — ABNORMAL HIGH (ref 0–99)
Triglycerides: 313 mg/dL — ABNORMAL HIGH (ref 0–149)
VLDL Cholesterol Cal: 63 mg/dL — ABNORMAL HIGH (ref 5–40)

## 2023-11-02 LAB — TSH: TSH: 18.1 u[IU]/mL — ABNORMAL HIGH (ref 0.450–4.500)

## 2023-11-03 LAB — MICROALBUMIN / CREATININE URINE RATIO
Creatinine, Urine: 116.6 mg/dL
Microalb/Creat Ratio: 10 mg/g{creat} (ref 0–29)
Microalbumin, Urine: 11.3 ug/mL

## 2023-11-04 ENCOUNTER — Telehealth: Payer: Self-pay | Admitting: Family Medicine

## 2023-11-04 ENCOUNTER — Encounter: Payer: Self-pay | Admitting: Family Medicine

## 2023-11-04 ENCOUNTER — Other Ambulatory Visit: Payer: Self-pay | Admitting: Family Medicine

## 2023-11-04 MED ORDER — EZETIMIBE 10 MG PO TABS: 10.0000 mg | ORAL_TABLET | Freq: Every day | ORAL | 3 refills | Status: DC

## 2023-11-04 NOTE — Progress Notes (Signed)
Elevated cholesterol on labwork with hx T2DM. Patient with allergy to statins. Zetia ordered.

## 2023-11-04 NOTE — Telephone Encounter (Signed)
Called and spoke with patient regarding recent lab findings.  - Discussed underactive thyroid and resuming previously prescribed Synthroid - Discussed appropriate A1c and microalb/Cr ratio at this time - Discussed elevated cholesterol. Recommended Zetia given statin allergy. Patient prefers to pursue healthy diet/exercise with the weight clinic before turning to medications. Discontinued Zetia order at this time and communicated with the pharmacy. Patient to follow up in a couple months.   Patient expressed understanding throughout conversation and was appreciative of the call.

## 2023-12-09 ENCOUNTER — Ambulatory Visit (INDEPENDENT_AMBULATORY_CARE_PROVIDER_SITE_OTHER): Payer: Medicaid Other | Admitting: Family Medicine

## 2023-12-09 ENCOUNTER — Encounter (INDEPENDENT_AMBULATORY_CARE_PROVIDER_SITE_OTHER): Payer: Self-pay

## 2023-12-21 ENCOUNTER — Other Ambulatory Visit: Payer: Self-pay | Admitting: Family Medicine

## 2023-12-21 DIAGNOSIS — F419 Anxiety disorder, unspecified: Secondary | ICD-10-CM

## 2023-12-23 ENCOUNTER — Ambulatory Visit (INDEPENDENT_AMBULATORY_CARE_PROVIDER_SITE_OTHER): Payer: Medicaid Other | Admitting: Family Medicine

## 2023-12-24 NOTE — Telephone Encounter (Signed)
 Chart reviewed. Rx refilled.

## 2024-01-15 ENCOUNTER — Other Ambulatory Visit: Payer: Self-pay | Admitting: Family Medicine

## 2024-01-15 DIAGNOSIS — E119 Type 2 diabetes mellitus without complications: Secondary | ICD-10-CM

## 2024-01-15 NOTE — Telephone Encounter (Signed)
 Chart reviewed. Rx refilled. Last A1c 6.6 in Feb 2025.

## 2024-04-21 ENCOUNTER — Other Ambulatory Visit: Payer: Self-pay | Admitting: Cardiology

## 2024-05-05 ENCOUNTER — Other Ambulatory Visit: Payer: Self-pay | Admitting: Family Medicine

## 2024-05-05 DIAGNOSIS — E119 Type 2 diabetes mellitus without complications: Secondary | ICD-10-CM

## 2024-05-06 NOTE — Telephone Encounter (Signed)
 Chart reviewed. Rx refilled. Requesting patient FU before next refill.

## 2024-05-15 ENCOUNTER — Other Ambulatory Visit: Payer: Self-pay | Admitting: Cardiology

## 2024-05-16 ENCOUNTER — Other Ambulatory Visit: Payer: Self-pay | Admitting: Cardiology

## 2024-05-18 ENCOUNTER — Other Ambulatory Visit: Payer: Self-pay | Admitting: Cardiology

## 2024-05-29 ENCOUNTER — Other Ambulatory Visit (HOSPITAL_BASED_OUTPATIENT_CLINIC_OR_DEPARTMENT_OTHER): Payer: Self-pay

## 2024-06-16 ENCOUNTER — Other Ambulatory Visit: Payer: Self-pay | Admitting: Cardiology

## 2024-06-24 NOTE — Progress Notes (Incomplete)
 ADVANCED HF CLINIC NOTE  Primary Care: Dr. Romualdo Dixons Primary HF Cardiologist: Dr. Rolan  HPI: 48 y.o. female w/ h/o systolic HF/nonischemic CM, HLD obesity, ADHD, anxiety, depression, hypothyroidism, GERD, PACs, DM2.  Presented to ED on 02/13/21 w/ complaints of 3 week h/o progressive DOE, Vickie Graham and abdominal fullness. Found to be in acute CHF. BNP 1388. CXR w/ mild congestive CHF w/ interstitial edema. EKG showed ST 120 bpm. HS trop 48>>50.  TSH 13.416. Free T4 WNL. Chest CT negative for PE. She was admitted for CHF and started on IV Lasix . Echo showed biventricular dysfunction. LVEF severely reduced 20-25% w/ global HK. Mild LVH. RV moderately reduced w/ estimated RVSP 36 mmHg. Also found to have new diagnosis of T2D, Hgb A1c 11. She responded well to diuresis and was transitioned to oral Lasix  and discharged on GDMT.  Seen in ED on 06/13/21 with fatigue. Fluoxetine  and metformin  recently increased at PCP visit 09/16. Chest x-ray with evidence of mild interstitial edema. It was recommended to increase lasix  to 80 mg daily for 3 days. Declined COVID test as had negative result at home. No labs or ECG done.    Echo in 10/22 showed EF 40-45%, mild LV enlargement, normal RV, normal IVC.  Zio monitor in 2/24 showed 9.2% PACs, 1374 AT runs (longest 4 minutes).   Echo in 11/24 showed EF up to 55-60%, normal RV, normal IVC.   12/24:She returns today for followup of CHF.  No palpitations.  No significant exertional dyspnea or chest pain.  No lightheadedness. Weight is unchanged, she did not tolerate tirzepatide  due to diarrhea.  No orthopnea/PND. No palpitations.  She still takes Lasix  one or two times/week.   She returns today for heart failure follow up. Overall feeling ***. NYHA ***. Reports {Symptoms; cardiac:12860::dyspnea,fatigue}. Denies {Symptoms; cardiac:12860::chest pain,dyspnea,fatigue,near-syncope,orthopnea,palpitations,dizziness,abnormal bleeding}. Able to perform  ADLs. Appetite okay. Weight at home ***. BP at home***. Compliant with all medications.  PMH: 1. Type 2 diabetes 2. ADHD 3. HTN 4. Depression 5. GERD 6. Obesity 7. Chronic systolic CHF:  Nonischemic cardiomyopathy.  - Echo (5/22) with EF 20-25%, global HK, moderately decreased RV systolic function.  - RHC/LHC: No significant CAD; mean RA 6, PA 53/20 mean 30, mean PCWP 20, CI (Fick) 2.74, PVR 1.7 WU.  - Cardiac MRI (6/22): Mildly dilated LV with EF 21%, RV EF 30%, no definite LGE (difficult images).  - Echo (10/22) with EF 40-45%, mild LV enlargement, normal RV.  - Echo (11/24): EF up to 55-60%, normal RV, normal IVC. 8. Hyperthyroidism => hypothyroidism.  9. Atrial tachycardia - Zio (2/24): 9.2% PACs, 1374 AT runs (longest 4 minutes) 10. Hyperlipidemia.   ROS: All systems reviewed and negative except as per HPI.   FH: No family history of cardiomyopathy.    Current Outpatient Medications  Medication Sig Dispense Refill   acetaminophen  (TYLENOL ) 325 MG tablet Take 2 tablets (650 mg total) by mouth every 6 (six) hours as needed for mild pain.     empagliflozin  (JARDIANCE ) 10 MG TABS tablet Take 1 tablet (10 mg total) by mouth daily. 30 tablet 11   FLUoxetine  (PROZAC ) 40 MG capsule Take 1 capsule by mouth once daily 90 capsule 1   fluticasone (FLONASE) 50 MCG/ACT nasal spray Place 1 spray into both nostrils 2 (two) times daily as needed for allergies or rhinitis.     furosemide  (LASIX ) 40 MG tablet Take 1 tablet (40 mg total) by mouth as needed for fluid or edema (AS DIRECTED). 30 tablet 3  levothyroxine  (SYNTHROID ) 25 MCG tablet Take 1 tablet (25 mcg total) by mouth daily before breakfast. (Patient not taking: Reported on 11/01/2023) 90 tablet 3   loratadine (CLARITIN) 10 MG tablet Take 10 mg by mouth daily as needed for allergies or rhinitis.     metFORMIN  (GLUCOPHAGE -XR) 500 MG 24 hr tablet TAKE 1 TABLET BY MOUTH TWICE DAILY . APPOINTMENT REQUIRED FOR FUTURE REFILLS 60 tablet 0    metoprolol  succinate (TOPROL -XL) 50 MG 24 hr tablet TAKE 1 TABLET BY MOUTH IN THE MORNING AND AT BEDTIME .  TAKE  WITH  OR  IMMEDIATELY  FOLLOWING  A  MEAL. 60 tablet 1   metoprolol  tartrate (LOPRESSOR ) 25 MG tablet Take 1 tablet when you have palpitations 30 tablet 1   potassium chloride  SA (KLOR-CON  M) 20 MEQ tablet Take 20 mEq by mouth as needed.     sacubitril -valsartan  (ENTRESTO ) 97-103 MG Take 1 tablet by mouth twice daily 60 tablet 0   spironolactone  (ALDACTONE ) 25 MG tablet TAKE 1 TABLET BY MOUTH ONCE DAILY. NEEDS APPOINTMENT FOR MORE REFILLS 30 tablet 0   No current facility-administered medications for this visit.   Allergies  Allergen Reactions   Statins Shortness Of Breath and Other (See Comments)    Fatigue, also   Sulfa  Antibiotics Other (See Comments)    Reaction not known   Latex Rash   Social History   Socioeconomic History   Marital status: Married    Spouse name: Not on file   Number of children: Not on file   Years of education: Not on file   Highest education level: Associate degree: academic program  Occupational History   Not on file  Tobacco Use   Smoking status: Former    Current packs/day: 0.00    Types: Cigarettes    Quit date: 01/12/2010    Years since quitting: 14.4   Smokeless tobacco: Never  Vaping Use   Vaping status: Never Used  Substance and Sexual Activity   Alcohol use: No   Drug use: No   Sexual activity: Not on file  Other Topics Concern   Not on file  Social History Narrative   Marital status: divorced and engaged since 2011.     Children: 2 children      Lives with fiance and two children (20, 10)      Employment: childcare early development; 3s and 4s. Chiropodist      Tobacco: none      Alcohol: none      Drugs; None      Exercise: sporadic.           Social Drivers of Corporate investment banker Strain: Low Risk  (10/29/2023)   Overall Financial Resource Strain (CARDIA)    Difficulty of Paying Living Expenses:  Not hard at all  Food Insecurity: No Food Insecurity (10/29/2023)   Hunger Vital Sign    Worried About Running Out of Food in the Last Year: Never true    Ran Out of Food in the Last Year: Never true  Transportation Needs: No Transportation Needs (10/29/2023)   PRAPARE - Administrator, Civil Service (Medical): No    Lack of Transportation (Non-Medical): No  Physical Activity: Insufficiently Active (10/29/2023)   Exercise Vital Sign    Days of Exercise per Week: 2 days    Minutes of Exercise per Session: 20 min  Stress: Stress Concern Present (10/29/2023)   [employer] of Occupational Health - Occupational Stress Questionnaire  Feeling of Stress : Rather much  Social Connections: Moderately Isolated (10/29/2023)   Social Connection and Isolation Panel    Frequency of Communication with Friends and Family: Never    Frequency of Social Gatherings with Friends and Family: Never    Attends Religious Services: More than 4 times per year    Active Member of [employer] or Organizations: No    Attends Banker Meetings: Not on file    Marital Status: Married  Intimate Partner Violence: Unknown (12/21/2021)   Received from [facility]   HITS    Physically Hurt: Not on file    Insult or Talk Down To: Not on file    Threaten Physical Harm: Not on file    Scream or Curse: Not on file   There were no vitals taken for this visit.  Wt Readings from Last 3 Encounters:  11/01/23 128 kg (282 lb 4 oz)  10/30/23 124.7 kg (275 lb)  08/26/23 125.4 kg (276 lb 6.4 oz)   PHYSICAL EXAM: General: Well appearing. No distress on RA Cardiac: JVP ***. S1 and S2 present. No murmurs or rub. Resp: Lung sounds clear and equal B/L Abdomen: Soft, non-tender, non-distended.  Extremities: Warm and dry.  *** edema.  Neuro: Alert and oriented x3. Affect pleasant. Moves all extremities without difficulty.  ASSESSMENT & PLAN:  1. Chronic systolic CHF: New onset cardiomyopathy 5/22,  nonischemic cardiomyopathy. Echo in 5/22 with EF 20-25%, moderately decreased RV systolic function. L/RHC in 6/22 with preserved cardiac output, no significant coronary disease (nonischemic cardiomyopathy). CMRI with LV EF 21%, RV EF 30%, no myocardial LGE.  No ETOH, no drugs, no FH of CAD or CMP. Consider prior viral myocarditis, effects of diabetes itself, or newly-diagnosed hypothyroidism. Also some concern for tachycardia-mediated CMP given frequent (albeit short) runs of atrial tachycardia.  Echo in 11/22 showed EF up to 40-45%, normal RV.  Echo in 11/24 showed normal EF, 55-60%, with normal RV.   - NYHA class I-II symptoms, not volume overloaded on exam.  - Continue Lasix  40 mg prn.  - Continue Entresto  97/103 mg bid.   - Continue spironolactone  25 mg daily. BMET today.  - Continue Toprol  XL 50 mg bid.  - Continue Jardiance  10 mg daily 2. Type 2 diabetes: Per PCP.  3. Hypothyroidism: On Levoxyl , per PCP.  4. Hyperlipidemia: Unable to tolerate statin. Refer for Repatha *** 5. Atrial tachycardia: Frequent episodes noted during 5/22-6/22 admission, recurred when amiodarone  stopped.  Concerned that she could have a tachy-mediated CMP from AT with improvement in EF with decreased AT on medication regimen.  Zio monitor in 2/24 showed 9.2% PACs, 1374 AT runs (longest 4 minutes).  No recent palpitations.  - Continue Toprol  XL 50 mg bid.  6. Obesity: She is working on weight loss through dietary changes and increased activity. There is no height or weight on file to calculate BMI.   - She did not tolerate semaglutide  or tirzepatide .   Follow up in *** with ***  [place] Christana Angelica, NP 06/24/2024

## 2024-06-26 ENCOUNTER — Telehealth (HOSPITAL_COMMUNITY): Payer: Self-pay

## 2024-06-26 NOTE — Telephone Encounter (Signed)
 Error

## 2024-06-26 NOTE — Telephone Encounter (Signed)
 Called to confirm/remind patient of their appointment at the [facility] on 06/29/24 1:30.   Appointment:   [] Confirmed  [x] Left mess   [] No answer/No voice mail  [] VM Full/unable to leave message  [] Phone not in service  Patient reminded to bring all medications and/or complete list.  Confirmed patient has transportation. Gave directions, instructed to utilize valet parking.

## 2024-06-29 ENCOUNTER — Encounter (HOSPITAL_COMMUNITY)

## 2024-07-08 ENCOUNTER — Telehealth (HOSPITAL_COMMUNITY): Payer: Self-pay | Admitting: Cardiology

## 2024-08-10 ENCOUNTER — Ambulatory Visit (INDEPENDENT_AMBULATORY_CARE_PROVIDER_SITE_OTHER): Admitting: Family Medicine

## 2024-08-10 VITALS — BP 119/81 | HR 87 | Ht 59.0 in | Wt 283.8 lb

## 2024-08-10 DIAGNOSIS — E119 Type 2 diabetes mellitus without complications: Secondary | ICD-10-CM

## 2024-08-10 DIAGNOSIS — Z0000 Encounter for general adult medical examination without abnormal findings: Secondary | ICD-10-CM

## 2024-08-10 DIAGNOSIS — Z1231 Encounter for screening mammogram for malignant neoplasm of breast: Secondary | ICD-10-CM

## 2024-08-10 DIAGNOSIS — E038 Other specified hypothyroidism: Secondary | ICD-10-CM

## 2024-08-10 DIAGNOSIS — Z6841 Body Mass Index (BMI) 40.0 and over, adult: Secondary | ICD-10-CM | POA: Diagnosis not present

## 2024-08-10 LAB — POCT GLYCOSYLATED HEMOGLOBIN (HGB A1C): HbA1c, POC (controlled diabetic range): 7 % (ref 0.0–7.0)

## 2024-08-10 MED ORDER — METFORMIN HCL ER 500 MG PO TB24: 500.0000 mg | ORAL_TABLET | Freq: Two times a day (BID) | ORAL | 5 refills | Status: AC

## 2024-08-10 MED ORDER — SPIRONOLACTONE 25 MG PO TABS: 25.0000 mg | ORAL_TABLET | Freq: Every day | ORAL | 0 refills | Status: DC

## 2024-08-10 NOTE — Patient Instructions (Signed)
 Thank you for visiting clinic today and allowing us  to participate in your care!  Today we collected some routine labwork and will let you know of the results when they return.   We replaced the referral to the Healthy Weight clinic today. Someone should reach out in the next few weeks to schedule an appointment.   Please call the [facility] at [phone] to schedule your mammogram at your earliest convenience.   Please see your eye doctor for your annual diabetes eye exam.   Please schedule an appointment in 6 months for follow up.   Reach out any time with any questions or concerns you may have - we are here for you!  Damien Cassis, MD [facility] [phone]

## 2024-08-10 NOTE — Progress Notes (Unsigned)
    SUBJECTIVE:   Chief compliant/HPI: annual examination  Vickie Graham is a 48 y.o. who presents today for an annual exam.   Recently out of metformin  and spironolactone . Feels some increased fluid in belly, no significant change in breathing, no orthopnea, no chest pain.   History reviewed. PMH includes CHF, T2DM, HFrEF, HLD.   OBJECTIVE:   BP 119/81   Pulse 87   Ht 4' 11 (1.499 m)   Wt 283 lb 12.8 oz (128.7 kg)   SpO2 97%   BMI 57.32 kg/m   General: Well-appearing. Resting comfortably in room. CV: Normal S1/S2. No extra heart sounds. Warm and well-perfused. Pulm: Breathing comfortably on room air. CTAB. No increased WOB. Abd: Soft, non-tender, non-distended. Skin:  Warm, dry. Bilateral feet without skin breakdown or ulceration.  Psych: Pleasant and appropriate.    ASSESSMENT/PLAN:   Assessment & Plan Type 2 diabetes mellitus without complication, without long-term current use of insulin  (HCC) A1c well controlled today at 7.0. Continue Metformin  500 BID. Collect BMP, UACR, Lipid panel today. Foot exam unremarkable today. Encouraged annual ophthalmology diabetes eye exam.   Other specified hypothyroidism Patient reports being out of Synthroid  for some time now. Recheck TSH today.  BMI 50.0-59.9, adult [facility]) Previous referral to Healthy Weight clinic expired. Replaced referral.   Annual Examination  See AVS for age appropriate recommendations.   PHQ score 3, reviewed and discussed.  Blood pressure reviewed and at goal.  Patient reports feeling safe at home and in relationship.   Considered the following items based upon USPSTF recommendations: Diabetes care as above. HIV testing:recently completed and result reviewed, normal  in 2022 Hepatitis C antibody: ordered Hepatitis B antibody:ordered Syphilis if at high risk: discussed and declined GC/CT discussed and declined Lipid panel ordered.    Discussed family history, BRCA testing not indicated.  Cervical  cancer screening: prior Pap reviewed, repeat due in 2028 Breast cancer screening: ordered Colorectal cancer screening: Discussed, patient declines at this time.   Follow up in 6 months.  MyChart Activation: Already signed up  Damien Cassis, MD [facility]

## 2024-08-11 ENCOUNTER — Ambulatory Visit (HOSPITAL_COMMUNITY)

## 2024-08-11 LAB — MICROALBUMIN / CREATININE URINE RATIO
Creatinine, Urine: 72.8 mg/dL
Microalb/Creat Ratio: 8 mg/g{creat} (ref 0–29)
Microalbumin, Urine: 5.8 ug/mL

## 2024-08-11 LAB — LIPID PANEL
Chol/HDL Ratio: 6.9 ratio — ABNORMAL HIGH (ref 0.0–4.4)
Cholesterol, Total: 255 mg/dL — ABNORMAL HIGH (ref 100–199)
HDL: 37 mg/dL — ABNORMAL LOW (ref >39)
LDL Chol Calc (NIH): 113 mg/dL — ABNORMAL HIGH (ref 0–99)
Triglycerides: 594 mg/dL (ref 0–149)
VLDL Cholesterol Cal: 105 mg/dL — ABNORMAL HIGH (ref 5–40)

## 2024-08-11 LAB — BASIC METABOLIC PANEL WITH GFR
BUN/Creatinine Ratio: 21 (ref 9–23)
BUN: 15 mg/dL (ref 6–24)
CO2: 19 mmol/L — ABNORMAL LOW (ref 20–29)
Calcium: 9.5 mg/dL (ref 8.7–10.2)
Chloride: 103 mmol/L (ref 96–106)
Creatinine, Ser: 0.73 mg/dL (ref 0.57–1.00)
Glucose: 191 mg/dL — ABNORMAL HIGH (ref 70–99)
Potassium: 4.5 mmol/L (ref 3.5–5.2)
Sodium: 138 mmol/L (ref 134–144)
eGFR: 102 mL/min/1.73 (ref >59)

## 2024-08-11 LAB — HEPATITIS C ANTIBODY: Hep C Virus Ab: NONREACTIVE

## 2024-08-11 LAB — HEPATITIS B SURFACE ANTIBODY, QUANTITATIVE: Hepatitis B Surf Ab Quant: <3.5 m[IU]/mL — ABNORMAL LOW

## 2024-08-11 LAB — TSH: TSH: 11 u[IU]/mL — ABNORMAL HIGH (ref 0.450–4.500)

## 2024-08-11 NOTE — Assessment & Plan Note (Signed)
 A1c well controlled today at 7.0. Continue Metformin  500 BID. Collect BMP, UACR, Lipid panel today. Foot exam unremarkable today. Encouraged annual ophthalmology diabetes eye exam.

## 2024-08-11 NOTE — Assessment & Plan Note (Signed)
 Patient reports being out of Synthroid  for some time now. Recheck TSH today.

## 2024-08-12 ENCOUNTER — Ambulatory Visit: Payer: Self-pay | Admitting: Family Medicine

## 2024-08-12 MED ORDER — ICOSAPENT ETHYL 1 G PO CAPS: 2.0000 g | ORAL_CAPSULE | Freq: Two times a day (BID) | ORAL | 3 refills | Status: AC

## 2024-08-18 ENCOUNTER — Telehealth (HOSPITAL_COMMUNITY): Payer: Self-pay

## 2024-08-18 NOTE — Progress Notes (Incomplete)
 ADVANCED HF CLINIC NOTE  Primary Care: Dr. Romualdo Dixons Primary HF Cardiologist: Dr. Rolan  HPI: 48 y.o. female w/ h/o systolic HF/nonischemic CM, obesity, ADHD, anxiety, depression, hypothyroidism, GERD, PACs, DM2.  Presented to ED on 02/13/21 w/ complaints of 3 week h/o progressive DOE, LEE and abdominal fullness. Found to be in acute CHF. BNP 1388. CXR w/ mild congestive CHF w/ interstitial edema. EKG showed ST 120 bpm. HS trop 48>>50.  TSH 13.416. Free T4 WNL. Chest CT negative for PE. She was admitted for CHF and started on IV Lasix . Echo showed biventricular dysfunction. LVEF severely reduced 20-25% w/ global HK. Mild LVH. RV moderately reduced w/ estimated RVSP 36 mmHg. Also found to have new diagnosis of T2D, Hgb A1c 11. She responded well to diuresis and was transitioned to oral Lasix  and discharged on GDMT.  Seen in ED on 06/13/21 with fatigue. Fluoxetine  and metformin  recently increased at PCP visit 09/16. Chest x-ray with evidence of mild interstitial edema. It was recommended to increase lasix  to 80 mg daily for 3 days. Declined COVID test as had negative result at home. No labs or ECG done.    Echo in 10/22 showed EF 40-45%, mild LV enlargement, normal RV, normal IVC.  Zio monitor in 2/24 showed 9.2% PACs, 1374 AT runs (longest 4 minutes).   Echo in 11/24 showed EF up to 55-60%, normal RV, normal IVC.   She returns today for followup of CHF.  No palpitations.  No significant exertional dyspnea or chest pain.  No lightheadedness. Weight is unchanged, she did not tolerate tirzepatide  due to diarrhea.  No orthopnea/PND. No palpitations.  She still takes Lasix  one or two times/week.   ECG (personally reviewed):   Labs (9/22): TSH mildly elevated, K 4, creatinine 1.07, LFTs normal Labs (12/22): K 3.5, creatinine 0.86 Labs (4/24): BNP 92, TSH < 0.010 Labs (10/24): K 4.5, creatinine 0.8  PMH: 1. Type 2 diabetes 2. ADHD 3. HTN 4. Depression 5. GERD 6. Obesity 7. Chronic  systolic CHF:  Nonischemic cardiomyopathy.  - Echo (5/22) with EF 20-25%, global HK, moderately decreased RV systolic function.  - RHC/LHC: No significant CAD; mean RA 6, PA 53/20 mean 30, mean PCWP 20, CI (Fick) 2.74, PVR 1.7 WU.  - Cardiac MRI (6/22): Mildly dilated LV with EF 21%, RV EF 30%, no definite LGE (difficult images).  - Echo (10/22) with EF 40-45%, mild LV enlargement, normal RV.  - Echo (11/24): EF up to 55-60%, normal RV, normal IVC. 8. Hyperthyroidism => hypothyroidism.  9. Atrial tachycardia - Zio (2/24): 9.2% PACs, 1374 AT runs (longest 4 minutes) 10. Hyperlipidemia.   ROS: All systems reviewed and negative except as per HPI.   FH: No family history of cardiomyopathy.    Current Outpatient Medications  Medication Sig Dispense Refill   acetaminophen  (TYLENOL ) 325 MG tablet Take 2 tablets (650 mg total) by mouth every 6 (six) hours as needed for mild pain.     empagliflozin  (JARDIANCE ) 10 MG TABS tablet Take 1 tablet (10 mg total) by mouth daily. 30 tablet 11   FLUoxetine  (PROZAC ) 40 MG capsule Take 1 capsule by mouth once daily 90 capsule 1   fluticasone (FLONASE) 50 MCG/ACT nasal spray Place 1 spray into both nostrils 2 (two) times daily as needed for allergies or rhinitis.     furosemide  (LASIX ) 40 MG tablet Take 1 tablet (40 mg total) by mouth as needed for fluid or edema (AS DIRECTED). 30 tablet 3   icosapent  Ethyl (VASCEPA )  1 g capsule Take 2 capsules (2 g total) by mouth 2 (two) times daily. 120 capsule 3   levothyroxine  (SYNTHROID ) 25 MCG tablet Take 1 tablet (25 mcg total) by mouth daily before breakfast. (Patient not taking: Reported on 11/01/2023) 90 tablet 3   loratadine (CLARITIN) 10 MG tablet Take 10 mg by mouth daily as needed for allergies or rhinitis.     metFORMIN  (GLUCOPHAGE -XR) 500 MG 24 hr tablet Take 1 tablet (500 mg total) by mouth 2 (two) times daily. 90 tablet 5   metoprolol  succinate (TOPROL -XL) 50 MG 24 hr tablet TAKE 1 TABLET BY MOUTH IN THE  MORNING AND AT BEDTIME .  TAKE  WITH  OR  IMMEDIATELY  FOLLOWING  A  MEAL. 60 tablet 1   metoprolol  tartrate (LOPRESSOR ) 25 MG tablet Take 1 tablet when you have palpitations 30 tablet 1   potassium chloride  SA (KLOR-CON  M) 20 MEQ tablet Take 20 mEq by mouth as needed.     sacubitril -valsartan  (ENTRESTO ) 97-103 MG Take 1 tablet by mouth twice daily 60 tablet 0   spironolactone  (ALDACTONE ) 25 MG tablet Take 1 tablet (25 mg total) by mouth daily. 30 tablet 0   No current facility-administered medications for this visit.   Allergies  Allergen Reactions   Statins Shortness Of Breath and Other (See Comments)    Fatigue, also   Sulfa  Antibiotics Other (See Comments)    Reaction not known   Latex Rash   Social History   Socioeconomic History   Marital status: Married    Spouse name: Not on file   Number of children: Not on file   Years of education: Not on file   Highest education level: Associate degree: academic program  Occupational History   Not on file  Tobacco Use   Smoking status: Former    Current packs/day: 0.00    Types: Cigarettes    Quit date: 01/12/2010    Years since quitting: 14.6   Smokeless tobacco: Never  Vaping Use   Vaping status: Never Used  Substance and Sexual Activity   Alcohol use: No   Drug use: No   Sexual activity: Not on file  Other Topics Concern   Not on file  Social History Narrative   Marital status: divorced and engaged since 2011.     Children: 2 children      Lives with fiance and two children (20, 10)      Employment: childcare early development; 3s and 4s. Chiropodist      Tobacco: none      Alcohol: none      Drugs; None      Exercise: sporadic.           Social Drivers of Corporate Investment Banker Strain: Low Risk  (08/10/2024)   Overall Financial Resource Strain (CARDIA)    Difficulty of Paying Living Expenses: Not hard at all  Food Insecurity: No Food Insecurity (08/10/2024)   Hunger Vital Sign    Worried About  Running Out of Food in the Last Year: Never true    Ran Out of Food in the Last Year: Never true  Transportation Needs: No Transportation Needs (08/10/2024)   PRAPARE - Administrator, Civil Service (Medical): No    Lack of Transportation (Non-Medical): No  Physical Activity: Insufficiently Active (08/10/2024)   Exercise Vital Sign    Days of Exercise per Week: 1 day    Minutes of Exercise per Session: 30 min  Stress: Stress  Concern Present (08/10/2024)   [employer] of Occupational Health - Occupational Stress Questionnaire    Feeling of Stress: Rather much  Social Connections: Moderately Integrated (08/10/2024)   Social Connection and Isolation Panel    Frequency of Communication with Friends and Family: Never    Frequency of Social Gatherings with Friends and Family: Never    Attends Religious Services: More than 4 times per year    Active Member of [employer] or Organizations: Yes    Attends Banker Meetings: More than 4 times per year    Marital Status: Married  Catering Manager Violence: Unknown (12/21/2021)   Received from [facility]   HITS    Physically Hurt: Not on file    Insult or Talk Down To: Not on file    Threaten Physical Harm: Not on file    Scream or Curse: Not on file    There were no vitals taken for this visit.  Wt Readings from Last 3 Encounters:  08/10/24 128.7 kg (283 lb 12.8 oz)  11/01/23 128 kg (282 lb 4 oz)  10/30/23 124.7 kg (275 lb)   PHYSICAL EXAM: General: NAD Neck: No JVD, no thyromegaly or thyroid  nodule.  Lungs: Clear to auscultation bilaterally with normal respiratory effort. CV: Nondisplaced PMI.  Heart regular S1/S2, no S3/S4, no murmur.  No peripheral edema.  No carotid bruit.  Normal pedal pulses.  Abdomen: Soft, nontender, no hepatosplenomegaly, no distention.  Skin: Intact without lesions or rashes.  Neurologic: Alert and oriented x 3.  Psych: Normal affect. Extremities: No clubbing or cyanosis.  HEENT:  Normal.   ASSESSMENT & PLAN:  1. Chronic systolic CHF:  New onset cardiomyopathy 5/22, nonischemic cardiomyopathy. Echo in 5/22 with EF 20-25%, moderately decreased RV systolic function.  LHC/RHC in 6/22 with preserved cardiac output, no significant coronary disease (nonischemic cardiomyopathy). Cardiac MRI with LV EF 21%, RV EF 30%, no myocardial LGE.  No ETOH, no drugs, no FH of CAD or CMP. Consider prior viral myocarditis, effects of diabetes itself, or newly-diagnosed hypothyroidism.  Also some concern for tachycardia-mediated CMP given frequent (albeit short) runs of atrial tachycardia.  Echo in 11/22 showed EF up to 40-45%, normal RV.  Echo in 11/24 showed normal EF, 55-60%, with normal RV.  NYHA class I-II symptoms, not volume overloaded on exam.  - Continue Lasix  40 mg prn.  - Continue Entresto  97/103 mg bid.   - Continue spironolactone  25 mg daily. BMET today.  - Continue Toprol  XL 50 mg bid.  - Continue Jardiance  10 mg daily 2. Type 2 diabetes: Per PCP.  3. Hypothyroidism: On Levoxyl , per PCP.  4. Hyperlipidemia: Unable to tolerate statin.  5. Atrial tachycardia: Frequent episodes noted during 5/22-6/22 admission, recurred when amiodarone  stopped.  I am concerned that she could have a tachy-mediated CMP from AT with improvement in EF with decreased AT on medication regimen.  Zio monitor in 2/24 showed 9.2% PACs, 1374 AT runs (longest 4 minutes).  No recent palpitations.  - Continue Toprol  XL 50 mg bid.  6. Obesity: She is working on weight loss through dietary changes and increased activity. There is no height or weight on file to calculate BMI.  - She did not tolerate semaglutide  or tirzepatide .   Followup   [facility], Amylah Will N 08/18/2024

## 2024-08-18 NOTE — Telephone Encounter (Signed)
 Called to confirm/remind patient of their appointment at the [facility] on 12/3/251:30.   Appointment:   [] Confirmed  [x] Left mess   [] No answer/No voice mail  [] VM Full/unable to leave message  [] Phone not in service  Patient reminded to bring all medications and/or complete list.  Confirmed patient has transportation. Gave directions, instructed to utilize valet parking.

## 2024-08-19 ENCOUNTER — Encounter (HOSPITAL_COMMUNITY): Payer: Self-pay

## 2024-08-19 ENCOUNTER — Other Ambulatory Visit (HOSPITAL_COMMUNITY): Payer: Self-pay | Admitting: Physician Assistant

## 2024-08-19 ENCOUNTER — Ambulatory Visit (HOSPITAL_COMMUNITY)
Admission: RE | Admit: 2024-08-19 | Discharge: 2024-08-19 | Disposition: A | Source: Ambulatory Visit | Attending: Physician Assistant

## 2024-08-19 ENCOUNTER — Ambulatory Visit (HOSPITAL_COMMUNITY)
Admission: RE | Admit: 2024-08-19 | Discharge: 2024-08-19 | Disposition: A | Source: Ambulatory Visit | Attending: Physician Assistant | Admitting: Physician Assistant

## 2024-08-19 VITALS — BP 98/62 | HR 72 | Ht 59.0 in | Wt 282.8 lb

## 2024-08-19 DIAGNOSIS — I4719 Other supraventricular tachycardia: Secondary | ICD-10-CM

## 2024-08-19 DIAGNOSIS — E66813 Obesity, class 3: Secondary | ICD-10-CM

## 2024-08-19 DIAGNOSIS — E782 Mixed hyperlipidemia: Secondary | ICD-10-CM | POA: Diagnosis not present

## 2024-08-19 DIAGNOSIS — Z6841 Body Mass Index (BMI) 40.0 and over, adult: Secondary | ICD-10-CM

## 2024-08-19 DIAGNOSIS — I5022 Chronic systolic (congestive) heart failure: Secondary | ICD-10-CM

## 2024-08-19 DIAGNOSIS — I428 Other cardiomyopathies: Secondary | ICD-10-CM

## 2024-08-19 NOTE — Patient Instructions (Signed)
 Medication Changes:  No Changes In Medications at this time.   Testing/Procedures:  Your provider has recommended that  you wear a Zio Patch for 14 days.  This monitor will record your heart rhythm for our review.  IF you have any symptoms while wearing the monitor please press the button.  If you have any issues with the patch or you notice a red or orange light on it please call the company at [phone].  Once you remove the patch please mail it back to the company as soon as possible so we can get the results.  ECHOCARDIOGRAM AS SCHEDULED   Follow-Up in: 4 MONTHS WITH DR. ROLAN PLEASE CALL OUR OFFICE AROUND FEBRUARY  TO GET SCHEDULED FOR YOUR APPOINTMENT. PHONE NUMBER IS [phone] OPTION 2   At the [facility], you and your health needs are our priority. We have a designated team specialized in the treatment of Heart Failure. This Care Team includes your primary Heart Failure Specialized Cardiologist (physician), Advanced Practice Providers (APPs- Physician Assistants and Nurse Practitioners), and Pharmacist who all work together to provide you with the care you need, when you need it.   You may see any of the following providers on your designated Care Team at your next follow up:  Dr. Toribio Fuel Dr. Ezra Rolan Dr. Odis Brownie Greig Mosses, NP Caffie Shed, [place] [facility] [place], [place] Beckey Coe, NP Jordan Lee, NP Tinnie Redman, PharmD   Please be sure to bring in all your medications bottles to every appointment.   Need to Contact Us :  If you have any questions or concerns before your next appointment please send us  a message through [place] or call our office at [phone].    TO LEAVE A MESSAGE FOR THE NURSE SELECT OPTION 2, PLEASE LEAVE A MESSAGE INCLUDING: YOUR NAME DATE OF BIRTH CALL BACK NUMBER REASON FOR CALL**this is important as we prioritize the call backs  YOU WILL RECEIVE A CALL BACK THE SAME DAY AS LONG AS YOU  CALL BEFORE 4:00 PM

## 2024-08-21 ENCOUNTER — Other Ambulatory Visit (HOSPITAL_COMMUNITY): Payer: Self-pay | Admitting: Cardiology

## 2024-08-21 ENCOUNTER — Other Ambulatory Visit: Payer: Self-pay | Admitting: Family Medicine

## 2024-08-21 DIAGNOSIS — F419 Anxiety disorder, unspecified: Secondary | ICD-10-CM

## 2024-08-24 ENCOUNTER — Ambulatory Visit (HOSPITAL_COMMUNITY)
Admission: RE | Admit: 2024-08-24 | Discharge: 2024-08-24 | Attending: Physician Assistant | Admitting: Physician Assistant

## 2024-08-24 DIAGNOSIS — I5022 Chronic systolic (congestive) heart failure: Secondary | ICD-10-CM

## 2024-08-24 LAB — ECHOCARDIOGRAM COMPLETE
AR max vel: 1.71 cm2
AV Area VTI: 1.71 cm2
AV Area mean vel: 1.43 cm2
AV Mean grad: 7 mmHg
AV Peak grad: 13 mmHg
Ao pk vel: 1.8 m/s
Area-P 1/2: 4.19 cm2
Calc EF: 57.4 %
S' Lateral: 3.4 cm
Single Plane A2C EF: 53.7 %
Single Plane A4C EF: 60.2 %

## 2024-08-25 ENCOUNTER — Ambulatory Visit (HOSPITAL_COMMUNITY): Payer: Self-pay | Admitting: Physician Assistant

## 2024-09-22 ENCOUNTER — Ambulatory Visit: Payer: Self-pay | Admitting: Family Medicine

## 2024-09-22 NOTE — Addendum Note (Signed)
 Encounter addended by: Debarah Garrison MATSU, RN on: 09/22/2024 1:32 PM  Actions taken: Imaging Exam ended

## 2024-09-28 ENCOUNTER — Ambulatory Visit (HOSPITAL_COMMUNITY): Payer: Self-pay | Admitting: Physician Assistant

## 2024-09-30 MED ORDER — METOPROLOL SUCCINATE ER 50 MG PO TB24: 75.0000 mg | ORAL_TABLET | Freq: Every day | ORAL | 3 refills | Status: AC

## 2024-10-11 ENCOUNTER — Other Ambulatory Visit: Payer: Self-pay | Admitting: Cardiology
